# Patient Record
Sex: Female | Born: 1980
Health system: Southern US, Community
[De-identification: ages and names within clinical notes are randomized; demographics above are authoritative.]

## PROBLEM LIST (undated history)

## (undated) ENCOUNTER — Inpatient Hospital Stay (HOSPITAL_COMMUNITY): Payer: Self-pay

## (undated) DIAGNOSIS — E669 Obesity, unspecified: Secondary | ICD-10-CM

## (undated) DIAGNOSIS — Z8614 Personal history of Methicillin resistant Staphylococcus aureus infection: Secondary | ICD-10-CM

## (undated) DIAGNOSIS — B009 Herpesviral infection, unspecified: Secondary | ICD-10-CM

## (undated) DIAGNOSIS — O1490 Unspecified pre-eclampsia, unspecified trimester: Secondary | ICD-10-CM

## (undated) DIAGNOSIS — K589 Irritable bowel syndrome without diarrhea: Secondary | ICD-10-CM

## (undated) DIAGNOSIS — O139 Gestational [pregnancy-induced] hypertension without significant proteinuria, unspecified trimester: Secondary | ICD-10-CM

## (undated) DIAGNOSIS — L739 Follicular disorder, unspecified: Secondary | ICD-10-CM

## (undated) DIAGNOSIS — K219 Gastro-esophageal reflux disease without esophagitis: Secondary | ICD-10-CM

## (undated) DIAGNOSIS — M25539 Pain in unspecified wrist: Secondary | ICD-10-CM

## (undated) DIAGNOSIS — G43009 Migraine without aura, not intractable, without status migrainosus: Secondary | ICD-10-CM

## (undated) DIAGNOSIS — K760 Fatty (change of) liver, not elsewhere classified: Secondary | ICD-10-CM

## (undated) DIAGNOSIS — O4100X0 Oligohydramnios, unspecified trimester, not applicable or unspecified: Secondary | ICD-10-CM

## (undated) HISTORY — DX: Obesity, unspecified: E66.9

## (undated) HISTORY — DX: Unspecified pre-eclampsia, unspecified trimester: O14.90

## (undated) HISTORY — DX: Irritable bowel syndrome, unspecified: K58.9

## (undated) HISTORY — DX: Herpesviral infection, unspecified: B00.9

## (undated) HISTORY — DX: Follicular disorder, unspecified: L73.9

## (undated) HISTORY — PX: SEPTOPLASTY: SUR1290

## (undated) HISTORY — PX: WISDOM TOOTH EXTRACTION: SHX21

## (undated) HISTORY — DX: Migraine without aura, not intractable, without status migrainosus: G43.009

## (undated) HISTORY — DX: Oligohydramnios, unspecified trimester, not applicable or unspecified: O41.00X0

## (undated) HISTORY — DX: Pain in unspecified wrist: M25.539

---

## 2007-06-21 ENCOUNTER — Encounter: Admission: RE | Admit: 2007-06-21 | Discharge: 2007-06-21 | Payer: Self-pay | Admitting: Gastroenterology

## 2007-06-26 ENCOUNTER — Ambulatory Visit (HOSPITAL_COMMUNITY): Admission: RE | Admit: 2007-06-26 | Discharge: 2007-06-26 | Payer: Self-pay | Admitting: Gastroenterology

## 2007-06-27 ENCOUNTER — Emergency Department (HOSPITAL_COMMUNITY): Admission: EM | Admit: 2007-06-27 | Discharge: 2007-06-27 | Payer: Self-pay | Admitting: Emergency Medicine

## 2008-08-22 ENCOUNTER — Encounter: Admission: RE | Admit: 2008-08-22 | Discharge: 2008-08-22 | Payer: Self-pay | Admitting: Family Medicine

## 2009-03-23 DIAGNOSIS — B009 Herpesviral infection, unspecified: Secondary | ICD-10-CM | POA: Insufficient documentation

## 2009-03-23 HISTORY — DX: Herpesviral infection, unspecified: B00.9

## 2009-04-01 DIAGNOSIS — E669 Obesity, unspecified: Secondary | ICD-10-CM | POA: Insufficient documentation

## 2009-04-01 DIAGNOSIS — J45990 Exercise induced bronchospasm: Secondary | ICD-10-CM | POA: Insufficient documentation

## 2009-07-02 ENCOUNTER — Encounter: Admission: RE | Admit: 2009-07-02 | Discharge: 2009-07-02 | Payer: Self-pay | Admitting: Nurse Practitioner

## 2009-07-02 DIAGNOSIS — R635 Abnormal weight gain: Secondary | ICD-10-CM | POA: Insufficient documentation

## 2009-07-16 DIAGNOSIS — R519 Headache, unspecified: Secondary | ICD-10-CM | POA: Insufficient documentation

## 2009-07-23 ENCOUNTER — Encounter: Admission: RE | Admit: 2009-07-23 | Discharge: 2009-07-23 | Payer: Self-pay | Admitting: Family Medicine

## 2009-11-04 DIAGNOSIS — E559 Vitamin D deficiency, unspecified: Secondary | ICD-10-CM | POA: Insufficient documentation

## 2010-02-22 DIAGNOSIS — J45909 Unspecified asthma, uncomplicated: Secondary | ICD-10-CM | POA: Insufficient documentation

## 2010-08-06 LAB — RPR: RPR: NONREACTIVE

## 2010-08-06 LAB — HEPATITIS B SURFACE ANTIGEN: Hepatitis B Surface Ag: NEGATIVE

## 2010-08-06 LAB — GC/CHLAMYDIA PROBE AMP, GENITAL: Chlamydia: NEGATIVE

## 2010-10-21 LAB — URINALYSIS, ROUTINE W REFLEX MICROSCOPIC
Glucose, UA: NEGATIVE
Hgb urine dipstick: NEGATIVE
Ketones, ur: NEGATIVE
Nitrite: NEGATIVE
Protein, ur: NEGATIVE

## 2010-10-21 LAB — POCT PREGNANCY, URINE: Preg Test, Ur: NEGATIVE

## 2011-01-25 DIAGNOSIS — Z8614 Personal history of Methicillin resistant Staphylococcus aureus infection: Secondary | ICD-10-CM

## 2011-01-25 HISTORY — DX: Personal history of Methicillin resistant Staphylococcus aureus infection: Z86.14

## 2011-02-07 LAB — STREP B DNA PROBE: GBS: POSITIVE

## 2011-02-19 ENCOUNTER — Inpatient Hospital Stay (HOSPITAL_COMMUNITY)
Admission: AD | Admit: 2011-02-19 | Discharge: 2011-02-20 | Disposition: A | Discharge status: Home / Self Care | Payer: BC Managed Care – PPO | Source: Ambulatory Visit | Attending: Obstetrics and Gynecology | Admitting: Obstetrics and Gynecology

## 2011-02-19 ENCOUNTER — Inpatient Hospital Stay (HOSPITAL_COMMUNITY): Payer: BC Managed Care – PPO

## 2011-02-19 ENCOUNTER — Encounter (HOSPITAL_COMMUNITY): Payer: Self-pay | Admitting: *Deleted

## 2011-02-19 DIAGNOSIS — R51 Headache: Secondary | ICD-10-CM | POA: Insufficient documentation

## 2011-02-19 DIAGNOSIS — O4100X0 Oligohydramnios, unspecified trimester, not applicable or unspecified: Secondary | ICD-10-CM | POA: Insufficient documentation

## 2011-02-19 DIAGNOSIS — O99891 Other specified diseases and conditions complicating pregnancy: Secondary | ICD-10-CM | POA: Insufficient documentation

## 2011-02-19 DIAGNOSIS — R030 Elevated blood-pressure reading, without diagnosis of hypertension: Secondary | ICD-10-CM | POA: Insufficient documentation

## 2011-02-19 LAB — CBC
Hemoglobin: 11.9 g/dL — ABNORMAL LOW (ref 12.0–15.0)
MCHC: 34.5 g/dL (ref 30.0–36.0)
MCV: 91.5 fL (ref 78.0–100.0)
Platelets: 174 10*3/uL (ref 150–400)
WBC: 9.7 10*3/uL (ref 4.0–10.5)

## 2011-02-19 LAB — COMPREHENSIVE METABOLIC PANEL
ALT: 37 U/L — ABNORMAL HIGH (ref 0–35)
AST: 34 U/L (ref 0–37)
Albumin: 2.3 g/dL — ABNORMAL LOW (ref 3.5–5.2)
BUN: 7 mg/dL (ref 6–23)
Calcium: 8.6 mg/dL (ref 8.4–10.5)
Chloride: 104 mEq/L (ref 96–112)
GFR calc Af Amer: >90 mL/min (ref >90)
GFR calc non Af Amer: >90 mL/min (ref >90)
Glucose, Bld: 118 mg/dL — ABNORMAL HIGH (ref 70–99)
Sodium: 134 mEq/L — ABNORMAL LOW (ref 135–145)

## 2011-02-19 LAB — URINALYSIS, ROUTINE W REFLEX MICROSCOPIC
Glucose, UA: NEGATIVE mg/dL
Hgb urine dipstick: NEGATIVE
Nitrite: NEGATIVE
Specific Gravity, Urine: <1.005 — ABNORMAL LOW (ref 1.005–1.030)
Urobilinogen, UA: 0.2 mg/dL (ref 0.0–1.0)
pH: 5.5 (ref 5.0–8.0)

## 2011-02-19 MED ORDER — GI COCKTAIL ~~LOC~~
30.0000 mL | Freq: Once | ORAL | Status: AC
  Administered 2011-02-19: 30 mL via ORAL
  Filled 2011-02-19: qty 30

## 2011-02-19 MED ORDER — ACETAMINOPHEN-CODEINE #3 300-30 MG PO TABS
1.0000 | ORAL_TABLET | Freq: Once | ORAL | Status: AC
  Administered 2011-02-19: 1 via ORAL
  Filled 2011-02-19: qty 1

## 2011-02-19 NOTE — ED Notes (Signed)
W.Muhammed CNM notified of pt's admission and status. Will see pt 

## 2011-02-19 NOTE — Progress Notes (Signed)
To L side per pt request. States GI cocktail helped alittle.

## 2011-02-19 NOTE — Progress Notes (Signed)
At 2100 there was a fetal decel to the 80's lasting approximately two minutes; resolved with placing pt on right side.

## 2011-02-19 NOTE — Progress Notes (Signed)
Up to BR. Threasa Heads CNM in to discuss lab results. Will get BPP.

## 2011-02-19 NOTE — Progress Notes (Signed)
Back to semifowlers. Caused pain on R side when lying on R side and difficult monitoring FHR on L side.

## 2011-02-19 NOTE — Progress Notes (Signed)
To us via wc.

## 2011-02-19 NOTE — Progress Notes (Signed)
Pt states, " I got a headache mid morning and it gradually got worse. I thought my pressure might be up and when EMS checked, it was 152/98 at 6:30 pm. I have been swelling more for the past few days. The office has been watching me for pre eclampsia."

## 2011-02-19 NOTE — ED Provider Notes (Signed)
History     Chief Complaint  Patient presents with  . Headache  . Hypertension   HPI Pt states, " I got a headache mid morning and it gradually got worse. I thought my pressure might be up and when EMS checked, it was 152/98 at 6:30 pm. I have been swelling more for the past few days. The office has been watching me for pre eclampsia." Also reports right sided epigastric pain that increased in intensity over past three days.  +nausea.  Intermittent contractions, with no report of vaginal bleeding or leaking of fluid.     Past Medical History  Diagnosis Date  . Asthma     History reviewed. No pertinent past surgical history.  Family History  Problem Relation Age of Onset  . Anesthesia problems Neg Hx   . Malignant hyperthermia Neg Hx   . Pseudochol deficiency Neg Hx   . Hypotension Neg Hx     History  Substance Use Topics  . Smoking status: Never Smoker   . Smokeless tobacco: Not on file  . Alcohol Use: No    Allergies: Allergies not on file  No prescriptions prior to admission    Review of Systems  Constitutional: Negative.   Respiratory: Negative.   Cardiovascular: Negative.   Gastrointestinal: Positive for nausea and abdominal pain ( right epigastric; intermittent contractions). Negative for vomiting, diarrhea and constipation.  Neurological: Headaches:  frontal.   Physical Exam   Blood pressure 123/72, pulse 79, temperature 98.5 F (36.9 C), temperature source Oral, resp. rate 20, height 5\' 7"  (1.702 m), weight 122.981 kg (271 lb 2 oz), SpO2 97.00%.  Physical Exam  Constitutional: She is oriented to person, place, and time. She appears well-developed and well-nourished. No distress.  HENT:  Head: Normocephalic.  Eyes: Pupils are equal, round, and reactive to light.  Neck: Normal range of motion. Neck supple.  Cardiovascular: Normal rate, regular rhythm and normal heart sounds.   Respiratory: Effort normal and breath sounds normal.  Genitourinary: No  bleeding around the vagina. Vaginal discharge (mucusy) found.       Cervix 1/50/-3  Musculoskeletal:       2+ pedal edema  Neurological: She is alert and oriented to person, place, and time. She has normal reflexes. She displays normal reflexes.  Skin: Skin is warm and dry.  FHR 120's, +accel, reactive Toco - irregular  MAU Course  Procedures  Results for orders placed during the hospital encounter of 02/19/11 (from the past 24 hour(s))  URINALYSIS, ROUTINE W REFLEX MICROSCOPIC     Status: Abnormal   Collection Time   02/19/11  8:16 PM      Component Value Range   Color, Urine YELLOW  YELLOW    APPearance CLEAR  CLEAR    Specific Gravity, Urine <1.005 (*) 1.005 - 1.030    pH 5.5  5.0 - 8.0    Glucose, UA NEGATIVE  NEGATIVE (mg/dL)   Hgb urine dipstick NEGATIVE  NEGATIVE    Bilirubin Urine NEGATIVE  NEGATIVE    Ketones, ur NEGATIVE  NEGATIVE (mg/dL)   Protein, ur NEGATIVE  NEGATIVE (mg/dL)   Urobilinogen, UA 0.2  0.0 - 1.0 (mg/dL)   Nitrite NEGATIVE  NEGATIVE    Leukocytes, UA NEGATIVE  NEGATIVE   CBC     Status: Abnormal   Collection Time   02/19/11  9:15 PM      Component Value Range   WBC 9.7  4.0 - 10.5 (K/uL)   RBC 3.77 (*) 3.87 -  5.11 (MIL/uL)   Hemoglobin 11.9 (*) 12.0 - 15.0 (g/dL)   HCT 81.1 (*) 91.4 - 46.0 (%)   MCV 91.5  78.0 - 100.0 (fL)   MCH 31.6  26.0 - 34.0 (pg)   MCHC 34.5  30.0 - 36.0 (g/dL)   RDW 78.2  95.6 - 21.3 (%)   Platelets 174  150 - 400 (K/uL)  COMPREHENSIVE METABOLIC PANEL     Status: Abnormal   Collection Time   02/19/11  9:15 PM      Component Value Range   Sodium 134 (*) 135 - 145 (mEq/L)   Potassium 3.8  3.5 - 5.1 (mEq/L)   Chloride 104  96 - 112 (mEq/L)   CO2 23  19 - 32 (mEq/L)   Glucose, Bld 118 (*) 70 - 99 (mg/dL)   BUN 7  6 - 23 (mg/dL)   Creatinine, Ser 0.86  0.50 - 1.10 (mg/dL)   Calcium 8.6  8.4 - 57.8 (mg/dL)   Total Protein 5.5 (*) 6.0 - 8.3 (g/dL)   Albumin 2.3 (*) 3.5 - 5.2 (g/dL)   AST 34  0 - 37 (U/L)   ALT 37 (*) 0 -  35 (U/L)   Alkaline Phosphatase 172 (*) 39 - 117 (U/L)   Total Bilirubin 0.4  0.3 - 1.2 (mg/dL)   GFR calc non Af Amer >90  >90 (mL/min)   GFR calc Af Amer >90  >90 (mL/min)   Ultrasound: BPP 8/8 AFI 6.87  Consulted with Dr. Marcelle Overlie, reviewed HPI/exam/US results>DC home with follow-up on Monday.  Assessment and Plan  Oligohydramnios  Plan: DC home PIH precautions Keep scheduled appt for Monday  [facility] 02/19/2011, 8:40 PM

## 2011-02-19 NOTE — Consult Note (Signed)
Reviewed HPI/labs/exam/fetal tracing>obtain BPP and give Tylenol Extra Strength x 2

## 2011-02-19 NOTE — Progress Notes (Signed)
Pt was flatter for sve and had variable down to 90s. To R side and FHR return to baseline

## 2011-02-19 NOTE — ED Notes (Signed)
Pt states she had blood work Thurs due to elevated B/Ps. Received call from office Friday telling her platelets low and liver enzymes elevated. Having some pain R upper quad that radiates around to back. States had a lot of n/v last wk but ok this wk. Some loose stools today. Some blurry vision at times. Frontal h/a. Took Tylenol 500mg  2 tabs about 1800 which didn't help.

## 2011-02-20 NOTE — Progress Notes (Signed)
Threasa Heads CNM in to discuss lab and u/s results with pt. And d/c plan home.

## 2011-02-23 ENCOUNTER — Encounter (HOSPITAL_COMMUNITY): Payer: Self-pay

## 2011-02-23 ENCOUNTER — Inpatient Hospital Stay (HOSPITAL_COMMUNITY)
Admission: RE | Admit: 2011-02-23 | Discharge: 2011-02-28 | DRG: 372 | Disposition: A | Discharge status: Home / Self Care | Payer: BC Managed Care – PPO | Source: Ambulatory Visit | Attending: Obstetrics and Gynecology | Admitting: Obstetrics and Gynecology

## 2011-02-23 DIAGNOSIS — R209 Unspecified disturbances of skin sensation: Secondary | ICD-10-CM | POA: Diagnosis not present

## 2011-02-23 DIAGNOSIS — O1414 Severe pre-eclampsia complicating childbirth: Principal | ICD-10-CM | POA: Diagnosis present

## 2011-02-23 DIAGNOSIS — O99893 Other specified diseases and conditions complicating puerperium: Secondary | ICD-10-CM | POA: Diagnosis not present

## 2011-02-23 HISTORY — DX: Gestational (pregnancy-induced) hypertension without significant proteinuria, unspecified trimester: O13.9

## 2011-02-23 LAB — CBC
HCT: 35.6 % — ABNORMAL LOW (ref 36.0–46.0)
MCHC: 35.1 g/dL (ref 30.0–36.0)
Platelets: 188 10*3/uL (ref 150–400)
RDW: 13.1 % (ref 11.5–15.5)
WBC: 11.5 10*3/uL — ABNORMAL HIGH (ref 4.0–10.5)

## 2011-02-23 LAB — COMPREHENSIVE METABOLIC PANEL
ALT: 44 U/L — ABNORMAL HIGH (ref 0–35)
AST: 39 U/L — ABNORMAL HIGH (ref 0–37)
Albumin: 2.4 g/dL — ABNORMAL LOW (ref 3.5–5.2)
Alkaline Phosphatase: 168 U/L — ABNORMAL HIGH (ref 39–117)
Calcium: 8.4 mg/dL (ref 8.4–10.5)
GFR calc Af Amer: >90 mL/min (ref >90)
Potassium: 3.5 mEq/L (ref 3.5–5.1)
Sodium: 133 mEq/L — ABNORMAL LOW (ref 135–145)
Total Protein: 5.8 g/dL — ABNORMAL LOW (ref 6.0–8.3)

## 2011-02-23 MED ORDER — LACTATED RINGERS IV SOLN
INTRAVENOUS | Status: DC
  Administered 2011-02-23 – 2011-02-24 (×2): via INTRAVENOUS

## 2011-02-23 MED ORDER — OXYTOCIN BOLUS FROM INFUSION
500.0000 mL | Freq: Once | INTRAVENOUS | Status: DC
  Filled 2011-02-23: qty 1000
  Filled 2011-02-23: qty 500
  Filled 2011-02-23: qty 1000

## 2011-02-23 MED ORDER — MISOPROSTOL 25 MCG QUARTER TABLET
25.0000 ug | ORAL_TABLET | ORAL | Status: DC | PRN
  Administered 2011-02-23 – 2011-02-24 (×4): 25 ug via VAGINAL
  Filled 2011-02-23 (×5): qty 0.25

## 2011-02-23 MED ORDER — TERBUTALINE SULFATE 1 MG/ML IJ SOLN: 0.2500 mg | Freq: Once | INTRAMUSCULAR | Status: AC | PRN

## 2011-02-23 MED ORDER — PENICILLIN G POTASSIUM 5000000 UNITS IJ SOLR
5.0000 10*6.[IU] | Freq: Once | INTRAVENOUS | Status: AC
  Administered 2011-02-24: 5 10*6.[IU] via INTRAVENOUS
  Filled 2011-02-23: qty 5

## 2011-02-23 MED ORDER — PENICILLIN G POTASSIUM 5000000 UNITS IJ SOLR
2.5000 10*6.[IU] | INTRAVENOUS | Status: DC
  Filled 2011-02-23 (×2): qty 2.5

## 2011-02-23 MED ORDER — FLEET ENEMA 7-19 GM/118ML RE ENEM: 1.0000 | ENEMA | RECTAL | Status: DC | PRN

## 2011-02-23 MED ORDER — FLUTICASONE PROPIONATE 50 MCG/ACT NA SUSP
2.0000 | Freq: Every day | NASAL | Status: DC
  Administered 2011-02-23 – 2011-02-24 (×2): 2 via NASAL
  Filled 2011-02-23: qty 16

## 2011-02-23 MED ORDER — OXYCODONE-ACETAMINOPHEN 5-325 MG PO TABS: 1.0000 | ORAL_TABLET | ORAL | Status: DC | PRN

## 2011-02-23 MED ORDER — OXYTOCIN 20 UNITS IN LACTATED RINGERS INFUSION - SIMPLE
125.0000 mL/h | Freq: Once | INTRAVENOUS | Status: AC
  Administered 2011-02-25: 999 mL/h via INTRAVENOUS

## 2011-02-23 MED ORDER — LIDOCAINE HCL (PF) 1 % IJ SOLN
30.0000 mL | INTRAMUSCULAR | Status: DC | PRN
  Filled 2011-02-23: qty 30

## 2011-02-23 MED ORDER — ACETAMINOPHEN 325 MG PO TABS
650.0000 mg | ORAL_TABLET | ORAL | Status: DC | PRN
  Administered 2011-02-23 – 2011-02-24 (×2): 650 mg via ORAL
  Filled 2011-02-23 (×2): qty 2

## 2011-02-23 MED ORDER — MONTELUKAST SODIUM 10 MG PO TABS
10.0000 mg | ORAL_TABLET | Freq: Every day | ORAL | Status: DC
  Administered 2011-02-23 – 2011-02-24 (×2): 10 mg via ORAL
  Filled 2011-02-23 (×3): qty 1

## 2011-02-23 MED ORDER — IBUPROFEN 600 MG PO TABS: 600.0000 mg | ORAL_TABLET | Freq: Four times a day (QID) | ORAL | Status: DC | PRN

## 2011-02-23 MED ORDER — ONDANSETRON HCL 4 MG/2ML IJ SOLN: 4.0000 mg | Freq: Four times a day (QID) | INTRAMUSCULAR | Status: DC | PRN

## 2011-02-23 MED ORDER — MAGNESIUM SULFATE BOLUS VIA INFUSION
4.0000 g | Freq: Once | INTRAVENOUS | Status: AC
  Administered 2011-02-23: 4 g via INTRAVENOUS
  Filled 2011-02-23: qty 500

## 2011-02-23 MED ORDER — PENICILLIN G POTASSIUM 5000000 UNITS IJ SOLR
2.5000 10*6.[IU] | INTRAVENOUS | Status: DC
  Administered 2011-02-24 – 2011-02-25 (×7): 2.5 10*6.[IU] via INTRAVENOUS
  Filled 2011-02-23 (×11): qty 2.5

## 2011-02-23 MED ORDER — CITRIC ACID-SODIUM CITRATE 334-500 MG/5ML PO SOLN: 30.0000 mL | ORAL | Status: DC | PRN

## 2011-02-23 MED ORDER — LACTATED RINGERS IV SOLN: 500.0000 mL | INTRAVENOUS | Status: DC | PRN

## 2011-02-23 MED ORDER — MAGNESIUM SULFATE 40 G IN LACTATED RINGERS - SIMPLE
2.0000 g/h | INTRAVENOUS | Status: DC
  Administered 2011-02-24 – 2011-02-25 (×2): 2 g/h via INTRAVENOUS
  Filled 2011-02-23 (×3): qty 500

## 2011-02-23 MED ORDER — FLUTICASONE-SALMETEROL 100-50 MCG/DOSE IN AEPB
1.0000 | INHALATION_SPRAY | Freq: Two times a day (BID) | RESPIRATORY_TRACT | Status: DC
  Administered 2011-02-23 – 2011-02-24 (×2): 1 via RESPIRATORY_TRACT
  Filled 2011-02-23: qty 14

## 2011-02-23 MED ORDER — PENICILLIN G POTASSIUM 5000000 UNITS IJ SOLR
5.0000 10*6.[IU] | Freq: Once | INTRAVENOUS | Status: DC
  Filled 2011-02-23: qty 5

## 2011-02-23 NOTE — Plan of Care (Signed)
Problem: Consults Goal: Birthing Suites Patient Information Press F2 to bring up selections list  Outcome: Completed/Met Date Met:  02/23/11  Pt 37-[redacted] weeks EGA, Inpatient induction and PIH (Pregnancy induced hypertension)

## 2011-02-23 NOTE — H&P (Signed)
31 yo G1 @ 38+2 wks presents for IOL.  Pt has been followed in office with elevated BP and on home bedrest x 2wks.  Today, she presented with severe HA, nausea, and not feeling well.  + FM.  No ctx, vb, or LOF.  Past History - see hollister, GBS neg Gen - NAD CV - RRR Lungs - clear bilaterally Abd - gravid, nt Cvx closed Ext - 1+ edema bilaterally Neuro - 2+ DTR, no clonus  Labs - elevated LFTs, o/w wnl  A/P:  Severe Pre-eclampsia, HELLP syndrome Admit Magnesium prophylaxis PCN prophylaxis Cytotec induction

## 2011-02-24 LAB — COMPREHENSIVE METABOLIC PANEL
ALT: 41 U/L — ABNORMAL HIGH (ref 0–35)
AST: 39 U/L — ABNORMAL HIGH (ref 0–37)
CO2: 25 mEq/L (ref 19–32)
Calcium: 7.7 mg/dL — ABNORMAL LOW (ref 8.4–10.5)
Chloride: 102 mEq/L (ref 96–112)
Creatinine, Ser: 0.62 mg/dL (ref 0.50–1.10)
GFR calc Af Amer: >90 mL/min (ref >90)
GFR calc non Af Amer: >90 mL/min (ref >90)
Glucose, Bld: 110 mg/dL — ABNORMAL HIGH (ref 70–99)
Total Bilirubin: 0.5 mg/dL (ref 0.3–1.2)

## 2011-02-24 LAB — URIC ACID: Uric Acid, Serum: 3.6 mg/dL (ref 2.4–7.0)

## 2011-02-24 LAB — CBC
HCT: 37.3 % (ref 36.0–46.0)
Hemoglobin: 13 g/dL (ref 12.0–15.0)
MCHC: 34.9 g/dL (ref 30.0–36.0)
MCV: 91.2 fL (ref 78.0–100.0)
RDW: 13.1 % (ref 11.5–15.5)

## 2011-02-24 LAB — RPR: RPR Ser Ql: NONREACTIVE

## 2011-02-24 MED ORDER — PROMETHAZINE HCL 25 MG/ML IJ SOLN
12.5000 mg | Freq: Four times a day (QID) | INTRAMUSCULAR | Status: DC | PRN
  Administered 2011-02-24: 12.5 mg via INTRAVENOUS
  Filled 2011-02-24: qty 1

## 2011-02-24 MED ORDER — BUTORPHANOL TARTRATE 2 MG/ML IJ SOLN
2.0000 mg | INTRAMUSCULAR | Status: DC | PRN
  Administered 2011-02-24: 2 mg via INTRAVENOUS
  Filled 2011-02-24: qty 1

## 2011-02-24 MED ORDER — PRENATAL MULTIVITAMIN CH
1.0000 | ORAL_TABLET | Freq: Every day | ORAL | Status: DC
  Administered 2011-02-24: 1 via ORAL
  Filled 2011-02-24: qty 1

## 2011-02-24 MED ORDER — TERBUTALINE SULFATE 1 MG/ML IJ SOLN: 0.2500 mg | Freq: Once | INTRAMUSCULAR | Status: AC | PRN

## 2011-02-24 MED ORDER — MISOPROSTOL 25 MCG QUARTER TABLET: 25.0000 ug | ORAL_TABLET | ORAL | Status: DC

## 2011-02-24 MED ORDER — OXYTOCIN 20 UNITS IN LACTATED RINGERS INFUSION - SIMPLE
1.0000 m[IU]/min | INTRAVENOUS | Status: DC
  Administered 2011-02-24: 1 m[IU]/min via INTRAVENOUS

## 2011-02-24 MED ORDER — ZOLPIDEM TARTRATE 10 MG PO TABS
10.0000 mg | ORAL_TABLET | Freq: Every evening | ORAL | Status: DC | PRN
  Administered 2011-02-24: 10 mg via ORAL
  Filled 2011-02-24: qty 1

## 2011-02-24 MED ORDER — MISOPROSTOL 25 MCG QUARTER TABLET
25.0000 ug | ORAL_TABLET | ORAL | Status: DC
  Administered 2011-02-25: 25 ug via VAGINAL

## 2011-02-24 NOTE — Progress Notes (Signed)
Patient ID: Sonia Martinez, female   DOB: [birth year], 31 y.o.   MRN: [id number] Pt without complaints No PIH sxs BP 126/71 DTRs 1/4 1+ edema FHR 140s +accels Ctxs irreg mild Cx at 5am with cytotec 1/50/-3  Continue induction for PIH Pitocin today Recheck Labs Stable on MgSo4 DL

## 2011-02-25 ENCOUNTER — Encounter (HOSPITAL_COMMUNITY): Payer: Self-pay

## 2011-02-25 ENCOUNTER — Encounter (HOSPITAL_COMMUNITY): Payer: Self-pay | Admitting: Anesthesiology

## 2011-02-25 ENCOUNTER — Inpatient Hospital Stay (HOSPITAL_COMMUNITY): Payer: BC Managed Care – PPO | Admitting: Anesthesiology

## 2011-02-25 LAB — CBC
MCH: 31.8 pg (ref 26.0–34.0)
MCHC: 34.7 g/dL (ref 30.0–36.0)
Platelets: 195 10*3/uL (ref 150–400)
RDW: 13.2 % (ref 11.5–15.5)

## 2011-02-25 LAB — COMPREHENSIVE METABOLIC PANEL
Albumin: 2.4 g/dL — ABNORMAL LOW (ref 3.5–5.2)
BUN: 6 mg/dL (ref 6–23)
Creatinine, Ser: 0.67 mg/dL (ref 0.50–1.10)
GFR calc Af Amer: >90 mL/min (ref >90)
Glucose, Bld: 88 mg/dL (ref 70–99)
Total Protein: 6 g/dL (ref 6.0–8.3)

## 2011-02-25 LAB — LACTATE DEHYDROGENASE: LDH: 218 U/L (ref 94–250)

## 2011-02-25 MED ORDER — EPHEDRINE 5 MG/ML INJ
10.0000 mg | INTRAVENOUS | Status: DC | PRN
  Administered 2011-02-25: 10 mg via INTRAVENOUS
  Filled 2011-02-25: qty 4

## 2011-02-25 MED ORDER — DIBUCAINE 1 % RE OINT: 1.0000 "application " | TOPICAL_OINTMENT | RECTAL | Status: DC | PRN

## 2011-02-25 MED ORDER — MONTELUKAST SODIUM 10 MG PO TABS
10.0000 mg | ORAL_TABLET | Freq: Every day | ORAL | Status: DC
  Administered 2011-02-25 – 2011-02-27 (×3): 10 mg via ORAL
  Filled 2011-02-25 (×3): qty 1

## 2011-02-25 MED ORDER — MAGNESIUM SULFATE 40 G IN LACTATED RINGERS - SIMPLE
2.0000 g/h | INTRAVENOUS | Status: AC
  Filled 2011-02-25: qty 500

## 2011-02-25 MED ORDER — PHENYLEPHRINE 40 MCG/ML (10ML) SYRINGE FOR IV PUSH (FOR BLOOD PRESSURE SUPPORT): 80.0000 ug | PREFILLED_SYRINGE | INTRAVENOUS | Status: DC | PRN

## 2011-02-25 MED ORDER — ONDANSETRON HCL 4 MG PO TABS: 4.0000 mg | ORAL_TABLET | ORAL | Status: DC | PRN

## 2011-02-25 MED ORDER — BENZOCAINE-MENTHOL 20-0.5 % EX AERO
1.0000 "application " | INHALATION_SPRAY | CUTANEOUS | Status: DC | PRN
  Administered 2011-02-25: 1 via TOPICAL

## 2011-02-25 MED ORDER — SIMETHICONE 80 MG PO CHEW: 80.0000 mg | CHEWABLE_TABLET | ORAL | Status: DC | PRN

## 2011-02-25 MED ORDER — EPHEDRINE 5 MG/ML INJ: 10.0000 mg | INTRAVENOUS | Status: DC | PRN

## 2011-02-25 MED ORDER — BENZOCAINE-MENTHOL 20-0.5 % EX AERO
INHALATION_SPRAY | CUTANEOUS | Status: AC
  Administered 2011-02-25: 1 via TOPICAL
  Filled 2011-02-25: qty 56

## 2011-02-25 MED ORDER — FLUTICASONE-SALMETEROL 100-50 MCG/DOSE IN AEPB
1.0000 | INHALATION_SPRAY | Freq: Two times a day (BID) | RESPIRATORY_TRACT | Status: DC
  Administered 2011-02-25 – 2011-02-27 (×3): 1 via RESPIRATORY_TRACT
  Filled 2011-02-25: qty 14

## 2011-02-25 MED ORDER — FLEET ENEMA 7-19 GM/118ML RE ENEM: 1.0000 | ENEMA | Freq: Every day | RECTAL | Status: DC | PRN

## 2011-02-25 MED ORDER — FENTANYL 2.5 MCG/ML BUPIVACAINE 1/10 % EPIDURAL INFUSION (WH - ANES)
14.0000 mL/h | INTRAMUSCULAR | Status: DC
  Administered 2011-02-25 (×2): 14 mL/h via EPIDURAL
  Filled 2011-02-25 (×2): qty 60

## 2011-02-25 MED ORDER — SENNOSIDES-DOCUSATE SODIUM 8.6-50 MG PO TABS
2.0000 | ORAL_TABLET | Freq: Every day | ORAL | Status: DC
  Administered 2011-02-25 – 2011-02-27 (×3): 2 via ORAL

## 2011-02-25 MED ORDER — IBUPROFEN 600 MG PO TABS
600.0000 mg | ORAL_TABLET | Freq: Four times a day (QID) | ORAL | Status: DC
  Administered 2011-02-25 – 2011-02-28 (×8): 600 mg via ORAL
  Filled 2011-02-25 (×8): qty 1

## 2011-02-25 MED ORDER — LANOLIN HYDROUS EX OINT: TOPICAL_OINTMENT | CUTANEOUS | Status: DC | PRN

## 2011-02-25 MED ORDER — ONDANSETRON HCL 4 MG/2ML IJ SOLN: 4.0000 mg | INTRAMUSCULAR | Status: DC | PRN

## 2011-02-25 MED ORDER — OXYCODONE-ACETAMINOPHEN 5-325 MG PO TABS
1.0000 | ORAL_TABLET | ORAL | Status: DC | PRN
  Filled 2011-02-25: qty 1

## 2011-02-25 MED ORDER — FLUTICASONE PROPIONATE 50 MCG/ACT NA SUSP
2.0000 | Freq: Every day | NASAL | Status: DC
  Administered 2011-02-26 – 2011-02-27 (×2): 2 via NASAL
  Filled 2011-02-25: qty 16

## 2011-02-25 MED ORDER — TETANUS-DIPHTH-ACELL PERTUSSIS 5-2.5-18.5 LF-MCG/0.5 IM SUSP
0.5000 mL | Freq: Once | INTRAMUSCULAR | Status: DC
  Filled 2011-02-25: qty 0.5

## 2011-02-25 MED ORDER — DIPHENHYDRAMINE HCL 50 MG/ML IJ SOLN: 12.5000 mg | INTRAMUSCULAR | Status: DC | PRN

## 2011-02-25 MED ORDER — PHENYLEPHRINE 40 MCG/ML (10ML) SYRINGE FOR IV PUSH (FOR BLOOD PRESSURE SUPPORT)
80.0000 ug | PREFILLED_SYRINGE | INTRAVENOUS | Status: DC | PRN
  Filled 2011-02-25: qty 5

## 2011-02-25 MED ORDER — LACTATED RINGERS IV SOLN
500.0000 mL | Freq: Once | INTRAVENOUS | Status: AC
  Administered 2011-02-25: 500 mL via INTRAVENOUS

## 2011-02-25 MED ORDER — DIPHENHYDRAMINE HCL 25 MG PO CAPS: 25.0000 mg | ORAL_CAPSULE | Freq: Four times a day (QID) | ORAL | Status: DC | PRN

## 2011-02-25 MED ORDER — ZOLPIDEM TARTRATE 5 MG PO TABS
5.0000 mg | ORAL_TABLET | Freq: Every evening | ORAL | Status: DC | PRN
  Administered 2011-02-27: 5 mg via ORAL
  Filled 2011-02-25: qty 1

## 2011-02-25 MED ORDER — LIDOCAINE HCL 1.5 % IJ SOLN
INTRAMUSCULAR | Status: DC | PRN
  Administered 2011-02-25 (×3): 4 mL via INTRADERMAL

## 2011-02-25 MED ORDER — BISACODYL 10 MG RE SUPP
10.0000 mg | Freq: Every day | RECTAL | Status: DC | PRN
  Filled 2011-02-25: qty 1

## 2011-02-25 MED ORDER — PRENATAL MULTIVITAMIN CH
1.0000 | ORAL_TABLET | Freq: Every day | ORAL | Status: DC
  Filled 2011-02-25 (×2): qty 1

## 2011-02-25 MED ORDER — WITCH HAZEL-GLYCERIN EX PADS: 1.0000 "application " | MEDICATED_PAD | CUTANEOUS | Status: DC | PRN

## 2011-02-25 NOTE — Progress Notes (Signed)
Delivery Note  Rapid second stage FHT reactive Rt sulcus lac noted, vtx crowning Small first degree MLE SVD VFI  Apgars 8/8, art pH pending Placenta intact, 3 vessels to path EBL  600 cc Rt sulcus tear and MLE repaired Pt infant stable in LDR Will continue PP magnesium prophylaxis

## 2011-02-25 NOTE — Anesthesia Preprocedure Evaluation (Signed)
Anesthesia Evaluation  Patient identified by MRN, date of birth, ID band Patient awake    Reviewed: Allergy & Precautions, H&P , NPO status , Patient's Chart, lab work & pertinent test results, reviewed documented beta blocker date and time   History of Anesthesia Complications Negative for: history of anesthetic complications  Airway Mallampati: III TM Distance: >3 FB Neck ROM: full    Dental  (+) Teeth Intact   Pulmonary asthma (daily inhaler use) ,  clear to auscultation        Cardiovascular hypertension (PIH, on magnesium), regular Normal    Neuro/Psych PSYCHIATRIC DISORDERS (anxiety) Negative Neurological ROS     GI/Hepatic Neg liver ROS, GERD-  Medicated,  Endo/Other  Morbid obesity  Renal/GU negative Renal ROS  Genitourinary negative   Musculoskeletal   Abdominal   Peds  Hematology negative hematology ROS (+)   Anesthesia Other Findings   Reproductive/Obstetrics (+) Pregnancy                           Anesthesia Physical Anesthesia Plan  ASA: III  Anesthesia Plan: Epidural   Post-op Pain Management:    Induction:   Airway Management Planned:   Additional Equipment:   Intra-op Plan:   Post-operative Plan:   Informed Consent: I have reviewed the patients History and Physical, chart, labs and discussed the procedure including the risks, benefits and alternatives for the proposed anesthesia with the patient or authorized representative who has indicated his/her understanding and acceptance.     Plan Discussed with:   Anesthesia Plan Comments:         Anesthesia Quick Evaluation

## 2011-02-25 NOTE — Progress Notes (Signed)
Pt c/o pain with UCs SROM clear about 6:30 am  BPs stable Lungs CTA Cor RRR DTR  1+  Magnesium Sulfate running  Results for orders placed during the hospital encounter of 02/23/11 (from the past 24 hour(s))  CBC     Status: Abnormal   Collection Time   02/24/11  9:45 AM      Component Value Range   WBC 10.8 (*) 4.0 - 10.5 (K/uL)   RBC 4.09  3.87 - 5.11 (MIL/uL)   Hemoglobin 13.0  12.0 - 15.0 (g/dL)   HCT 16.1  09.6 - 04.5 (%)   MCV 91.2  78.0 - 100.0 (fL)   MCH 31.8  26.0 - 34.0 (pg)   MCHC 34.9  30.0 - 36.0 (g/dL)   RDW 40.9  81.1 - 91.4 (%)   Platelets 178  150 - 400 (K/uL)  LACTATE DEHYDROGENASE     Status: Abnormal   Collection Time   02/24/11  9:45 AM      Component Value Range   LD 262 (*) 94 - 250 (U/L)  URIC ACID     Status: Normal   Collection Time   02/24/11  9:45 AM      Component Value Range   Uric Acid, Serum 3.6  2.4 - 7.0 (mg/dL)  COMPREHENSIVE METABOLIC PANEL     Status: Abnormal   Collection Time   02/24/11  9:45 AM      Component Value Range   Sodium 133 (*) 135 - 145 (mEq/L)   Potassium 3.9  3.5 - 5.1 (mEq/L)   Chloride 102  96 - 112 (mEq/L)   CO2 25  19 - 32 (mEq/L)   Glucose, Bld 110 (*) 70 - 99 (mg/dL)   BUN 5 (*) 6 - 23 (mg/dL)   Creatinine, Ser 7.82  0.50 - 1.10 (mg/dL)   Calcium 7.7 (*) 8.4 - 10.5 (mg/dL)   Total Protein 6.1  6.0 - 8.3 (g/dL)   Albumin 2.5 (*) 3.5 - 5.2 (g/dL)   AST 39 (*) 0 - 37 (U/L)   ALT 41 (*) 0 - 35 (U/L)   Alkaline Phosphatase 175 (*) 39 - 117 (U/L)   Total Bilirubin 0.5  0.3 - 1.2 (mg/dL)   GFR calc non Af Amer >90  >90 (mL/min)   GFR calc Af Amer >90  >90 (mL/min)  CBC     Status: Abnormal   Collection Time   02/25/11  5:10 AM      Component Value Range   WBC 11.1 (*) 4.0 - 10.5 (K/uL)   RBC 4.00  3.87 - 5.11 (MIL/uL)   Hemoglobin 12.7  12.0 - 15.0 (g/dL)   HCT 95.6  21.3 - 08.6 (%)   MCV 91.5  78.0 - 100.0 (fL)   MCH 31.8  26.0 - 34.0 (pg)   MCHC 34.7  30.0 - 36.0 (g/dL)   RDW 57.8  46.9 - 62.9 (%)   Platelets 195  150 - 400 (K/uL)  LACTATE DEHYDROGENASE     Status: Normal   Collection Time   02/25/11  5:10 AM      Component Value Range   LD 218  94 - 250 (U/L)  URIC ACID     Status: Normal   Collection Time   02/25/11  5:10 AM      Component Value Range   Uric Acid, Serum 3.8  2.4 - 7.0 (mg/dL)  COMPREHENSIVE METABOLIC PANEL     Status: Abnormal   Collection Time   02/25/11  5:10 AM      Component Value Range   Sodium 133 (*) 135 - 145 (mEq/L)   Potassium 3.8  3.5 - 5.1 (mEq/L)   Chloride 101  96 - 112 (mEq/L)   CO2 25  19 - 32 (mEq/L)   Glucose, Bld 88  70 - 99 (mg/dL)   BUN 6  6 - 23 (mg/dL)   Creatinine, Ser 1.61  0.50 - 1.10 (mg/dL)   Calcium 7.4 (*) 8.4 - 10.5 (mg/dL)   Total Protein 6.0  6.0 - 8.3 (g/dL)   Albumin 2.4 (*) 3.5 - 5.2 (g/dL)   AST 40 (*) 0 - 37 (U/L)   ALT 42 (*) 0 - 35 (U/L)   Alkaline Phosphatase 186 (*) 39 - 117 (U/L)   Total Bilirubin 0.8  0.3 - 1.2 (mg/dL)   GFR calc non Af Amer >90  >90 (mL/min)   GFR calc Af Amer >90  >90 (mL/min)   FHT reactive UCs about q41min Cx 3/C/-2/vtx  A: preeclampsia     Entering active phase of labor  P: Epidural prn     Continue Magnesium Sulfate for sz prophylaxis

## 2011-02-25 NOTE — Anesthesia Procedure Notes (Signed)
Epidural Patient location during procedure: OB Start time: 02/25/2011 8:44 AM Reason for block: procedure for pain  Staffing Performed by: anesthesiologist   Preanesthetic Checklist Completed: patient identified, site marked, surgical consent, pre-op evaluation, timeout performed, IV checked, risks and benefits discussed and monitors and equipment checked  Epidural Patient position: sitting Prep: site prepped and draped and DuraPrep Patient monitoring: continuous pulse ox and blood pressure Approach: midline Injection technique: LOR air  Needle:  Needle type: Tuohy  Needle gauge: 17 G Needle length: 9 cm Needle insertion depth: 6 cm Catheter type: closed end flexible Catheter size: 19 Gauge Catheter at skin depth: 11 cm Test dose: negative  Assessment Events: blood not aspirated, injection not painful, no injection resistance, negative IV test and no paresthesia  Additional Notes Discussed risk of headache, infection, bleeding, nerve injury and failed or incomplete block.  Patient voices understanding and wishes to proceed.

## 2011-02-26 LAB — COMPREHENSIVE METABOLIC PANEL
ALT: 36 U/L — ABNORMAL HIGH (ref 0–35)
AST: 36 U/L (ref 0–37)
Albumin: 2 g/dL — ABNORMAL LOW (ref 3.5–5.2)
Alkaline Phosphatase: 156 U/L — ABNORMAL HIGH (ref 39–117)
BUN: 5 mg/dL — ABNORMAL LOW (ref 6–23)
BUN: 8 mg/dL (ref 6–23)
CO2: 26 mEq/L (ref 19–32)
Calcium: 7.4 mg/dL — ABNORMAL LOW (ref 8.4–10.5)
Chloride: 103 mEq/L (ref 96–112)
Chloride: 105 mEq/L (ref 96–112)
Creatinine, Ser: 0.74 mg/dL (ref 0.50–1.10)
GFR calc Af Amer: >90 mL/min (ref >90)
GFR calc non Af Amer: >90 mL/min (ref >90)
Glucose, Bld: 99 mg/dL (ref 70–99)
Potassium: 3.7 mEq/L (ref 3.5–5.1)
Sodium: 138 mEq/L (ref 135–145)
Total Bilirubin: 0.3 mg/dL (ref 0.3–1.2)
Total Bilirubin: 0.7 mg/dL (ref 0.3–1.2)

## 2011-02-26 LAB — CBC
HCT: 25.7 % — ABNORMAL LOW (ref 36.0–46.0)
HCT: 28 % — ABNORMAL LOW (ref 36.0–46.0)
Hemoglobin: 8.7 g/dL — ABNORMAL LOW (ref 12.0–15.0)
Hemoglobin: 9.5 g/dL — ABNORMAL LOW (ref 12.0–15.0)
MCH: 31.5 pg (ref 26.0–34.0)
MCV: 93.1 fL (ref 78.0–100.0)
RBC: 2.76 MIL/uL — ABNORMAL LOW (ref 3.87–5.11)
RDW: 13.7 % (ref 11.5–15.5)
WBC: 12.9 10*3/uL — ABNORMAL HIGH (ref 4.0–10.5)
WBC: 9.5 10*3/uL (ref 4.0–10.5)

## 2011-02-26 LAB — URIC ACID: Uric Acid, Serum: 3.9 mg/dL (ref 2.4–7.0)

## 2011-02-26 MED ORDER — MAGNESIUM SULFATE BOLUS VIA INFUSION
4.0000 g | Freq: Once | INTRAVENOUS | Status: AC
  Administered 2011-02-27: 4 g via INTRAVENOUS
  Filled 2011-02-26: qty 500

## 2011-02-26 MED ORDER — CALCIUM CARBONATE ANTACID 500 MG PO CHEW
1.0000 | CHEWABLE_TABLET | Freq: Three times a day (TID) | ORAL | Status: DC
  Administered 2011-02-26 – 2011-02-28 (×5): 200 mg via ORAL
  Filled 2011-02-26 (×4): qty 1
  Filled 2011-02-26 (×2): qty 2

## 2011-02-26 MED ORDER — LACTATED RINGERS IV SOLN
INTRAVENOUS | Status: DC
  Administered 2011-02-26: 01:00:00 via INTRAVENOUS

## 2011-02-26 MED ORDER — MAGNESIUM SULFATE 40 G IN LACTATED RINGERS - SIMPLE
2.0000 g/h | INTRAVENOUS | Status: DC
  Administered 2011-02-27: 2 g/h via INTRAVENOUS
  Filled 2011-02-26: qty 500

## 2011-02-26 NOTE — Anesthesia Postprocedure Evaluation (Signed)
  Anesthesia Post-op Note  Patient: Sonia Martinez  Procedure(s) Performed: * No procedures listed *  Patient Location: 109  Anesthesia Type: Epidural  Level of Consciousness: awake, alert  and oriented  Airway and Oxygen Therapy: Patient Spontanous Breathing  Post-op Pain: none  Post-op Assessment: Post-op Vital signs reviewed, Patient's Cardiovascular Status Stable, No headache, No backache, No residual numbness and No residual motor weakness  Post-op Vital Signs: Reviewed and stable  Complications: No apparent anesthesia complications

## 2011-02-26 NOTE — Progress Notes (Signed)
No C/O Tolerating regular diet, decreasing lochia  Blood pressure 121/72, pulse 74, temperature 98.2 F (36.8 C), temperature source Oral, resp. rate 20, height 5' 7.5" (1.715 m), weight 121.564 kg (268 lb), unknown if currently breastfeeding.  Lungs CTA Abd no epigastric tenderness, FFNT  Labs pending  Good UO  Magnesium Sulfate running  A: Preeclampsia     PPD # 1  P: check labs     Will stop magnesium today pending labs

## 2011-02-26 NOTE — Progress Notes (Signed)
Lactation in to talk with pt and pt's husband.

## 2011-02-27 ENCOUNTER — Inpatient Hospital Stay (HOSPITAL_COMMUNITY): Payer: BC Managed Care – PPO

## 2011-02-27 LAB — COMPREHENSIVE METABOLIC PANEL
ALT: 45 U/L — ABNORMAL HIGH (ref 0–35)
AST: 56 U/L — ABNORMAL HIGH (ref 0–37)
CO2: 26 mEq/L (ref 19–32)
Calcium: 7.3 mg/dL — ABNORMAL LOW (ref 8.4–10.5)
Creatinine, Ser: 0.68 mg/dL (ref 0.50–1.10)
GFR calc Af Amer: >90 mL/min (ref >90)
GFR calc non Af Amer: >90 mL/min (ref >90)
Sodium: 142 mEq/L (ref 135–145)
Total Protein: 4.9 g/dL — ABNORMAL LOW (ref 6.0–8.3)

## 2011-02-27 LAB — MRSA PCR SCREENING: MRSA by PCR: POSITIVE — AB

## 2011-02-27 MED ORDER — DIPHENHYDRAMINE-ZINC ACETATE 2-0.1 % EX CREA
TOPICAL_CREAM | Freq: Two times a day (BID) | CUTANEOUS | Status: DC | PRN
  Administered 2011-02-27: 17:00:00 via TOPICAL
  Filled 2011-02-27: qty 28.4

## 2011-02-27 MED ORDER — CHLORHEXIDINE GLUCONATE CLOTH 2 % EX PADS
6.0000 | MEDICATED_PAD | Freq: Every day | CUTANEOUS | Status: DC
  Administered 2011-02-27 – 2011-02-28 (×2): 6 via TOPICAL

## 2011-02-27 MED ORDER — MAGNESIUM SULFATE 40 G IN LACTATED RINGERS - SIMPLE
2.0000 g/h | INTRAVENOUS | Status: AC
  Filled 2011-02-27: qty 500

## 2011-02-27 MED ORDER — MUPIROCIN 2 % EX OINT
1.0000 "application " | TOPICAL_OINTMENT | Freq: Two times a day (BID) | CUTANEOUS | Status: DC
  Administered 2011-02-27 – 2011-02-28 (×3): 1 via NASAL
  Filled 2011-02-27: qty 22

## 2011-02-27 MED ORDER — ACETAMINOPHEN 325 MG PO TABS
650.0000 mg | ORAL_TABLET | Freq: Four times a day (QID) | ORAL | Status: DC | PRN
  Filled 2011-02-27: qty 2

## 2011-02-27 MED ORDER — LACTATED RINGERS IV SOLN
INTRAVENOUS | Status: DC
  Administered 2011-02-27: 12:00:00 via INTRAVENOUS

## 2011-02-27 MED ORDER — GADOBENATE DIMEGLUMINE 529 MG/ML IV SOLN
20.0000 mL | Freq: Once | INTRAVENOUS | Status: AC | PRN
  Administered 2011-02-27: 20 mL via INTRAVENOUS

## 2011-02-27 NOTE — Progress Notes (Signed)
Feeling much better although still a "little off".  Neuro consult done this am-note pending.  Blood pressure 118/73, pulse 64, temperature 97.6 F (36.4 C), temperature source Oral, resp. rate 16, height 5' 7.5" (1.715 m), weight 120.43 kg (265 lb 8 oz), SpO2 86.00%, unknown if currently breastfeeding.  Lungs CTA Cor RRR Abd soft, no epigastric tenderness  MRI normal  A:PRES appears less likely given normal MRI    Poss PP preeclampsia    Will await neuro note as well  P: will continue magnesium for 12 hours total-if patient feeling well will discontinue at that time unless neuro recommends differently     Observe tonight in AICU

## 2011-02-27 NOTE — Progress Notes (Signed)
[id number] pt down for CT scan; tolerated without any difficulty.

## 2011-02-27 NOTE — Consult Note (Signed)
Reason for Consult: "generalized tingling and brief episode of tunnel vision"  HPI: Sonia Martinez is an 31 y.o. Female who delivered on Friday and had generalized tingling yesterday evening with an episode of visual change where it seemed to her that objects near by where actually further away. The tingling has mostly resolved today, but she still complains of sensation of heaviness in her head and neck. Of note, she had pre-eclampsia complicating this recent pregnancy. She also complained of having head pressure.   Past Medical History  Diagnosis Date  . Asthma   . Pregnancy induced hypertension   . Anxiety    Medications: I have reviewed the patient's current medications.  Past Surgical History  Procedure Date  . Wisdom tooth extraction    Family History  Problem Relation Age of Onset  . Anesthesia problems Neg Hx   . Malignant hyperthermia Neg Hx   . Pseudochol deficiency Neg Hx   . Hypotension Neg Hx    Social History:  reports that she has never smoked. She has never used smokeless tobacco. She reports that she does not drink alcohol or use illicit drugs.  Allergies:  Allergies  Allergen Reactions  . Ciprofloxacin Hives  . Coconut Oil Hives  . Hydrocortisone Hives   ROS: as above  Blood pressure 118/73, pulse 64, temperature 97.6 F (36.4 C), temperature source Oral, resp. rate 16, height 5' 7.5" (1.715 m), weight 120.43 kg (265 lb 8 oz), SpO2 86.00%, unknown if currently breastfeeding.  Neurological exam: AAO*3. No aphasia.  Followed complex commands. Cranial nerves: EOMI, PERRL. Visual fields were full. Sensation to V1 through V3 areas of the face was intact and symmetric throughout. There was no facial asymmetry. Hearing to finger rub was equal and symmetrical bilaterally. Shoulder shrug was 5/5 and symmetric bilaterally. Head rotation was 5/5 bilaterally. There was no dysarthria or palatal deviation. Motor: strength was 5/5 and symmetric throughout. Sensory: was  intact throughout to light touch, pinprick, vibration and proprioception. Coordination: finger-to-nose were intact and symmetric bilaterally. Reflexes: were 2+ in upper extremities and 3+ at the knees and 2+ at the ankles. Plantar response was downgoing bilaterally. Gait: Romberg test was negative. There was no ataxia noted.  Results for orders placed during the hospital encounter of 02/23/11 (from the past 48 hour(s))  CBC     Status: Abnormal   Collection Time   02/26/11  9:56 AM      Component Value Range Comment   WBC 12.9 (*) 4.0 - 10.5 (K/uL)    RBC 3.02 (*) 3.87 - 5.11 (MIL/uL)    Hemoglobin 9.5 (*) 12.0 - 15.0 (g/dL)    HCT 16.1 (*) 09.6 - 46.0 (%)    MCV 92.7  78.0 - 100.0 (fL)    MCH 31.5  26.0 - 34.0 (pg)    MCHC 33.9  30.0 - 36.0 (g/dL)    RDW 04.5  40.9 - 81.1 (%)    Platelets 178  150 - 400 (K/uL)   COMPREHENSIVE METABOLIC PANEL     Status: Abnormal   Collection Time   02/26/11  9:56 AM      Component Value Range Comment   Sodium 138  135 - 145 (mEq/L)    Potassium 3.7  3.5 - 5.1 (mEq/L)    Chloride 103  96 - 112 (mEq/L)    CO2 27  19 - 32 (mEq/L)    Glucose, Bld 110 (*) 70 - 99 (mg/dL)    BUN 5 (*) 6 - 23 (mg/dL)  Creatinine, Ser 0.74  0.50 - 1.10 (mg/dL)    Calcium 6.7 (*) 8.4 - 10.5 (mg/dL)    Total Protein 4.6 (*) 6.0 - 8.3 (g/dL)    Albumin 2.0 (*) 3.5 - 5.2 (g/dL)    AST 36  0 - 37 (U/L)    ALT 36 (*) 0 - 35 (U/L)    Alkaline Phosphatase 156 (*) 39 - 117 (U/L)    Total Bilirubin 0.7  0.3 - 1.2 (mg/dL)    GFR calc non Af Amer >90  >90 (mL/min)    GFR calc Af Amer >90  >90 (mL/min)   URIC ACID     Status: Normal   Collection Time   02/26/11  9:56 AM      Component Value Range Comment   Uric Acid, Serum 3.9  2.4 - 7.0 (mg/dL)   GLUCOSE, CAPILLARY     Status: Normal   Collection Time   02/26/11 10:59 PM      Component Value Range Comment   Glucose-Capillary 99  70 - 99 (mg/dL)    Comment 1 Notify RN     CBC     Status: Abnormal   Collection Time   02/26/11  11:30 PM      Component Value Range Comment   WBC 9.5  4.0 - 10.5 (K/uL)    RBC 2.76 (*) 3.87 - 5.11 (MIL/uL)    Hemoglobin 8.7 (*) 12.0 - 15.0 (g/dL)    HCT 16.1 (*) 09.6 - 46.0 (%)    MCV 93.1  78.0 - 100.0 (fL)    MCH 31.5  26.0 - 34.0 (pg)    MCHC 33.9  30.0 - 36.0 (g/dL)    RDW 04.5  40.9 - 81.1 (%)    Platelets 165  150 - 400 (K/uL)   COMPREHENSIVE METABOLIC PANEL     Status: Abnormal   Collection Time   02/26/11 11:30 PM      Component Value Range Comment   Sodium 137  135 - 145 (mEq/L)    Potassium 3.6  3.5 - 5.1 (mEq/L)    Chloride 105  96 - 112 (mEq/L)    CO2 26  19 - 32 (mEq/L)    Glucose, Bld 99  70 - 99 (mg/dL)    BUN 8  6 - 23 (mg/dL)    Creatinine, Ser 9.14  0.50 - 1.10 (mg/dL)    Calcium 7.4 (*) 8.4 - 10.5 (mg/dL)    Total Protein 4.9 (*) 6.0 - 8.3 (g/dL)    Albumin 2.1 (*) 3.5 - 5.2 (g/dL)    AST 35  0 - 37 (U/L)    ALT 32  0 - 35 (U/L)    Alkaline Phosphatase 138 (*) 39 - 117 (U/L)    Total Bilirubin 0.3  0.3 - 1.2 (mg/dL)    GFR calc non Af Amer >90  >90 (mL/min)    GFR calc Af Amer >90  >90 (mL/min)   MAGNESIUM     Status: Abnormal   Collection Time   02/26/11 11:30 PM      Component Value Range Comment   Magnesium 2.6 (*) 1.5 - 2.5 (mg/dL)   MRSA PCR SCREENING     Status: Abnormal   Collection Time   02/27/11  1:20 AM      Component Value Range Comment   MRSA by PCR POSITIVE (*) NEGATIVE     Ct Head Wo Contrast  02/27/2011  *RADIOLOGY REPORT*  Clinical Data: Postpartum preeclampsia, tingling/numbness, right greater than left.  CT HEAD WITHOUT CONTRAST  Technique:  Contiguous axial images were obtained from the base of the skull through the vertex without contrast.  Comparison: None.  Findings: Mild posterior/occipital lobe hypodensities bilaterally. There is no evidence for acute hemorrhage, hydrocephalus, mass lesion, or abnormal extra-axial fluid collection.  The visualized paranasal sinuses and mastoid air cells are predominately clear.  IMPRESSION:  Bilateral mildly hypoattenuating areas within the occipital lobes may reflect posterior reversible encephalopathy syndrome. Can be better characterized with MRI.  Original Report Authenticated By: Waneta Martins, M.D.   Mr Laqueta Jean ZO Contrast  02/27/2011  *RADIOLOGY REPORT*  Clinical Data: Postpartum preeclampsia.  Abnormal CT of the head. Question posterior reversible encephalopathy syndrome.  MRI HEAD WITHOUT AND WITH CONTRAST  Technique:  Multiplanar, multiecho pulse sequences of the brain and surrounding structures were obtained according to standard protocol without and with intravenous contrast  Contrast: 20mL MULTIHANCE GADOBENATE DIMEGLUMINE 529 MG/ML IV SOLN  Comparison: CT head without contrast 02/27/2011.  Findings: The MRI does not confirm the posterior white matter changes.  No acute infarct, hemorrhage, or mass lesion is present  The postcontrast images demonstrate no pathologic enhancement.  The pituitary gland is somewhat prominent, measuring 9 mm in the midline.  This is within normal limits following recent delivery.  The cerebellar tonsils extend slightly below the foramen magnum in the midline without evidence for a Chiari malformation.  IMPRESSION:  1.  No significant white matter disease to suggest a posterior reversible encephalopathy syndrome. 2.  Prominence of the pituitary is within normal limits following recent delivery. 3.  Mild cerebellar tonsillar ectopia does not meet criteria for a Chiari malformation. 4.  No acute or focal intracranial abnormality to explain the patient's symptoms.  Original Report Authenticated By: Jamesetta Orleans. MATTERN, M.D.   Assessment/Plan: 31 years old woman with generalized body tingling related in all likelihood to hypocalcemia. The visual changes may be related to migraine aura as typically tunnel vision and changes in size of objects may be seen in these situations. 1) Correct hypocalcemia 2) Pain management prn headache 3) Call us back if  patient develops any new focal neurological deficits 4) Call with questions  Delois Silvester 02/27/2011, 11:01 AM

## 2011-02-27 NOTE — Progress Notes (Signed)
Pt to AICU via wheelchair; oriented to AICU, Elink and visitation. Husband at bedside. No acute distress noted at present

## 2011-02-27 NOTE — Progress Notes (Signed)
PE Neck supple Grip strength 3+ equal

## 2011-02-27 NOTE — Progress Notes (Signed)
CTSP States she was up to BR , then in bed and started tingling/numbness in arms/legs and felt like she was in a tunnel or "out of my body". Now tingling in arms persists, rt slightly more than left. Less tingling in feet now.  Saw some spots, no blurry vision, no headache, no epigastric pain.  When blood was drawn, states she could not feel it. No chest pain, no SOB, no leg pain/cramps.  Blood pressure 120/81, pulse 81, temperature 97.4 F (36.3 C), temperature source Oral, resp. rate 24, height 5' 7.5" (1.715 m), weight 121.564 kg (268 lb), SpO2 97.00%, unknown if currently breastfeeding. Pt in NAD Skin W&D Neuro-face/smile symmetric, DTR 3+ and equal in upper ext., DTR 4+ with 1 beat of clonus in bilat Lower ext, feels pinch in both upper ext. Lungs CTA Cor RRR Lower Ext 3 + edema, NT without cords  Results for orders placed during the hospital encounter of 02/23/11 (from the past 24 hour(s))  CBC     Status: Abnormal   Collection Time   02/26/11  9:56 AM      Component Value Range   WBC 12.9 (*) 4.0 - 10.5 (K/uL)   RBC 3.02 (*) 3.87 - 5.11 (MIL/uL)   Hemoglobin 9.5 (*) 12.0 - 15.0 (g/dL)   HCT 16.1 (*) 09.6 - 46.0 (%)   MCV 92.7  78.0 - 100.0 (fL)   MCH 31.5  26.0 - 34.0 (pg)   MCHC 33.9  30.0 - 36.0 (g/dL)   RDW 04.5  40.9 - 81.1 (%)   Platelets 178  150 - 400 (K/uL)  COMPREHENSIVE METABOLIC PANEL     Status: Abnormal   Collection Time   02/26/11  9:56 AM      Component Value Range   Sodium 138  135 - 145 (mEq/L)   Potassium 3.7  3.5 - 5.1 (mEq/L)   Chloride 103  96 - 112 (mEq/L)   CO2 27  19 - 32 (mEq/L)   Glucose, Bld 110 (*) 70 - 99 (mg/dL)   BUN 5 (*) 6 - 23 (mg/dL)   Creatinine, Ser 9.14  0.50 - 1.10 (mg/dL)   Calcium 6.7 (*) 8.4 - 10.5 (mg/dL)   Total Protein 4.6 (*) 6.0 - 8.3 (g/dL)   Albumin 2.0 (*) 3.5 - 5.2 (g/dL)   AST 36  0 - 37 (U/L)   ALT 36 (*) 0 - 35 (U/L)   Alkaline Phosphatase 156 (*) 39 - 117 (U/L)   Total Bilirubin 0.7  0.3 - 1.2 (mg/dL)   GFR calc non  Af Amer >90  >90 (mL/min)   GFR calc Af Amer >90  >90 (mL/min)  URIC ACID     Status: Normal   Collection Time   02/26/11  9:56 AM      Component Value Range   Uric Acid, Serum 3.9  2.4 - 7.0 (mg/dL)  GLUCOSE, CAPILLARY     Status: Normal   Collection Time   02/26/11 10:59 PM      Component Value Range   Glucose-Capillary 99  70 - 99 (mg/dL)   Comment 1 Notify RN    CBC     Status: Abnormal   Collection Time   02/26/11 11:30 PM      Component Value Range   WBC 9.5  4.0 - 10.5 (K/uL)   RBC 2.76 (*) 3.87 - 5.11 (MIL/uL)   Hemoglobin 8.7 (*) 12.0 - 15.0 (g/dL)   HCT 78.2 (*) 95.6 - 46.0 (%)   MCV  93.1  78.0 - 100.0 (fL)   MCH 31.5  26.0 - 34.0 (pg)   MCHC 33.9  30.0 - 36.0 (g/dL)   RDW 16.1  09.6 - 04.5 (%)   Platelets 165  150 - 400 (K/uL)  COMPREHENSIVE METABOLIC PANEL     Status: Abnormal   Collection Time   02/26/11 11:30 PM      Component Value Range   Sodium 137  135 - 145 (mEq/L)   Potassium 3.6  3.5 - 5.1 (mEq/L)   Chloride 105  96 - 112 (mEq/L)   CO2 26  19 - 32 (mEq/L)   Glucose, Bld 99  70 - 99 (mg/dL)   BUN 8  6 - 23 (mg/dL)   Creatinine, Ser 4.09  0.50 - 1.10 (mg/dL)   Calcium 7.4 (*) 8.4 - 10.5 (mg/dL)   Total Protein 4.9 (*) 6.0 - 8.3 (g/dL)   Albumin 2.1 (*) 3.5 - 5.2 (g/dL)   AST 35  0 - 37 (U/L)   ALT 32  0 - 35 (U/L)   Alkaline Phosphatase 138 (*) 39 - 117 (U/L)   Total Bilirubin 0.3  0.3 - 1.2 (mg/dL)   GFR calc non Af Amer >90  >90 (mL/min)   GFR calc Af Amer >90  >90 (mL/min)  MAGNESIUM     Status: Abnormal   Collection Time   02/26/11 11:30 PM      Component Value Range   Magnesium 2.6 (*) 1.5 - 2.5 (mg/dL)  A: Prob PP preeclampsia     R/O intracranial bleeding-no lateralizing signs except arms tingle slightly more in right  P: D/W pt and husband     Will begin magnesium sulfate     D/W Dr Jena Gauss radiology-will get head CT

## 2011-02-27 NOTE — Progress Notes (Signed)
Patient in AICU Continues to feel same. Is not comfortable holding baby for fear she will drop it.  However, is able to drink, etc.. Without trouble.  Blood pressure 122/63, pulse 73, temperature 97.6 F (36.4 C), temperature source Oral, resp. rate 20, height 5' 7.5" (1.715 m), weight 120.43 kg (265 lb 8 oz), SpO2 97.00%, unknown if currently breastfeeding.  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  D/W Dr. Caryl Pina, radiology.  He recommends MRI of brain without contrast.  Ordered.  Have paged neuro on call. Awaiting response.  Will continue magnesium sulfate for seizure prophylaxis. BPs normal. Labs OK.  D/W pt and husband.

## 2011-02-27 NOTE — Progress Notes (Signed)
D/W Dr Sigmund Hazel, neuro Will see pt this am States MRI later this morning is appropriate.

## 2011-02-27 NOTE — Progress Notes (Signed)
Received referral for LCSW consult from newborn nursery sheet due to report of maternal anxiousness.  FOB was at the nurses station in AICU requesting no visitors due to mom not having had a chance to get quality rest.  Bedside nurse reports that she has spoken with patient on multiple occasions and has denied feelings of anxiety or depression.  Bedside nurse reports patient has had normal reactions to her medical issues which have required careful management.  FOB very supportive and helpful.  MOB appropriate by bedside nurse report.  Care providers are encouraged to follow up with LCSW should support be needed.  Staci Acosta, LCSW, 02/27/2011, 3:47 pm

## 2011-02-28 NOTE — Progress Notes (Signed)
Pt denies c/o pain, heavy vb.  Neuro sx have resolved.  Denies HA, visual changes and numbness or tingling extremities.  Breastfeeding.  AF, VSS Gen - NAD Abd - fundus firm, NT Ext - no edema  A/P:  Discharge home Fu office 2-3days for BP ck Pt and husband know to call office or come to [facility] with return of neuro sx

## 2011-02-28 NOTE — Progress Notes (Signed)
UR chart review completed.  

## 2011-02-28 NOTE — Discharge Summary (Signed)
Obstetric Discharge Summary Reason for Admission: induction of labor Prenatal Procedures: ultrasound Intrapartum Procedures: spontaneous vaginal delivery Postpartum Procedures: MRI Complications-Operative and Postpartum: postpartum neurological sx of unknown cause Hemoglobin  Date Value Range Status  02/26/2011 8.7* 12.0-15.0 (g/dL) Final     HCT  Date Value Range Status  02/26/2011 25.7* 36.0-46.0 (%) Final    Discharge Diagnoses: Term Pregnancy-delivered and Preelampsia  Discharge Information: Date: 02/28/2011 Activity: pelvic rest Diet: routine Medications: PNV and Ibuprofen Condition: stable and improved Instructions: refer to practice specific booklet Discharge to: home Follow-up Information    Schedule an appointment as soon as possible for a visit in 3 days to follow up. (this wk)          Newborn Data: Live born female  Birth Weight: 6 lb 11.8 oz (3056 g) APGAR: 8, 8  Home with mother.  Sonia Martinez 02/28/2011, 9:02 AM

## 2013-02-18 DIAGNOSIS — J342 Deviated nasal septum: Secondary | ICD-10-CM | POA: Insufficient documentation

## 2013-04-30 ENCOUNTER — Ambulatory Visit: Payer: Self-pay | Admitting: Otolaryngology

## 2013-05-29 ENCOUNTER — Ambulatory Visit: Payer: Self-pay | Admitting: Family Medicine

## 2013-05-29 LAB — URINALYSIS, COMPLETE
Bilirubin,UR: NEGATIVE
Blood: NEGATIVE
GLUCOSE, UR: NEGATIVE mg/dL (ref 0–75)
Ketone: NEGATIVE
Leukocyte Esterase: NEGATIVE
Nitrite: NEGATIVE
Ph: 6 (ref 4.5–8.0)
SPECIFIC GRAVITY: 1.025 (ref 1.003–1.030)

## 2013-05-29 LAB — PREGNANCY, URINE: PREGNANCY TEST, URINE: NEGATIVE m[IU]/mL

## 2013-06-05 ENCOUNTER — Encounter (INDEPENDENT_AMBULATORY_CARE_PROVIDER_SITE_OTHER): Payer: Self-pay

## 2013-06-05 ENCOUNTER — Ambulatory Visit (INDEPENDENT_AMBULATORY_CARE_PROVIDER_SITE_OTHER): Payer: BC Managed Care – PPO | Admitting: Neurology

## 2013-06-05 ENCOUNTER — Encounter: Payer: Self-pay | Admitting: Neurology

## 2013-06-05 VITALS — BP 122/83 | HR 69 | Ht 67.5 in | Wt 252.5 lb

## 2013-06-05 DIAGNOSIS — G43009 Migraine without aura, not intractable, without status migrainosus: Secondary | ICD-10-CM | POA: Insufficient documentation

## 2013-06-05 DIAGNOSIS — R209 Unspecified disturbances of skin sensation: Secondary | ICD-10-CM

## 2013-06-05 HISTORY — DX: Migraine without aura, not intractable, without status migrainosus: G43.009

## 2013-06-05 MED ORDER — TOPIRAMATE 25 MG PO TABS: ORAL_TABLET | ORAL | Status: DC

## 2013-06-05 MED ORDER — RIZATRIPTAN BENZOATE 10 MG PO TABS: 10.0000 mg | ORAL_TABLET | Freq: Three times a day (TID) | ORAL | Status: DC | PRN

## 2013-06-05 NOTE — Progress Notes (Signed)
Reason for visit: Headache  Esperanza HeirKimberly Gotham is a 33 y.o. female  History of present illness:  Ms. Susann GivensFranklin is a 33 year old right-handed white female with a history of obesity and a history of headache. She indicates that she began having headaches in 2010 while working out. She indicates that she was lifting weights, and suddenly heard a pop, and then began having headaches coming up from the back of the head and around to the front of the head. The patient has had headaches since that time occurring 2 or 3 times a week, lasting anywhere from 2-3 hours to overnight. She indicates that sleep will sometimes help the headache. She denies any significant problems with nausea or vomiting, but on occasion she may feel presyncopal with her headaches with tunnel vision, but she never does she have loss of consciousness. She does report some photophobia and phonophobia with the headache. She does have allergies, but she does not clearly relate this to her headaches. She also describes some sinus headaches in the front of the head, different from her occipital headaches. She reports some intermittent numbness and tingling sensations in the hands and feet since the onset of her headaches, but she does not relate this with her headaches at all times. She has been evaluated for carpal tunnel syndrome, and was never found to have this entity. She uses Tylenol or Excedrin Migraine for her headaches. She denies any family history of headache. She is sent to this office for an evaluation. Previously, she had MRI evaluation of the brain in February 2013 that was unremarkable. The patient does have some dizziness on occasion with the headache.   Past Medical History  Diagnosis Date  . Asthma   . Pregnancy induced hypertension   . Anxiety   . Migraine without aura, without mention of intractable migraine without mention of status migrainosus 06/05/2013  . IBS (irritable bowel syndrome)   . Obesity     Past  Surgical History  Procedure Laterality Date  . Wisdom tooth extraction    . Septoplasty      Family History  Problem Relation Age of Onset  . Anesthesia problems Neg Hx   . Malignant hyperthermia Neg Hx   . Pseudochol deficiency Neg Hx   . Hypotension Neg Hx   . Migraines Neg Hx   . Hypertension Mother   . Cancer - Other Mother     uterine  . Heart disease Maternal Grandfather   . Diabetes Paternal Grandmother   . Lung cancer Paternal Grandfather   . Diabetes Paternal Grandfather     Social history:  reports that she has never smoked. She has never used smokeless tobacco. She reports that she does not drink alcohol or use illicit drugs.  Medications:  Current Outpatient Prescriptions on File Prior to Visit  Medication Sig Dispense Refill  . calcium carbonate (TUMS EX) 750 MG chewable tablet Chew 2 tablets by mouth daily as needed. Stomach acid      . fluticasone (FLONASE) 50 MCG/ACT nasal spray Place 2 sprays into the nose daily. 2 sprays in each nostril      . L-Lysine 1000 MG TABS Take 1 tablet by mouth at bedtime.      . montelukast (SINGULAIR) 10 MG tablet Take 10 mg by mouth at bedtime.      . ranitidine (ZANTAC) 150 MG tablet Take 150 mg by mouth daily.       No current facility-administered medications on file prior to visit.  Allergies  Allergen Reactions  . Ciprofloxacin Hives  . Coconut Oil Hives  . Hydrocortisone Hives    ROS:  Out of a complete 14 system review of symptoms, the patient complains only of the following symptoms, and all other reviewed systems are negative.  Constipation  Joint pain Allergies Headache, numbness, dizziness  Blood pressure 122/83, pulse 69, height 5' 7.5" (1.715 m), weight 252 lb 8 oz (114.533 kg), unknown if currently breastfeeding.  Physical Exam  General: The patient is alert and cooperative at the time of the examination. The patient is markedly obese.   Eyes: Pupils are equal, round, and reactive to light.  Discs are flat bilaterally.  Neck: The neck is supple, no carotid bruits are noted.  Respiratory: The respiratory examination is clear.  Cardiovascular: The cardiovascular examination reveals a regular rate and rhythm, no obvious murmurs or rubs are noted.  Neuromuscular: Range of movement of the cervical spine is full. No crepitus is noted in the temporomandibular joints.   Skin: Extremities are without significant edema.  Neurologic Exam  Mental status: The patient is alert and oriented x 3 at the time of the examination. The patient has apparent normal recent and remote memory, with an apparently normal attention span and concentration ability.  Cranial nerves: Facial symmetry is present. There is good sensation of the face to pinprick and soft touch bilaterally. The strength of the facial muscles and the muscles to head turning and shoulder shrug are normal bilaterally. Speech is well enunciated, no aphasia or dysarthria is noted. Extraocular movements are full. Visual fields are full. The tongue is midline, and the patient has symmetric elevation of the soft palate. No obvious hearing deficits are noted.  Motor: The motor testing reveals 5 over 5 strength of all 4 extremities. Good symmetric motor tone is noted throughout.  Sensory: Sensory testing is intact to pinprick, soft touch, vibration sensation, and position sense on all 4 extremities. No evidence of extinction is noted.  Coordination: Cerebellar testing reveals good finger-nose-finger and heel-to-shin bilaterally.  Gait and station: Gait is normal. Tandem gait is normal. Romberg is negative. No drift is seen.  Reflexes: Deep tendon reflexes are symmetric and normal bilaterally. Toes are downgoing bilaterally.   MRI brain 02/27/2011:  IMPRESSION:  1. No significant white matter disease to suggest a posterior  reversible encephalopathy syndrome.  2. Prominence of the pituitary is within normal limits following  recent  delivery.  3. Mild cerebellar tonsillar ectopia does not meet criteria for a  Chiari malformation.  4. No acute or focal intracranial abnormality to explain the  patient's symptoms.    Assessment/Plan:  One. Headache, probable migraine  2. Numbness, dysesthesias all 4 extremities  The patient reports sudden onset of headaches in 2010 associated with exercise, weight lifting. The patient will be sent for MRI evaluation of the cervical spine given this history and with a history of intermittent numbness of all 4 extremities. She will be placed on Topamax, and she will be given Maxalt to take if needed for the headache. The patient will followup in 4 months. She will contact our office if she does not tolerate the medications or the medications are ineffective.  Marlan Palau. Keith Cleburne Savini MD 06/05/2013 8:31 PM  [facility] [address] [place], [place] [id number]  Phone [phone] Fax [phone]

## 2013-06-05 NOTE — Patient Instructions (Signed)
Migraine Headache A migraine headache is an intense, throbbing pain on one or both sides of your head. A migraine can last for 30 minutes to several hours. CAUSES  The exact cause of a migraine headache is not always known. However, a migraine may be caused when nerves in the brain become irritated and release chemicals that cause inflammation. This causes pain. Certain things may also trigger migraines, such as:  Alcohol.  Smoking.  Stress.  Menstruation.  Aged cheeses.  Foods or drinks that contain nitrates, glutamate, aspartame, or tyramine.  Lack of sleep.  Chocolate.  Caffeine.  Hunger.  Physical exertion.  Fatigue.  Medicines used to treat chest pain (nitroglycerine), birth control pills, estrogen, and some blood pressure medicines. SIGNS AND SYMPTOMS  Pain on one or both sides of your head.  Pulsating or throbbing pain.  Severe pain that prevents daily activities.  Pain that is aggravated by any physical activity.  Nausea, vomiting, or both.  Dizziness.  Pain with exposure to bright lights, loud noises, or activity.  General sensitivity to bright lights, loud noises, or smells. Before you get a migraine, you may get warning signs that a migraine is coming (aura). An aura may include:  Seeing flashing lights.  Seeing bright spots, halos, or zig-zag lines.  Having tunnel vision or blurred vision.  Having feelings of numbness or tingling.  Having trouble talking.  Having muscle weakness. DIAGNOSIS  A migraine headache is often diagnosed based on:  Symptoms.  Physical exam.  A CT scan or MRI of your head. These imaging tests cannot diagnose migraines, but they can help rule out other causes of headaches. TREATMENT Medicines may be given for pain and nausea. Medicines can also be given to help prevent recurrent migraines.  HOME CARE INSTRUCTIONS  Only take over-the-counter or prescription medicines for pain or discomfort as directed by your  health care provider. The use of long-term narcotics is not recommended.  Lie down in a dark, quiet room when you have a migraine.  Keep a journal to find out what may trigger your migraine headaches. For example, write down:  What you eat and drink.  How much sleep you get.  Any change to your diet or medicines.  Limit alcohol consumption.  Quit smoking if you smoke.  Get 7 9 hours of sleep, or as recommended by your health care provider.  Limit stress.  Keep lights dim if bright lights bother you and make your migraines worse. SEEK IMMEDIATE MEDICAL CARE IF:   Your migraine becomes severe.  You have a fever.  You have a stiff neck.  You have vision loss.  You have muscular weakness or loss of muscle control.  You start losing your balance or have trouble walking.  You feel faint or pass out.  You have severe symptoms that are different from your first symptoms. MAKE SURE YOU:   Understand these instructions.  Will watch your condition.  Will get help right away if you are not doing well or get worse. Document Released: 01/10/2005 Document Revised: 10/31/2012 Document Reviewed: 09/17/2012 Exit[employer] Patient Information 2014 [employer].  

## 2013-06-13 ENCOUNTER — Other Ambulatory Visit: Payer: BC Managed Care – PPO

## 2013-06-13 ENCOUNTER — Ambulatory Visit (INDEPENDENT_AMBULATORY_CARE_PROVIDER_SITE_OTHER): Payer: BC Managed Care – PPO

## 2013-06-13 DIAGNOSIS — G43009 Migraine without aura, not intractable, without status migrainosus: Secondary | ICD-10-CM

## 2013-06-13 DIAGNOSIS — R209 Unspecified disturbances of skin sensation: Secondary | ICD-10-CM

## 2013-06-14 ENCOUNTER — Telehealth: Payer: Self-pay | Admitting: Neurology

## 2013-06-14 DIAGNOSIS — S139XXA Sprain of joints and ligaments of unspecified parts of neck, initial encounter: Secondary | ICD-10-CM

## 2013-06-14 NOTE — Telephone Encounter (Signed)
I called patient. MRI the brain structurally looks good, the neck is straight, suggesting muscle spasm. The patient does have neck and shoulder discomfort, and the numbness and tingling in hands may be related to this. The patient may be having some cervicogenic headache. I'll get the patient set up for some neuromuscular therapy on the neck.    MRI cervical spine May 21st 2015:  FINDINGS:  The cervical vertebra demonstrative loss of forward lordotic curvature but  normal body height and bone marrow signal characteristics. The  intervertebral discs are normal signal characteristics without disc  herniation, cord compression, significant root or foraminal encroachment.  The spinal cord parenchyma shows normal signal characteristics. The  visualized portion of the lower brain stem, cerebellum, cranial vertebral  junction and upper thoracic spine appear normal.

## 2013-06-24 ENCOUNTER — Encounter: Payer: Self-pay | Admitting: Adult Health

## 2013-06-28 ENCOUNTER — Ambulatory Visit: Payer: BC Managed Care – PPO | Attending: Family Medicine | Admitting: Rehabilitative and Restorative Service Providers"

## 2013-06-28 DIAGNOSIS — M542 Cervicalgia: Secondary | ICD-10-CM | POA: Insufficient documentation

## 2013-06-28 DIAGNOSIS — IMO0001 Reserved for inherently not codable concepts without codable children: Secondary | ICD-10-CM | POA: Diagnosis present

## 2013-07-03 ENCOUNTER — Ambulatory Visit: Payer: BC Managed Care – PPO | Admitting: Physical Therapy

## 2013-07-03 DIAGNOSIS — IMO0001 Reserved for inherently not codable concepts without codable children: Secondary | ICD-10-CM | POA: Diagnosis not present

## 2013-07-05 ENCOUNTER — Ambulatory Visit: Payer: BC Managed Care – PPO | Admitting: Physical Therapy

## 2013-07-05 DIAGNOSIS — IMO0001 Reserved for inherently not codable concepts without codable children: Secondary | ICD-10-CM | POA: Diagnosis not present

## 2013-07-09 ENCOUNTER — Ambulatory Visit: Payer: BC Managed Care – PPO | Admitting: Physical Therapy

## 2013-07-09 DIAGNOSIS — IMO0001 Reserved for inherently not codable concepts without codable children: Secondary | ICD-10-CM | POA: Diagnosis not present

## 2013-07-11 ENCOUNTER — Ambulatory Visit: Payer: BC Managed Care – PPO | Admitting: Physical Therapy

## 2013-07-11 DIAGNOSIS — IMO0001 Reserved for inherently not codable concepts without codable children: Secondary | ICD-10-CM | POA: Diagnosis not present

## 2013-07-17 ENCOUNTER — Ambulatory Visit: Payer: BC Managed Care – PPO | Admitting: Physical Therapy

## 2013-07-17 DIAGNOSIS — IMO0001 Reserved for inherently not codable concepts without codable children: Secondary | ICD-10-CM | POA: Diagnosis not present

## 2013-07-19 ENCOUNTER — Ambulatory Visit: Payer: BC Managed Care – PPO | Admitting: Rehabilitative and Restorative Service Providers"

## 2013-07-19 DIAGNOSIS — IMO0001 Reserved for inherently not codable concepts without codable children: Secondary | ICD-10-CM | POA: Diagnosis not present

## 2013-10-07 ENCOUNTER — Encounter: Payer: Self-pay | Admitting: Adult Health

## 2013-10-07 ENCOUNTER — Ambulatory Visit (INDEPENDENT_AMBULATORY_CARE_PROVIDER_SITE_OTHER): Payer: BC Managed Care – PPO | Admitting: Adult Health

## 2013-10-07 ENCOUNTER — Encounter (INDEPENDENT_AMBULATORY_CARE_PROVIDER_SITE_OTHER): Payer: Self-pay

## 2013-10-07 VITALS — BP 124/83 | HR 90 | Ht 67.5 in | Wt 252.0 lb

## 2013-10-07 DIAGNOSIS — G43009 Migraine without aura, not intractable, without status migrainosus: Secondary | ICD-10-CM

## 2013-10-07 MED ORDER — TIZANIDINE HCL 2 MG PO CAPS: 2.0000 mg | ORAL_CAPSULE | Freq: Every day | ORAL | Status: DC

## 2013-10-07 NOTE — Progress Notes (Signed)
I have read the note, and I agree with the clinical assessment and plan.  Sonia Martinez,Sonia Martinez   

## 2013-10-07 NOTE — Patient Instructions (Signed)
Tizanidine tablets or capsules What is this medicine? TIZANIDINE (tye ZAN i deen) helps to relieve muscle spasms. It may be used to help in the treatment of multiple sclerosis and spinal cord injury. This medicine may be used for other purposes; ask your health care provider or pharmacist if you have questions. COMMON BRAND NAME(S): Zanaflex What should I tell my health care provider before I take this medicine? They need to know if you have any of these conditions: -kidney disease -liver disease -low blood pressure -mental disorder -an unusual or allergic reaction to tizanidine, other medicines, lactose (tablets only), foods, dyes, or preservatives -pregnant or trying to get pregnant -breast-feeding How should I use this medicine? Take this medicine by mouth with a full glass of water. Take this medicine on an empty stomach, at least 30 minutes before or 2 hours after food. Do not take with food unless you talk with your doctor. Follow the directions on the prescription label. Take your medicine at regular intervals. Do not take your medicine more often than directed. Do not stop taking except on your doctor's advice. Suddenly stopping the medicine can be very dangerous. Talk to your pediatrician regarding the use of this medicine in children. Patients over 65 years old may have a stronger reaction and need a smaller dose. Overdosage: If you think you have taken too much of this medicine contact a poison control center or emergency room at once. NOTE: This medicine is only for you. Do not share this medicine with others. What if I miss a dose? If you miss a dose, take it as soon as you can. If it is almost time for your next dose, take only that dose. Do not take double or extra doses. What may interact with this medicine? Do not take this medicine with any of the following medications: -ciprofloxacin -clonidine -fluvoxamine -guanabenz -guanfacine -methyldopa This medicine may also  interact with the following medications: -acyclovir -alcohol -antihistamines -baclofen -barbiturates like phenobarbital -benzodiazepines -cimetidine -famotidine -female hormones, like estrogens or progestins and birth control pills -medicines for high blood pressure -medicines for irregular heartbeat -medicines for pain like codeine, morphine, and hydrocodone -medicines for sleep -rofecoxib -some antibiotics like levofloxacin, ofloxacin -ticlopidine -zileuton This list may not describe all possible interactions. Give your health care provider a list of all the medicines, herbs, non-prescription drugs, or dietary supplements you use. Also tell them if you smoke, drink alcohol, or use illegal drugs. Some items may interact with your medicine. What should I watch for while using this medicine? You may get drowsy or dizzy. Do not drive, use machinery, or do anything that needs mental alertness until you know how this medicine affects you. Do not stand or sit up quickly, especially if you are an older patient. This reduces the risk of dizzy or fainting spells. Alcohol may interfere with the effect of this medicine. Avoid alcoholic drinks. Your mouth may get dry. Chewing sugarless gum or sucking hard candy, and drinking plenty of water may help. Contact your doctor if the problem does not go away or is severe. What side effects may I notice from receiving this medicine? Side effects that you should report to your doctor or health care professional as soon as possible: -allergic reactions like skin rash, itching or hives, swelling of the face, lips, or tongue -blurred vision -fainting spells -hallucinations -nausea or vomiting -nervousness -redness, blistering, peeling or loosening of the skin, including inside the mouth -slow or irregular heartbeat, palpitations, or chest pain -yellowing of   the skin or eyes Side effects that usually do not require medical attention (report to your doctor  or health care professional if they continue or are bothersome): -dizziness -drowsiness -dry mouth -tiredness or weakness This list may not describe all possible side effects. Call your doctor for medical advice about side effects. You may report side effects to FDA at 1-800-FDA-1088. Where should I keep my medicine? Keep out of the reach of children. Store at room temperature between 15 and 30 degrees C (59 and 86 degrees F). Throw away any unused medicine after the expiration date. NOTE: This sheet is a summary. It may not cover all possible information. If you have questions about this medicine, talk to your doctor, pharmacist, or health care provider.  2015, [employer]vier/Gold Standard. (2007-09-27 12:38:02)

## 2013-10-07 NOTE — Progress Notes (Signed)
PATIENT: Sonia Martinez DOB: 10/22/1980  REASON FOR VISIT: follow up HISTORY FROM: patient  HISTORY OF PRESENT ILLNESS: Sonia Martinez is a 33 year old female with a history of migraines. She returns today for follow-up. These headaches started after working-out and feeling a "pop" in her head. She was started on Topamax and referred for neuromuscular therapy.  She states that she took it for 7 weeks but could not withstand the side effects. She reports that she did go to neuromuscular therapy and reports that it was helpful. She states that she has approximately 4 headaches a week. She states that are usually a 3/10 on the pain scale. She has been going to a chiropractor and states that has offered the most benefit. Denies nausea/vomiting with the headaches. Her headaches normally start at the base of her neck and the travel up her head.   HISTORY 06/05/2013 (CW): 33 year old right-handed white female with a history of obesity and a history of headache. She indicates that she began having headaches in 2010 while working out. She indicates that she was lifting weights, and suddenly heard a pop, and then began having headaches coming up from the back of the head and around to the front of the head. The patient has had headaches since that time occurring 2 or 3 times a week, lasting anywhere from 2-3 hours to overnight. She indicates that sleep will sometimes help the headache. She denies any significant problems with nausea or vomiting, but on occasion she may feel presyncopal with her headaches with tunnel vision, but she never does she have loss of consciousness. She does report some photophobia and phonophobia with the headache. She does have allergies, but she does not clearly relate this to her headaches. She also describes some sinus headaches in the front of the head, different from her occipital headaches. She reports some intermittent numbness and tingling sensations in the hands and feet  since the onset of her headaches, but she does not relate this with her headaches at all times. She has been evaluated for carpal tunnel syndrome, and was never found to have this entity. She uses Tylenol or Excedrin Migraine for her headaches. She denies any family history of headache. She is sent to this office for an evaluation. Previously, she had MRI evaluation of the brain in February 2013 that was unremarkable. The patient does have some dizziness on occasion with the headache  REVIEW OF SYSTEMS: Full 14 system review of systems performed and notable only for:  Constitutional: N/A  Eyes: N/A Ear/Nose/Throat: N/A  Skin: N/A  Cardiovascular: N/A  Respiratory: N/A  Gastrointestinal: constipation   Genitourinary: N/A Hematology/Lymphatic: N/A  Endocrine: N/A Musculoskeletal: aching muscles, muscle cramps  Allergy/Immunology: N/A  Neurological: headache Psychiatric: N/A Sleep: N/A   ALLERGIES: Allergies  Allergen Reactions  . Ciprofloxacin Hives  . Coconut Oil Hives  . Hydrocortisone Hives    HOME MEDICATIONS: Outpatient Prescriptions Prior to Visit  Medication Sig Dispense Refill  . fluticasone (FLONASE) 50 MCG/ACT nasal spray Place 2 sprays into the nose daily. 2 sprays in each nostril      . L-Lysine 1000 MG TABS Take 1 tablet by mouth at bedtime.      . montelukast (SINGULAIR) 10 MG tablet Take 10 mg by mouth at bedtime.      . Multiple Vitamin (MULTIVITAMIN) tablet Take 1 tablet by mouth daily.      . Omega-3 Fatty Acids (FISH OIL) 1200 MG CAPS Take 1,200 mg by mouth  daily.      Marland Kitchen pyridoxine (B-6) 100 MG tablet Take 100 mg by mouth daily.      . ranitidine (ZANTAC) 150 MG tablet Take 150 mg by mouth as needed.       . rizatriptan (MAXALT) 10 MG tablet Take 1 tablet (10 mg total) by mouth 3 (three) times daily as needed for migraine. May repeat in 2 hours if needed  12 tablet  3  . topiramate (TOPAMAX) 25 MG tablet Take one tablet at night for one week, then take 2  tablets at night for one week, then take 3 tablets at night.  90 tablet  3  . vitamin C (ASCORBIC ACID) 500 MG tablet Take 500 mg by mouth daily.      . calcium carbonate (TUMS EX) 750 MG chewable tablet Chew 2 tablets by mouth daily as needed. Stomach acid      . loratadine (CLARITIN) 10 MG tablet Take 10 mg by mouth daily.      . SYMBICORT 160-4.5 MCG/ACT inhaler Inhale 2 puffs into the lungs 2 (two) times daily.      Marland Kitchen VIORELE 0.15-0.02/0.01 MG (21/5) tablet Take 1 tablet by mouth daily.       No facility-administered medications prior to visit.    PAST MEDICAL HISTORY: Past Medical History  Diagnosis Date  . Asthma   . Pregnancy induced hypertension   . Anxiety   . Migraine without aura, without mention of intractable migraine without mention of status migrainosus 06/05/2013  . IBS (irritable bowel syndrome)   . Obesity     PAST SURGICAL HISTORY: Past Surgical History  Procedure Laterality Date  . Wisdom tooth extraction    . Septoplasty      FAMILY HISTORY: Family History  Problem Relation Age of Onset  . Anesthesia problems Neg Hx   . Malignant hyperthermia Neg Hx   . Pseudochol deficiency Neg Hx   . Hypotension Neg Hx   . Migraines Neg Hx   . Hypertension Mother   . Cancer - Other Mother     uterine  . Heart disease Maternal Grandfather   . Diabetes Paternal Grandmother   . Lung cancer Paternal Grandfather   . Diabetes Paternal Grandfather     SOCIAL HISTORY: History   Social History  . Marital Status: Married    Spouse Name: N/A    Number of Children: 1  . Years of Education: MA   Occupational History  . 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 Smoking status: Never Smoker   . Smokeless tobacco: Never Used  . Alcohol Use: No  . Drug Use: No  . Sexual Activity: Yes   Other Topics Concern  . Not on file   Social History Narrative   Patient is married with one child.   Patient is right handed.   Patient has a  Master's degree.   Patient drinks 2 cups daily.      PHYSICAL EXAM  Filed Vitals:   10/07/13 0821  BP: 124/83  Pulse: 90  Height: 5' 7.5" (1.715 m)  Weight: 252 lb (114.306 kg)   Body mass index is 38.86 kg/(m^2).  Generalized: Well developed, in no acute distress   Neurological examination  Mentation: Alert oriented to time, place, history taking. Follows all commands speech and language fluent Cranial nerve II-XII: Pupils were equal round reactive to light. Extraocular movements were full, visual field were full on confrontational test. Facial  sensation and strength were normal. Uvula tongue midline. Head turning and shoulder shrug  were normal and symmetric. Motor: The motor testing reveals 5 over 5 strength of all 4 extremities. Good symmetric motor tone is noted throughout.  Sensory: Sensory testing is intact to soft touch on all 4 extremities. No evidence of extinction is noted.  Coordination: Cerebellar testing reveals good finger-nose-finger and heel-to-shin bilaterally.  Gait and station: Gait is normal. Tandem gait is normal. Romberg is negative. No drift is seen.  Reflexes: Deep tendon reflexes are symmetric and normal bilaterally.   DIAGNOSTIC DATA (LABS, IMAGING, TESTING) - I reviewed patient records, labs, notes, testing and imaging myself where available.  Lab Results  Component Value Date   WBC 9.5 02/26/2011   HGB 8.7* 02/26/2011   HCT 25.7* 02/26/2011   MCV 93.1 02/26/2011   PLT 165 02/26/2011      Component Value Date/Time   NA 142 02/27/2011 1522   K 3.4* 02/27/2011 1522   CL 106 02/27/2011 1522   CO2 26 02/27/2011 1522   GLUCOSE 94 02/27/2011 1522   BUN 6 02/27/2011 1522   CREATININE 0.68 02/27/2011 1522   CALCIUM 7.3* 02/27/2011 1522   PROT 4.9* 02/27/2011 1522   ALBUMIN 2.1* 02/27/2011 1522   AST 56* 02/27/2011 1522   ALT 45* 02/27/2011 1522   ALKPHOS 152* 02/27/2011 1522   BILITOT 0.4 02/27/2011 1522   GFRNONAA >90 02/27/2011 1522   GFRAA >90 02/27/2011 1522        ASSESSMENT AND PLAN 33 y.o. year old female  has a past medical history of Asthma; Pregnancy induced hypertension; Anxiety; Migraine without aura, without mention of intractable migraine without mention of status migrainosus (06/05/2013); IBS (irritable bowel syndrome); and Obesity. here with:  1. Migraines  Patient could not tolerate Topamax. She will start Tizanidine 2 mg at bedtime. She is very hesitant to try medication. She states that she will research tizanidine and then decide whether she will get it filled. She is also been going to the chiropractor and feels that it has offered some benefit. She was advised to let us know if her symptoms worsen or if she develops new symptoms. Otherwise she should follow up in 4 months or sooner if needed.    Butch Penny, MSN, NP-C 10/07/2013, 8:35 AM [facility] [address], [address] [place], [place] [address] [phone]  Note: This document was prepared with digital dictation and possible smart phrase technology. Any transcriptional errors that result from this process are unintentional.

## 2013-11-25 ENCOUNTER — Encounter: Payer: Self-pay | Admitting: Adult Health

## 2014-02-04 ENCOUNTER — Encounter: Payer: Self-pay | Admitting: Adult Health

## 2014-02-04 ENCOUNTER — Ambulatory Visit (INDEPENDENT_AMBULATORY_CARE_PROVIDER_SITE_OTHER): Payer: BLUE CROSS/BLUE SHIELD | Admitting: Adult Health

## 2014-02-04 VITALS — BP 114/75 | HR 74 | Ht 67.5 in | Wt 251.8 lb

## 2014-02-04 DIAGNOSIS — G43009 Migraine without aura, not intractable, without status migrainosus: Secondary | ICD-10-CM

## 2014-02-04 NOTE — Progress Notes (Signed)
PATIENT: Sonia Martinez DOB: 17-Jan-1981  REASON FOR VISIT: follow up- Migraine headache HISTORY FROM: patient  HISTORY OF PRESENT ILLNESS: Sonia Martinez 34 year old female with a history of migraine headaches. She returns today for follow-up. The patient was prescribed tizanidine 2 mg at bedtime but did not start this. She reports that her headaches have improved. She has approximately 2 headaches per month Her headaches are still located in the base of her neck and travels up to the occipital region. Denies photophobia or phonophobia. She reports that very seldom that she has nausea and some photophobia but not commonly. She states that she been going to the chiropractor and that has really helped. She states that she has been having some twitching in the left eye lately. She states that it may last a few minutes then resolve. She states that if she presses on her eye it will stop. No new medical history since last seen.   HISTORY 10/07/13: Sonia Martinez is a 34 year old female with a history of migraines. She returns today for follow-up. These headaches started after working-out and feeling a "pop" in her head. She was started on Topamax and referred for neuromuscular therapy.  She states that she took it for 7 weeks but could not withstand the side effects. She reports that she did go to neuromuscular therapy and reports that it was helpful. She states that she has approximately 4 headaches a week. She states that are usually a 3/10 on the pain scale. She has been going to a chiropractor and states that has offered the most benefit. Denies nausea/vomiting with the headaches. Her headaches normally start at the base of her neck and the travel up her head  REVIEW OF SYSTEMS: Out of a complete 14 system review of symptoms, the patient complains only of the following symptoms, and all other reviewed systems are negative.  cough   ALLERGIES: Allergies  Allergen Reactions  . Ciprofloxacin  Hives  . Coconut Oil Hives  . Hydrocortisone Hives    HOME MEDICATIONS: Outpatient Prescriptions Prior to Visit  Medication Sig Dispense Refill  . albuterol (VENTOLIN HFA) 108 (90 BASE) MCG/ACT inhaler Frequency:PRN   Dosage:90   MCG  Instructions:  Note:Dose: 90 MCG    . CAMILA 0.35 MG tablet     . fluticasone (FLONASE) 50 MCG/ACT nasal spray Place 2 sprays into the nose daily. 2 sprays in each nostril    . L-Lysine 1000 MG TABS Take 1 tablet by mouth at bedtime.    . montelukast (SINGULAIR) 10 MG tablet Take 10 mg by mouth at bedtime.    . Multiple Vitamin (MULTIVITAMIN) tablet Take 1 tablet by mouth daily.    . Omega-3 Fatty Acids (FISH OIL) 1200 MG CAPS Take 1,200 mg by mouth daily.    Marland Kitchen pyridoxine (B-6) 100 MG tablet Take 100 mg by mouth daily.    . ranitidine (ZANTAC) 150 MG tablet Take 150 mg by mouth as needed.     . rizatriptan (MAXALT) 10 MG tablet Take 1 tablet (10 mg total) by mouth 3 (three) times daily as needed for migraine. May repeat in 2 hours if needed 12 tablet 3  . tizanidine (ZANAFLEX) 2 MG capsule Take 1 capsule (2 mg total) by mouth at bedtime. 30 capsule 0  . topiramate (TOPAMAX) 25 MG tablet Take one tablet at night for one week, then take 2 tablets at night for one week, then take 3 tablets at night. 90 tablet 3  .  valACYclovir (VALTREX) 500 MG tablet Take 500 mg by mouth.    . vitamin C (ASCORBIC ACID) 500 MG tablet Take 500 mg by mouth daily.     No facility-administered medications prior to visit.    PAST MEDICAL HISTORY: Past Medical History  Diagnosis Date  . Asthma   . Pregnancy induced hypertension   . Anxiety   . Migraine without aura, without mention of intractable migraine without mention of status migrainosus 06/05/2013  . IBS (irritable bowel syndrome)   . Obesity     PAST SURGICAL HISTORY: Past Surgical History  Procedure Laterality Date  . Wisdom tooth extraction    . Septoplasty      FAMILY HISTORY: Family History  Problem  Relation Age of Onset  . Anesthesia problems Neg Hx   . Malignant hyperthermia Neg Hx   . Pseudochol deficiency Neg Hx   . Hypotension Neg Hx   . Migraines Neg Hx   . Hypertension Mother   . Cancer - Other Mother     uterine  . Heart disease Maternal Grandfather   . Diabetes Paternal Grandmother   . Lung cancer Paternal Grandfather   . Diabetes Paternal Grandfather     SOCIAL HISTORY: History   Social History  . Marital Status: Married    Spouse Name: N/A    Number of Children: 1  . Years of Education: MA   Occupational History  . 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 Smoking status: Never Smoker   . Smokeless tobacco: Never Used  . Alcohol Use: No  . Drug Use: No  . Sexual Activity: Yes   Other Topics Concern  . Not on file   Social History Narrative   Patient is married with one child.   Patient is right handed.   Patient has a Master's degree.   Patient drinks 2 cups daily.      PHYSICAL EXAM  Filed Vitals:   02/04/14 0833  BP: 114/75  Pulse: 74  Height: 5' 7.5" (1.715 m)  Weight: 251 lb 12.8 oz (114.216 kg)   Body mass index is 38.83 kg/(m^2).  Generalized: Well developed, in no acute distress   Neurological examination  Mentation: Alert oriented to time, place, history taking. Follows all commands speech and language fluent Cranial nerve II-XII: Pupils were equal round reactive to light. Extraocular movements were full, visual field were full on confrontational test. Facial sensation and strength were normal. Uvula tongue midline- white exudate on Uvula and soft palate. Head turning and shoulder shrug  were normal and symmetric. Motor: The motor testing reveals 5 over 5 strength of all 4 extremities. Good symmetric motor tone is noted throughout.  Sensory: Sensory testing is intact to soft touch on all 4 extremities. No evidence of extinction is noted.  Coordination: Cerebellar testing reveals good finger-nose-finger  and heel-to-shin bilaterally.  Gait and station: Gait is normal. Tandem gait is normal. Romberg is negative. No drift is seen.  Reflexes: Deep tendon reflexes are symmetric and normal bilaterally.    DIAGNOSTIC DATA (LABS, IMAGING, TESTING) - I reviewed patient records, labs, notes, testing and imaging myself where available.  Lab Results  Component Value Date   WBC 9.5 02/26/2011   HGB 8.7* 02/26/2011   HCT 25.7* 02/26/2011   MCV 93.1 02/26/2011   PLT 165 02/26/2011      Component Value Date/Time   NA 142 02/27/2011 1522   K 3.4* 02/27/2011 1522  CL 106 02/27/2011 1522   CO2 26 02/27/2011 1522   GLUCOSE 94 02/27/2011 1522   BUN 6 02/27/2011 1522   CREATININE 0.68 02/27/2011 1522   CALCIUM 7.3* 02/27/2011 1522   PROT 4.9* 02/27/2011 1522   ALBUMIN 2.1* 02/27/2011 1522   AST 56* 02/27/2011 1522   ALT 45* 02/27/2011 1522   ALKPHOS 152* 02/27/2011 1522   BILITOT 0.4 02/27/2011 1522   GFRNONAA >90 02/27/2011 1522   GFRAA >90 02/27/2011 1522      ASSESSMENT AND PLAN 34 y.o. year old female  has a past medical history of Asthma; Pregnancy induced hypertension; Anxiety; Migraine without aura, without mention of intractable migraine without mention of status migrainosus (06/05/2013); IBS (irritable bowel syndrome); and Obesity. here with:  1. Migraines  Overall the patient is doing well.  Headaches have reduced to 2 a month. She is not currently taking any medications for the headaches.  Patient reports that she currently has a cold- on exam she had white exudate on the uvula and soft palate. I have advised her to follow-up with her PCP.  Patient will let us know if her headache frequency increases.  She will follow-up in 1 year.       Butch PennyMegan Kelvyn Schunk, MSN, NP-C 02/04/2014, 8:38 AM [facility] [address], [address] [place], [place] [address] [phone]  Note: This document was prepared with digital dictation and possible smart phrase technology.  Any transcriptional errors that result from this process are unintentional.

## 2014-02-04 NOTE — Patient Instructions (Signed)
Overall you are doing well.  If your headache frequency increases please let us know.

## 2015-01-25 NOTE — L&D Delivery Note (Signed)
Delivery Note At 4:38 AM a viable female was delivered via Vaginal, Spontaneous Delivery OA Presentation .  APGAR:  9 9  weight pending .   Placenta spontaneously with 3 vessel cord: , .  Cord:  with the following complications:none .  Cord pH: not obtained  Anesthesia: epidural  Episiotomy:  none Lacerations:  first Suture Repair: 3.0 chromic Est. Blood Loss (mL):  300  Mom to postpartum.  Baby to Couplet care / Skin to Skin.  Heleena Miceli L 01/06/2016, 4:56 AM

## 2015-02-05 ENCOUNTER — Ambulatory Visit: Payer: BLUE CROSS/BLUE SHIELD | Admitting: Adult Health

## 2015-02-11 ENCOUNTER — Ambulatory Visit: Payer: BLUE CROSS/BLUE SHIELD | Admitting: Adult Health

## 2015-02-23 ENCOUNTER — Encounter: Payer: Self-pay | Admitting: Adult Health

## 2015-02-23 ENCOUNTER — Ambulatory Visit (INDEPENDENT_AMBULATORY_CARE_PROVIDER_SITE_OTHER): Payer: Managed Care, Other (non HMO) | Admitting: Adult Health

## 2015-02-23 VITALS — BP 112/75 | HR 75 | Resp 20 | Ht 67.5 in | Wt 259.0 lb

## 2015-02-23 DIAGNOSIS — R51 Headache: Secondary | ICD-10-CM | POA: Diagnosis not present

## 2015-02-23 DIAGNOSIS — G4486 Cervicogenic headache: Secondary | ICD-10-CM

## 2015-02-23 MED ORDER — TIZANIDINE HCL 2 MG PO TABS: 2.0000 mg | ORAL_TABLET | Freq: Every day | ORAL | Status: DC

## 2015-02-23 NOTE — Patient Instructions (Signed)
Continue Tizanidine 2 mg at bedtime If your symptoms worsen or you develop new symptoms please let us know.

## 2015-02-23 NOTE — Progress Notes (Signed)
I have read the note, and I agree with the clinical assessment and plan.  Sonia Martinez   

## 2015-02-23 NOTE — Progress Notes (Signed)
PATIENT: Sonia Martinez DOB: 1980-07-27  REASON FOR VISIT: follow up- migraine headaches HISTORY FROM: patient  HISTORY OF PRESENT ILLNESS: Sonia Martinez is a 35 year old female with a history of migraine headaches. She returns today for follow-up. She states that recently her migraine frequency has increased. She states that her headaches normally start in the base of the neck and radiate to the temporal region bilaterally. She is having approximately 1-2 headaches a week. She states that she has been going to the chiropractor every 2-3 weeks and she is feels like that has helped with her migraine frequency. She states that when she does get a headache is a dull achy pain. She rates her pain on the pain scale as a 3 out of 10. Occasionally she will have nausea but no vomiting. And with more severe headache she will have photophobia. In the past the patient has tried Topamax and Maxalt but was unable to tolerate these medications due to the side effects. She continues to use tizanidine as needed. She states that she does not take this medication consistently. She returns today for an evaluation.  HISTORY 02/04/14: Sonia Martinez 35 year old female with a history of migraine headaches. She returns today for follow-up. The patient was prescribed tizanidine 2 mg at bedtime but did not start this. She reports that her headaches have improved. She has approximately 2 headaches per month Her headaches are still located in the base of her neck and travels up to the occipital region. Denies photophobia or phonophobia. She reports that very seldom that she has nausea and some photophobia but not commonly. She states that she been going to the chiropractor and that has really helped. She states that she has been having some twitching in the left eye lately. She states that it may last a few minutes then resolve. She states that if she presses on her eye it will stop. No new medical history since last seen.    HISTORY 10/07/13: Sonia Martinez is a 35 year old female with a history of migraines. She returns today for follow-up. These headaches started after working-out and feeling a "pop" in her head. She was started on Topamax and referred for neuromuscular therapy. She states that she took it for 7 weeks but could not withstand the side effects. She reports that she did go to neuromuscular therapy and reports that it was helpful. She states that she has approximately 4 headaches a week. She states that are usually a 3/10 on the pain scale. She has been going to a chiropractor and states that has offered the most benefit. Denies nausea/vomiting with the headaches. Her headaches normally start at the base of her neck and the travel up her head  REVIEW OF SYSTEMS: Out of a complete 14 system review of symptoms, the patient complains only of the following symptoms, and all other reviewed systems are negative.  See history of present illness  ALLERGIES: Allergies  Allergen Reactions  . Ciprofloxacin Hives  . Coconut Oil Hives  . Hydrocortisone Hives    HOME MEDICATIONS: Outpatient Prescriptions Prior to Visit  Medication Sig Dispense Refill  . albuterol (VENTOLIN HFA) 108 (90 BASE) MCG/ACT inhaler Frequency:PRN   Dosage:90   MCG  Instructions:  Note:Dose: 90 MCG    . CAMILA 0.35 MG tablet     . fluticasone (FLONASE) 50 MCG/ACT nasal spray Place 2 sprays into the nose daily. 2 sprays in each nostril    . L-Lysine 1000 MG TABS Take 1 tablet  by mouth at bedtime.    . montelukast (SINGULAIR) 10 MG tablet Take 10 mg by mouth at bedtime.    . Multiple Vitamin (MULTIVITAMIN) tablet Take 1 tablet by mouth daily.    . Omega-3 Fatty Acids (FISH OIL) 1200 MG CAPS Take 1,200 mg by mouth daily.    Marland Kitchen pyridoxine (B-6) 100 MG tablet Take 100 mg by mouth daily.    . ranitidine (ZANTAC) 150 MG tablet Take 150 mg by mouth as needed.     . rizatriptan (MAXALT) 10 MG tablet Take 1 tablet (10 mg total) by mouth 3  (three) times daily as needed for migraine. May repeat in 2 hours if needed 12 tablet 3  . valACYclovir (VALTREX) 500 MG tablet Take 500 mg by mouth.    . vitamin C (ASCORBIC ACID) 500 MG tablet Take 500 mg by mouth daily.     No facility-administered medications prior to visit.    PAST MEDICAL HISTORY: Past Medical History  Diagnosis Date  . Asthma   . Pregnancy induced hypertension   . Anxiety   . Migraine without aura, without mention of intractable migraine without mention of status migrainosus 06/05/2013  . IBS (irritable bowel syndrome)   . Obesity     PAST SURGICAL HISTORY: Past Surgical History  Procedure Laterality Date  . Wisdom tooth extraction    . Septoplasty      FAMILY HISTORY: Family History  Problem Relation Age of Onset  . Anesthesia problems Neg Hx   . Malignant hyperthermia Neg Hx   . Pseudochol deficiency Neg Hx   . Hypotension Neg Hx   . Migraines Neg Hx   . Hypertension Mother   . Cancer - Other Mother     uterine  . Heart disease Maternal Grandfather   . Diabetes Paternal Grandmother   . Lung cancer Paternal Grandfather   . Diabetes Paternal Grandfather     SOCIAL HISTORY: Social History   Social History  . Marital Status: Married    Spouse Name: N/A  . Number of Children: 1  . Years of Education: MA   Occupational History  . Finance Manager     [employer]   Social History Main Topics  . Smoking status: Never Smoker   . Smokeless tobacco: Never Used  . Alcohol Use: No  . Drug Use: No  . Sexual Activity: Yes   Other Topics Concern  . Not on file   Social History Narrative   Patient is married with one child.   Patient is right handed.   Patient has a Master's degree.   Patient drinks 2 cups daily.      PHYSICAL EXAM  Filed Vitals:   02/23/15 0739  BP: 112/75  Pulse: 75  Resp: 20  Height: 5' 7.5" (1.715 m)  Weight: 259 lb (117.482 kg)   Body mass index is 39.94 kg/(m^2).  Generalized: Well developed, in no  acute distress   Neurological examination  Mentation: Alert oriented to time, place, history taking. Follows all commands speech and language fluent Cranial nerve II-XII: Pupils were equal round reactive to light. Extraocular movements were full, visual field were full on confrontational test. Facial sensation and strength were normal. Uvula tongue midline. Head turning and shoulder shrug  were normal and symmetric. Motor: The motor testing reveals 5 over 5 strength of all 4 extremities. Good symmetric motor tone is noted throughout.  Sensory: Sensory testing is intact to soft touch on all 4 extremities. No evidence of extinction  is noted.  Coordination: Cerebellar testing reveals good finger-nose-finger and heel-to-shin bilaterally.  Gait and station: Gait is normal. Tandem gait is normal. Romberg is negative. No drift is seen.  Reflexes: Deep tendon reflexes are symmetric and normal bilaterally.   DIAGNOSTIC DATA (LABS, IMAGING, TESTING) - I reviewed patient records, labs, notes, testing and imaging myself where available.   ASSESSMENT AND PLAN 35 y.o. year old female  has a past medical history of Asthma; Pregnancy induced hypertension; Anxiety; Migraine without aura, without mention of intractable migraine without mention of status migrainosus (06/05/2013); IBS (irritable bowel syndrome); and Obesity. here with:  1. Cervicogenic headache  The patient has approximately 1-2 headaches a week. Her headaches are not severe at this time. I have instructed the patient that she can try taking tizanidine consistently at bedtime. Patient verbalized understanding. If this is not beneficial she will let us know. She will follow-up in 3 months or sooner if needed.  Butch Penny, MSN, NP-C 02/23/2015, 7:43 AM [facility] [address], [address] [place], [place] [address] [phone]

## 2015-03-18 ENCOUNTER — Telehealth: Payer: Self-pay | Admitting: Adult Health

## 2015-03-18 NOTE — Telephone Encounter (Signed)
Patient called to advise when here on 02/23/15 she was prescribed tiZANidine (ZANAFLEX) 2 MG tablet, states it's actually making severity and instance of migraines worse.

## 2015-03-18 NOTE — Telephone Encounter (Signed)
Called pt back and relayed message. Pt verbalized understanding.

## 2015-03-18 NOTE — Telephone Encounter (Signed)
Per Aundra Millet- Pt can go back to taking tizanidine prn and not continuously.

## 2015-05-25 ENCOUNTER — Ambulatory Visit: Payer: Managed Care, Other (non HMO) | Admitting: Adult Health

## 2015-07-14 ENCOUNTER — Other Ambulatory Visit: Payer: Self-pay | Admitting: Obstetrics and Gynecology

## 2015-08-06 LAB — OB RESULTS CONSOLE HIV ANTIBODY (ROUTINE TESTING): HIV: NONREACTIVE

## 2015-08-06 LAB — OB RESULTS CONSOLE RPR: RPR: NONREACTIVE

## 2015-08-06 LAB — OB RESULTS CONSOLE HEPATITIS B SURFACE ANTIGEN: HEP B S AG: NEGATIVE

## 2015-08-06 LAB — OB RESULTS CONSOLE GC/CHLAMYDIA
CHLAMYDIA, DNA PROBE: NEGATIVE
GC PROBE AMP, GENITAL: NEGATIVE

## 2015-08-16 ENCOUNTER — Encounter (HOSPITAL_COMMUNITY): Payer: Self-pay

## 2015-08-16 ENCOUNTER — Inpatient Hospital Stay (HOSPITAL_COMMUNITY)
Admission: AD | Admit: 2015-08-16 | Discharge: 2015-08-16 | Disposition: A | Discharge status: Home / Self Care | Payer: 59 | Source: Ambulatory Visit | Attending: Obstetrics and Gynecology | Admitting: Obstetrics and Gynecology

## 2015-08-16 DIAGNOSIS — E669 Obesity, unspecified: Secondary | ICD-10-CM | POA: Diagnosis not present

## 2015-08-16 DIAGNOSIS — R300 Dysuria: Secondary | ICD-10-CM | POA: Diagnosis not present

## 2015-08-16 DIAGNOSIS — F419 Anxiety disorder, unspecified: Secondary | ICD-10-CM | POA: Insufficient documentation

## 2015-08-16 DIAGNOSIS — Z3A17 17 weeks gestation of pregnancy: Secondary | ICD-10-CM | POA: Diagnosis not present

## 2015-08-16 DIAGNOSIS — Z79899 Other long term (current) drug therapy: Secondary | ICD-10-CM | POA: Insufficient documentation

## 2015-08-16 DIAGNOSIS — O99512 Diseases of the respiratory system complicating pregnancy, second trimester: Secondary | ICD-10-CM | POA: Insufficient documentation

## 2015-08-16 DIAGNOSIS — O9989 Other specified diseases and conditions complicating pregnancy, childbirth and the puerperium: Secondary | ICD-10-CM

## 2015-08-16 DIAGNOSIS — O99612 Diseases of the digestive system complicating pregnancy, second trimester: Secondary | ICD-10-CM | POA: Diagnosis not present

## 2015-08-16 DIAGNOSIS — O99342 Other mental disorders complicating pregnancy, second trimester: Secondary | ICD-10-CM | POA: Insufficient documentation

## 2015-08-16 DIAGNOSIS — J45909 Unspecified asthma, uncomplicated: Secondary | ICD-10-CM | POA: Diagnosis not present

## 2015-08-16 DIAGNOSIS — K589 Irritable bowel syndrome without diarrhea: Secondary | ICD-10-CM | POA: Diagnosis not present

## 2015-08-16 DIAGNOSIS — O99212 Obesity complicating pregnancy, second trimester: Secondary | ICD-10-CM | POA: Insufficient documentation

## 2015-08-16 DIAGNOSIS — N949 Unspecified condition associated with female genital organs and menstrual cycle: Secondary | ICD-10-CM | POA: Diagnosis not present

## 2015-08-16 DIAGNOSIS — O26892 Other specified pregnancy related conditions, second trimester: Secondary | ICD-10-CM | POA: Diagnosis not present

## 2015-08-16 DIAGNOSIS — R350 Frequency of micturition: Secondary | ICD-10-CM | POA: Diagnosis not present

## 2015-08-16 DIAGNOSIS — R102 Pelvic and perineal pain: Secondary | ICD-10-CM

## 2015-08-16 DIAGNOSIS — O26899 Other specified pregnancy related conditions, unspecified trimester: Secondary | ICD-10-CM

## 2015-08-16 LAB — URINALYSIS, ROUTINE W REFLEX MICROSCOPIC
Bilirubin Urine: NEGATIVE
Glucose, UA: NEGATIVE mg/dL
Hgb urine dipstick: NEGATIVE
Ketones, ur: NEGATIVE mg/dL
Leukocytes, UA: NEGATIVE
Nitrite: NEGATIVE
Protein, ur: NEGATIVE mg/dL
SPECIFIC GRAVITY, URINE: 1.025 (ref 1.005–1.030)
pH: 5.5 (ref 5.0–8.0)

## 2015-08-16 MED ORDER — PHENAZOPYRIDINE HCL 100 MG PO TABS: 100.0000 mg | ORAL_TABLET | Freq: Three times a day (TID) | ORAL | 0 refills | Status: DC | PRN

## 2015-08-16 NOTE — MAU Provider Note (Signed)
History     CSN: [id number]  Arrival date and time: 08/16/15 1825   First Provider Initiated Contact with Patient 08/16/15 1855      Chief Complaint  Patient presents with  . Urinary Frequency   Sonia Martinez is a 35 y.o. G2P1001 at [redacted]w[redacted]d presenting with dysuria, urgency and frequency of urination. She is also having diffuse back pain mostly lower back. Pain is worse with walking and alleviated by rest.     Dysuria  Associated symptoms include urgency. Pertinent negatives include no chills, discharge, flank pain, frequency, hematuria, hesitancy or nausea. There is no history of kidney stones or recurrent UTIs.  Dysuria   This is a new problem. The current episode started today. The problem occurs every urination. The problem has been unchanged. The quality of the pain is described as burning. The pain is at a severity of 4/10. The pain is moderate. She is not sexually active. There is no history of pyelonephritis. Associated symptoms include urgency. Pertinent negatives include no chills, discharge, flank pain, frequency, hematuria, hesitancy or nausea. Associated symptoms comments: Frequency and nocturia (3-4x/night)  x several weeks. Urgency is constant. She has tried acetaminophen (Took Tylenonl for H/A this am) for the symptoms. The treatment provided no relief. There is no history of kidney stones or recurrent UTIs.    OB History  Gravida Para Term Preterm AB Living  2 1 1     1   SAB TAB Ectopic Multiple Live Births               # Outcome Date GA Lbr Len/2nd Weight Sex Delivery Anes PTL Lv  2 Current           1 Term 02/25/11 [redacted]w[redacted]d 07:45 / 00:36 6 lb 11.8 oz (3.056 kg) F Vag-Spont EPI  LIV     Birth Comments: caput, moulding       Past Medical History:  Diagnosis Date  . Anxiety   . Asthma   . IBS (irritable bowel syndrome)   . Migraine without aura, without mention of intractable migraine without mention of status migrainosus 06/05/2013  . Obesity   .  Pregnancy induced hypertension   Neg hx UTIs or stones  Past Surgical History:  Procedure Laterality Date  . SEPTOPLASTY    . WISDOM TOOTH EXTRACTION      Family History  Problem Relation Age of Onset  . Hypertension Mother   . Cancer - Other Mother     uterine  . Heart disease Maternal Grandfather   . Diabetes Paternal Grandmother   . Lung cancer Paternal Grandfather   . Diabetes Paternal Grandfather   . Anesthesia problems Neg Hx   . Malignant hyperthermia Neg Hx   . Pseudochol deficiency Neg Hx   . Hypotension Neg Hx   . Migraines Neg Hx     Social History  Substance Use Topics  . Smoking status: Never Smoker  . Smokeless tobacco: Never Used  . Alcohol use No    Allergies:  Allergies  Allergen Reactions  . Ciprofloxacin Hives  . Coconut Oil Hives  . Hydrocortisone Hives    Prescriptions Prior to Admission  Medication Sig Dispense Refill Last Dose  . albuterol (VENTOLIN HFA) 108 (90 BASE) MCG/ACT inhaler Frequency:PRN   Dosage:90   MCG  Instructions:  Note:Dose: 90 MCG   Taking  . CAMILA 0.35 MG tablet    Taking  . fluticasone (FLONASE) 50 MCG/ACT nasal spray Place 2 sprays into the nose daily. 2 sprays  in each nostril   Taking  . L-Lysine 1000 MG TABS Take 1 tablet by mouth at bedtime.   Taking  . montelukast (SINGULAIR) 10 MG tablet Take 10 mg by mouth at bedtime.   Taking  . Multiple Vitamin (MULTIVITAMIN) tablet Take 1 tablet by mouth daily.   Taking  . Omega-3 Fatty Acids (FISH OIL) 1200 MG CAPS Take 1,200 mg by mouth daily.   Taking  . pyridoxine (B-6) 100 MG tablet Take 100 mg by mouth daily.   Taking  . ranitidine (ZANTAC) 150 MG tablet Take 150 mg by mouth as needed.    Taking  . rizatriptan (MAXALT) 10 MG tablet Take 1 tablet (10 mg total) by mouth 3 (three) times daily as needed for migraine. May repeat in 2 hours if needed 12 tablet 3 Taking  . tiZANidine (ZANAFLEX) 2 MG tablet Take 1 tablet (2 mg total) by mouth at bedtime. 30 tablet 5   .  valACYclovir (VALTREX) 500 MG tablet Take 500 mg by mouth.   Taking  . vitamin C (ASCORBIC ACID) 500 MG tablet Take 500 mg by mouth daily.   Taking    Review of Systems  Constitutional: Negative for chills, fever and malaise/fatigue.  Gastrointestinal: Negative for nausea.  Genitourinary: Positive for urgency. Negative for dysuria, flank pain, frequency, hematuria and hesitancy.  Musculoskeletal: Positive for back pain.       Bilateral low back pain radiates to groin. Also some upper back "soreness" R>L  Neurological: Negative for dizziness and tingling.   Physical Exam   Blood pressure (!) 100/53, pulse 100, temperature 98.5 F (36.9 C), temperature source Oral, resp. rate 20, unknown if currently breastfeeding.  Physical Exam  Nursing note and vitals reviewed. Constitutional: She is oriented to person, place, and time. She appears well-developed and well-nourished. No distress.  HENT:  Head: Normocephalic.  Eyes: Pupils are equal, round, and reactive to light.  Neck: Normal range of motion. Neck supple.  Cardiovascular: Normal rate.   Respiratory: Effort normal.  GI: Soft. There is no tenderness.  Genitourinary:  Genitourinary Comments: NEFG. BUS ng. Cx posterior, long, closed  Musculoskeletal: Normal range of motion. She exhibits tenderness.  Minimal Right CVAT. Mild L-S paraspinous tenderness  Neurological: She is alert and oriented to person, place, and time.  Skin: Skin is warm and dry.  Psychiatric: She has a normal mood and affect. Her behavior is normal.    MAU Course  Procedures Results for orders placed or performed during the hospital encounter of 08/16/15 (from the past 24 hour(s))  Urinalysis, Routine w reflex microscopic (not at [facility])     Status: None   Collection Time: 08/16/15  6:33 PM  Result Value Ref Range   Color, Urine YELLOW YELLOW   APPearance CLEAR CLEAR   Specific Gravity, Urine 1.025 1.005 - 1.030   pH 5.5 5.0 - 8.0   Glucose, UA NEGATIVE  NEGATIVE mg/dL   Hgb urine dipstick NEGATIVE NEGATIVE   Bilirubin Urine NEGATIVE NEGATIVE   Ketones, ur NEGATIVE NEGATIVE mg/dL   Protein, ur NEGATIVE NEGATIVE mg/dL   Nitrite NEGATIVE NEGATIVE   Leukocytes, UA NEGATIVE NEGATIVE   C/W Dr. Rana Snare Discharge with reassurance and increase fluids.   Assessment and Plan   G2P1001 at [redacted]w[redacted]d 1. Pain of round ligament affecting pregnancy, antepartum   2. Dysuria    Dysuria etiology unclear> trial of Pyridium and push fluids   Medication List    STOP taking these medications   CAMILA 0.35 MG tablet  Generic drug:  norethindrone   rizatriptan 10 MG tablet Commonly known as:  MAXALT   tiZANidine 2 MG tablet Commonly known as:  ZANAFLEX   VALTREX 500 MG tablet Generic drug:  valACYclovir     TAKE these medications   Fish Oil 1200 MG Caps Take 1,200 mg by mouth daily.   fluticasone 50 MCG/ACT nasal spray Commonly known as:  FLONASE Place 2 sprays into the nose daily. 2 sprays in each nostril   L-Lysine 1000 MG Tabs Take 1 tablet by mouth at bedtime.   montelukast 10 MG tablet Commonly known as:  SINGULAIR Take 10 mg by mouth at bedtime.   multivitamin tablet Take 1 tablet by mouth daily.   phenazopyridine 100 MG tablet Commonly known as:  PYRIDIUM Take 1 tablet (100 mg total) by mouth 3 (three) times daily as needed for pain.   pyridoxine 100 MG tablet Commonly known as:  B-6 Take 100 mg by mouth daily.   ranitidine 150 MG tablet Commonly known as:  ZANTAC Take 150 mg by mouth as needed.   VENTOLIN HFA 108 (90 Base) MCG/ACT inhaler Generic drug:  albuterol Frequency:PRN   Dosage:90   MCG  Instructions:  Note:Dose: 90 MCG   vitamin C 500 MG tablet Commonly known as:  ASCORBIC ACID Take 500 mg by mouth daily.      Follow-up Information    [facility] .   Why:  Keep your Prenatal appointment as scheduled Contact information: [address] [place]  [address] [phone]          Marielis Samara 08/16/2015, 6:56 PM

## 2015-08-16 NOTE — MAU Note (Signed)
Pt states she feels the urge to urinate frequently and has a low back ache. Pt denies ctxs, LOF, vaginal bleeding. Pt states she can feel her baby move.

## 2015-08-16 NOTE — Discharge Instructions (Signed)
Dysuria Dysuria is pain or discomfort while urinating. The pain or discomfort may be felt in the tube that carries urine out of the bladder (urethra) or in the surrounding tissue of the genitals. The pain may also be felt in the groin area, lower abdomen, and lower back. You may have to urinate frequently or have the sudden feeling that you have to urinate (urgency). Dysuria can affect both men and women, but is more common in women. Dysuria can be caused by many different things, including:  Urinary tract infection in women.  Infection of the kidney or bladder.  Kidney stones or bladder stones.  Certain sexually transmitted infections (STIs), such as chlamydia.  Dehydration.  Inflammation of the vagina.  Use of certain medicines.  Use of certain soaps or scented products that cause irritation. HOME CARE INSTRUCTIONS Watch your dysuria for any changes. The following actions may help to reduce any discomfort you are feeling:  Drink enough fluid to keep your urine clear or pale yellow.  Empty your bladder often. Avoid holding urine for long periods of time.  After a bowel movement or urination, women should cleanse from front to back, using each tissue only once.  Empty your bladder after sexual intercourse.  Take medicines only as directed by your health care provider.  If you were prescribed an antibiotic medicine, finish it all even if you start to feel better.  Avoid caffeine, tea, and alcohol. They can irritate the bladder and make dysuria worse. In men, alcohol may irritate the prostate.  Keep all follow-up visits as directed by your health care provider. This is important.  If you had any tests done to find the cause of dysuria, it is your responsibility to obtain your test results. Ask the lab or department performing the test when and how you will get your results. Talk with your health care provider if you have any questions about your results. SEEK MEDICAL CARE  IF:  You develop pain in your back or sides.  You have a fever.  You have nausea or vomiting.  You have blood in your urine.  You are not urinating as often as you usually do. SEEK IMMEDIATE MEDICAL CARE IF:  You pain is severe and not relieved with medicines.  You are unable to hold down any fluids.  You or someone else notices a change in your mental function.  You have a rapid heartbeat at rest.  You have shaking or chills.  You feel extremely weak.   This information is not intended to replace advice given to you by your health care provider. Make sure you discuss any questions you have with your health care provider.   Document Released: 10/09/2003 Document Revised: 01/31/2014 Document Reviewed: 09/05/2013 Elsevier Interactive Patient Education 2016 [employer] Round Ligament Pain The round ligament is a cord of muscle and tissue that helps to support the uterus. It can become a source of pain during pregnancy if it becomes stretched or twisted as the baby grows. The pain usually begins in the second trimester of pregnancy, and it can come and go until the baby is delivered. It is not a serious problem, and it does not cause harm to the baby. Round ligament pain is usually a short, sharp, and pinching pain, but it can also be a dull, lingering, and aching pain. The pain is felt in the lower side of the abdomen or in the groin. It usually starts deep in the groin and moves up to the outside  of the hip area. Pain can occur with:  A sudden change in position.  Rolling over in bed.  Coughing or sneezing.  Physical activity. HOME CARE INSTRUCTIONS Watch your condition for any changes. Take these steps to help with your pain:  When the pain starts, relax. Then try:  Sitting down.  Flexing your knees up to your abdomen.  Lying on your side with one pillow under your abdomen and another pillow between your legs.  Sitting in a warm bath for 15-20 minutes or until the  pain goes away.  Take over-the-counter and prescription medicines only as told by your health care provider.  Move slowly when you sit and stand.  Avoid long walks if they cause pain.  Stop or lessen your physical activities if they cause pain. SEEK MEDICAL CARE IF:  Your pain does not go away with treatment.  You feel pain in your back that you did not have before.  Your medicine is not helping. SEEK IMMEDIATE MEDICAL CARE IF:  You develop a fever or chills.  You develop uterine contractions.  You develop vaginal bleeding.  You develop nausea or vomiting.  You develop diarrhea.  You have pain when you urinate.   This information is not intended to replace advice given to you by your health care provider. Make sure you discuss any questions you have with your health care provider.   Document Released: 10/20/2007 Document Revised: 04/04/2011 Document Reviewed: 03/19/2014 Elsevier Interactive Patient Education [employer]

## 2015-08-24 ENCOUNTER — Other Ambulatory Visit: Payer: Self-pay | Admitting: Obstetrics and Gynecology

## 2015-08-26 MED FILL — ADVAIR 100/50 DISKUS: 100-50 | 90 days supply | Qty: 180 | Fill #0

## 2015-08-31 ENCOUNTER — Other Ambulatory Visit (HOSPITAL_COMMUNITY): Payer: Self-pay | Admitting: Obstetrics and Gynecology

## 2015-08-31 DIAGNOSIS — Z3A19 19 weeks gestation of pregnancy: Secondary | ICD-10-CM

## 2015-08-31 DIAGNOSIS — Z3689 Encounter for other specified antenatal screening: Secondary | ICD-10-CM

## 2015-09-01 ENCOUNTER — Encounter (HOSPITAL_COMMUNITY): Payer: Self-pay

## 2015-09-01 ENCOUNTER — Ambulatory Visit (HOSPITAL_COMMUNITY)
Admission: RE | Admit: 2015-09-01 | Discharge: 2015-09-01 | Disposition: A | Discharge status: Home / Self Care | Payer: 59 | Source: Ambulatory Visit | Attending: Obstetrics and Gynecology | Admitting: Obstetrics and Gynecology

## 2015-09-01 ENCOUNTER — Other Ambulatory Visit (HOSPITAL_COMMUNITY): Payer: Self-pay | Admitting: Obstetrics and Gynecology

## 2015-09-01 VITALS — BP 127/58 | HR 76 | Wt 259.4 lb

## 2015-09-01 DIAGNOSIS — E669 Obesity, unspecified: Secondary | ICD-10-CM | POA: Insufficient documentation

## 2015-09-01 DIAGNOSIS — Z3689 Encounter for other specified antenatal screening: Secondary | ICD-10-CM

## 2015-09-01 DIAGNOSIS — O4442 Low lying placenta NOS or without hemorrhage, second trimester: Secondary | ICD-10-CM | POA: Diagnosis not present

## 2015-09-01 DIAGNOSIS — O289 Unspecified abnormal findings on antenatal screening of mother: Secondary | ICD-10-CM | POA: Diagnosis not present

## 2015-09-01 DIAGNOSIS — O280 Abnormal hematological finding on antenatal screening of mother: Secondary | ICD-10-CM | POA: Insufficient documentation

## 2015-09-01 DIAGNOSIS — Z3A19 19 weeks gestation of pregnancy: Secondary | ICD-10-CM

## 2015-09-01 DIAGNOSIS — O99212 Obesity complicating pregnancy, second trimester: Secondary | ICD-10-CM | POA: Insufficient documentation

## 2015-09-01 DIAGNOSIS — Z0489 Encounter for examination and observation for other specified reasons: Secondary | ICD-10-CM

## 2015-09-01 DIAGNOSIS — Z36 Encounter for antenatal screening of mother: Secondary | ICD-10-CM | POA: Diagnosis not present

## 2015-09-01 DIAGNOSIS — IMO0002 Reserved for concepts with insufficient information to code with codable children: Secondary | ICD-10-CM

## 2015-09-01 DIAGNOSIS — O288 Other abnormal findings on antenatal screening of mother: Secondary | ICD-10-CM | POA: Diagnosis not present

## 2015-09-01 NOTE — Progress Notes (Signed)
Genetic Counseling  High-Risk Gestation Note  Appointment Date:  09/01/2015 Referred By: Zelphia CairoAdkins, Gretchen, MD Date of Birth:  11-19-1980   Pregnancy History: G2P1001 Estimated Date of Delivery: 01/22/16 Estimated Gestational Age: 7260w4d Attending: Alpha GulaPaul Whitecar, MD     Mrs. Gilman SchmidtKimberly S Neidlinger was seen for consultation for genetic counseling because of an elevated MSAFP of 2.55 MoMs based on maternal serum screening through [facility].  Her mother was also present for today's visit.   In summary:  Discussed elevated MSAFP   Reviewed possible explanations for elevation  Discussed additional options  Ultrasound - performed today, limited views of spine and cord insert; follow-up scheduled for 09/30/15  Amniocentesis-declined  Discussed associations with unexplained elevated MSAFP  Reviewed family history concerns-Medium Chain Acyl CoA-Dehydrogenase deficiency (MCADD) for Mr. Abran DukeFranklin's first cousins once removed  Given autosomal recessive inheritance, general population carrier frequency and reported family history, recurrence risk for MCADD in pregnancy ~ 1 in 1920 (<0.1%)  Patient declined carrier screening at this time  Discussed carrier screening options (CF, SMA, hemoglobinopathies)- pt declined  We reviewed Mrs. Godfrey PickKimberly S Futrell's maternal serum screening result, the elevation of MSAFP, and the associated 1 in 216 risk for a fetal open neural tube defect.   We reviewed open neural tube defects including: the typical multifactorial etiology and variable prognosis.  In addition, we discussed alternative explanations for an elevated MSAFP including: normal variation, twins, feto-maternal bleeding, a gestational dating error, abdominal wall defects, kidney differences, oligohydramnios, and placental problems.  We discussed that an unexplained elevation of MSAFP is associated with an increased risk for third trimester complications including: prematurity, low birth  weight, and pre-eclampsia.    We reviewed additional available screening and diagnostic options including detailed ultrasound and amniocentesis.  We discussed the risks, limitations, and benefits of each.  After thoughtful consideration of these options, Mrs. Susann GivensFranklin elected to have ultrasound, but declined amniocentesis.  She understands that ultrasound cannot rule out all birth defects or genetic syndromes.  However, she was counseled that 80-90% of fetuses with open neural tube defects can be detected by detailed second trimester ultrasound, when well visualized.  A complete ultrasound was performed today.  The ultrasound report will be sent under separate cover.  Mrs. Gilman SchmidtKimberly S Powe was provided with written information regarding cystic fibrosis (CF), spinal muscular atrophy (SMA) and hemoglobinopathies including the carrier frequency, availability of carrier screening and prenatal diagnosis if indicated.  In addition, we discussed that CF and hemoglobinopathies are routinely screened for as part of the [facility] newborn screening panel.  After further discussion, she declined screening for CF, SMA and hemoglobinopathies.  Both family histories were reviewed and found to be contributory for MCAD deficiency in Mr. Abran DukeFranklin's cousin's children. The patient reported that Mr. Abran DukeFranklin's maternal aunt's daughter's two children have a condition that was diagnosed in the newborn period. Per the patient's OB records, her partner had reported this condition is MCADD. Medium Chain Acyl-Coenzyme A Dehydrogenase Deficiency (MCAD deficiency, MCADD) is an autosomal recessive genetic conditions. MCAD is an enzyme that typically assists in beta-oxidation of medium chain fatty acids (or the break down of fat stores), which is used as an energy source once glycogen stores are depleted. MCAD Deficiency is a disorder caused by mutations in the ACADM gene, which leads to reduced enzyme activity and thus, the inability to break  down medium chain fatty acids. This can lead to metabolic crises in times of fasting (when fatty acids would typically be broken down for energy) presenting  with hypoketotic hypoglycemia, lethargy, vomiting, and possible seizures. Management of the condition typically involves instituting frequent feedings to avoid long periods of fasting. We reviewed the autosomal recessive inheritance of the conditions. Given the report, Mr. Dutson would have a 1 in 8 chance to be a carrier for MCAD, and Mrs. Klostermann would have the general population chance of approximately 1 in 48. We reviewed that a carrier couple for an autosomal recessive condition has a 1 in 4 (25%) chance to have a child with the condition. Thus, given the available information, recurrence risk for MCADD in the current pregnancy would be approximately 1 in 1960 (<0.1%). Carrier screening is available for MCAD deficiency for Mrs. Arutyunyan or her husband. Prenatal diagnosis would be available via amniocentesis, if a couple were identified to be carriers. We also discussed that MCAD Deficiency is included on the newborn screening panel in [place]. Mrs. Schulte declined carrier screening for MCADD today. Additional information regarding the condition in the family may alter recurrence risk assessment.   Additionally, Mrs. Dwyer reported that her husband has a different maternal first cousin who had a son who died in late childhood. She reported that he was in a wheelchair, but they did not have additional information regarding his specific medical diagnoses. We discussed that accurate recurrence risk estimate cannot be assessed without additional information; however, the overall recurrence risk for the current pregnancy is likely low given the degree or relation and reported family history. Additional genetic counseling may be warranted if additional information is obtained.   Mrs. KERRI-ANNE HAEBERLE denied exposure to environmental toxins or  chemical agents. She denied the use of alcohol, tobacco or street drugs. She denied significant viral illnesses during the course of her pregnancy. Her medical and surgical histories were contributory for asthma and for preeclampsia in her previous pregnancy.   I counseled Mrs. ROSHUNDA KEIR for approximately 40 minutes regarding the above risks and available options.    Quinn Plowman, MS,  Certified Genetic Counselor 09/01/2015

## 2015-09-02 ENCOUNTER — Encounter (HOSPITAL_COMMUNITY): Payer: Self-pay

## 2015-09-02 ENCOUNTER — Other Ambulatory Visit (HOSPITAL_COMMUNITY): Payer: Self-pay

## 2015-09-22 ENCOUNTER — Ambulatory Visit (INDEPENDENT_AMBULATORY_CARE_PROVIDER_SITE_OTHER): Payer: 59 | Admitting: Family Medicine

## 2015-09-22 ENCOUNTER — Encounter (INDEPENDENT_AMBULATORY_CARE_PROVIDER_SITE_OTHER): Payer: Self-pay

## 2015-09-22 ENCOUNTER — Encounter: Payer: Self-pay | Admitting: Family Medicine

## 2015-09-22 DIAGNOSIS — J309 Allergic rhinitis, unspecified: Secondary | ICD-10-CM | POA: Insufficient documentation

## 2015-09-22 MED ORDER — MONTELUKAST SODIUM 10 MG PO TABS: 10.0000 mg | ORAL_TABLET | Freq: Every day | ORAL | 3 refills | Status: DC

## 2015-09-22 MED ORDER — ALBUTEROL SULFATE HFA 108 (90 BASE) MCG/ACT IN AERS: 2.0000 | INHALATION_SPRAY | Freq: Four times a day (QID) | RESPIRATORY_TRACT | 6 refills | Status: DC | PRN

## 2015-09-22 MED ORDER — FLUTICASONE PROPIONATE 50 MCG/ACT NA SUSP: 2.0000 | Freq: Every day | NASAL | 11 refills | Status: DC

## 2015-09-22 MED ORDER — CETIRIZINE HCL 10 MG PO TABS: 10.0000 mg | ORAL_TABLET | Freq: Every day | ORAL | 3 refills | Status: DC

## 2015-09-22 MED FILL — FLUTICASONE PROP 50 MCG SPR: 50 | 30 days supply | Qty: 16 | Fill #0

## 2015-09-22 MED FILL — MONTELUKAST SOD 10 MG TAB: 10 | 90 days supply | Qty: 90 | Fill #0

## 2015-09-22 MED FILL — VENTOLIN HFA 90 MCG INHALER: 108 (90 BAS | 25 days supply | Qty: 18 | Fill #0

## 2015-09-22 NOTE — Progress Notes (Signed)
Pre visit review using our clinic review tool, if applicable. No additional management support is needed unless otherwise documented below in the visit note. 

## 2015-09-22 NOTE — Progress Notes (Signed)
Subjective:  Patient ID: Sonia Martinez, female    DOB: Feb 08, 1980  Age: 35 y.o. MRN: [id number]  CC: Establish care, Medication refill  HPI Sonia Martinez is a 35 y.o. female G2P1001 @ 9110w4d presents to establish care. Issue is outlined below.  Allergic rhinitis  Patient Has long-standing history of allergies.  Well-controlled on Flonase, Singulair, Zyrtec.  In need of refill today.  PMH, Surgical Hx, Family Hx, Social History reviewed and updated as below.  Past Medical History:  Diagnosis Date  . Anxiety   . Asthma   . IBS (irritable bowel syndrome)   . Migraine without aura, without mention of intractable migraine without mention of status migrainosus 06/05/2013  . Obesity   . Oligohydramnios    2013  . Pre-eclampsia    2013   Past Surgical History:  Procedure Laterality Date  . SEPTOPLASTY    . WISDOM TOOTH EXTRACTION     Family History  Problem Relation Age of Onset  . Hypertension Mother   . Cancer - Other Mother     uterine  . Arthritis Mother   . Heart disease Maternal Grandfather   . Hyperlipidemia Maternal Grandfather   . Hypertension Maternal Grandfather   . Diabetes Paternal Grandmother   . Hyperlipidemia Paternal Grandmother   . Stroke Paternal Grandmother   . Hypertension Paternal Grandmother   . Lung cancer Paternal Grandfather   . Diabetes Paternal Grandfather   . Cancer - Other Paternal Grandfather     lung  . Hyperlipidemia Paternal Grandfather   . Hypertension Paternal Grandfather   . Hypertension Father   . Arthritis Maternal Grandmother   . Hyperlipidemia Maternal Grandmother   . Stroke Maternal Grandmother   . Hypertension Maternal Grandmother   . Anesthesia problems Neg Hx   . Malignant hyperthermia Neg Hx   . Pseudochol deficiency Neg Hx   . Hypotension Neg Hx   . Migraines Neg Hx    Social History  Substance Use Topics  . Smoking status: Never Smoker  . Smokeless tobacco: Never Used  . Alcohol use No   Review  of Systems  Respiratory: Positive for cough.   Gastrointestinal: Positive for constipation.  All other systems reviewed and are negative.  Objective:   Today's Vitals: BP 120/72 (BP Location: Right Arm, Patient Position: Sitting, Cuff Size: Large)   Pulse 92   Temp 98.2 F (36.8 C) (Oral)   Wt 266 lb 2 oz (120.7 kg)   LMP 04/17/2015   SpO2 99%   BMI 41.07 kg/m   Physical Exam  Constitutional: She is oriented to person, place, and time. She appears well-developed. No distress.  HENT:  Head: Normocephalic and atraumatic.  Mouth/Throat: Oropharynx is clear and moist.  Eyes: Conjunctivae are normal. No scleral icterus.  Neck: Neck supple.  Cardiovascular: Normal rate and regular rhythm.   2/6 systolic murmur.   Pulmonary/Chest: Effort normal. She has no wheezes. She has no rales.  Abdominal: Soft. She exhibits no distension. There is no tenderness.  Fundus palpated just above the umbilicus.   Musculoskeletal: Normal range of motion.  Bilateral lower extremity edema.  Lymphadenopathy:    She has no cervical adenopathy.  Neurological: She is alert and oriented to person, place, and time.  Skin: Skin is warm and dry. No rash noted.  Psychiatric: She has a normal mood and affect.  Vitals reviewed.  Assessment & Plan:   Problem List Items Addressed This Visit    Allergic rhinitis    Stable. Medications  safe in pregnancy. Zyrtec, Singulair, Flonase refill today.       Other Visit Diagnoses   None.    Outpatient Encounter Prescriptions as of 09/22/2015  Medication Sig  . albuterol (VENTOLIN HFA) 108 (90 Base) MCG/ACT inhaler Inhale 2 puffs into the lungs every 6 (six) hours as needed for wheezing or shortness of breath.  . cetirizine (ZYRTEC ALLERGY) 10 MG tablet Take 1 tablet (10 mg total) by mouth daily.  Tery Sanfilippo Calcium (STOOL SOFTENER PO) Take by mouth.  . fluticasone (FLONASE) 50 MCG/ACT nasal spray Place 2 sprays into both nostrils daily. 2 sprays in each nostril   . montelukast (SINGULAIR) 10 MG tablet Take 1 tablet (10 mg total) by mouth daily.  . Multiple Vitamin (MULTIVITAMIN) tablet Take 1 tablet by mouth daily.  Marland Kitchen pyridoxine (B-6) 100 MG tablet Take 100 mg by mouth daily.  . ranitidine (ZANTAC) 150 MG tablet Take 150 mg by mouth as needed.   . vitamin C (ASCORBIC ACID) 500 MG tablet Take 500 mg by mouth daily.  . [DISCONTINUED] albuterol (VENTOLIN HFA) 108 (90 BASE) MCG/ACT inhaler Frequency:PRN   Dosage:90   MCG  Instructions:  Note:Dose: 90 MCG  . [DISCONTINUED] Cetirizine HCl (ZYRTEC ALLERGY PO) Take by mouth.  . [DISCONTINUED] fluticasone (FLONASE) 50 MCG/ACT nasal spray Place 2 sprays into the nose daily. 2 sprays in each nostril  . [DISCONTINUED] L-Lysine 1000 MG TABS Take 1 tablet by mouth at bedtime.  . [DISCONTINUED] montelukast (SINGULAIR) 10 MG tablet Take 10 mg by mouth at bedtime.  . [DISCONTINUED] Omega-3 Fatty Acids (FISH OIL) 1200 MG CAPS Take 1,200 mg by mouth daily.  . [DISCONTINUED] phenazopyridine (PYRIDIUM) 100 MG tablet Take 1 tablet (100 mg total) by mouth 3 (three) times daily as needed for pain. (Patient not taking: Reported on 09/01/2015)   No facility-administered encounter medications on file as of 09/22/2015.     Follow-up: Annually  Everlene Other DO [facility]

## 2015-09-22 NOTE — Assessment & Plan Note (Signed)
Stable. Medications safe in pregnancy. Zyrtec, Singulair, Flonase refill today.

## 2015-09-22 NOTE — Patient Instructions (Signed)
Continue your current medications.  I have refilled them today.  Follow up annually or sooner if needed.  Take care  Dr. Lacinda Axon   Health Maintenance, Female Adopting a healthy lifestyle and getting preventive care can go a long way to promote health and wellness. Talk with your health care provider about what schedule of regular examinations is right for you. This is a good chance for you to check in with your provider about disease prevention and staying healthy. In between checkups, there are plenty of things you can do on your own. Experts have done a lot of research about which lifestyle changes and preventive measures are most likely to keep you healthy. Ask your health care provider for more information. WEIGHT AND DIET  Eat a healthy diet  Be sure to include plenty of vegetables, fruits, low-fat dairy products, and lean protein.  Do not eat a lot of foods high in solid fats, added sugars, or salt.  Get regular exercise. This is one of the most important things you can do for your health.  Most adults should exercise for at least 150 minutes each week. The exercise should increase your heart rate and make you sweat (moderate-intensity exercise).  Most adults should also do strengthening exercises at least twice a week. This is in addition to the moderate-intensity exercise.  Maintain a healthy weight  Body mass index (BMI) is a measurement that can be used to identify possible weight problems. It estimates body fat based on height and weight. Your health care provider can help determine your BMI and help you achieve or maintain a healthy weight.  For females 77 years of age and older:   A BMI below 18.5 is considered underweight.  A BMI of 18.5 to 24.9 is normal.  A BMI of 25 to 29.9 is considered overweight.  A BMI of 30 and above is considered obese.  Watch levels of cholesterol and blood lipids  You should start having your blood tested for lipids and cholesterol at  33 years of age, then have this test every 5 years.  You may need to have your cholesterol levels checked more often if:  Your lipid or cholesterol levels are high.  You are older than 33 years of age.  You are at high risk for heart disease.  CANCER SCREENING   Lung Cancer  Lung cancer screening is recommended for adults 57-29 years old who are at high risk for lung cancer because of a history of smoking.  A yearly low-dose CT scan of the lungs is recommended for people who:  Currently smoke.  Have quit within the past 15 years.  Have at least a 30-pack-year history of smoking. A pack year is smoking an average of one pack of cigarettes a day for 1 year.  Yearly screening should continue until it has been 15 years since you quit.  Yearly screening should stop if you develop a health problem that would prevent you from having lung cancer treatment.  Breast Cancer  Practice breast self-awareness. This means understanding how your breasts normally appear and feel.  It also means doing regular breast self-exams. Let your health care provider know about any changes, no matter how small.  If you are in your 20s or 30s, you should have a clinical breast exam (CBE) by a health care provider every 1-3 years as part of a regular health exam.  If you are 16 or older, have a CBE every year. Also consider having a breast  X-ray (mammogram) every year.  If you have a family history of breast cancer, talk to your health care provider about genetic screening.  If you are at high risk for breast cancer, talk to your health care provider about having an MRI and a mammogram every year.  Breast cancer gene (BRCA) assessment is recommended for women who have family members with BRCA-related cancers. BRCA-related cancers include:  Breast.  Ovarian.  Tubal.  Peritoneal cancers.  Results of the assessment will determine the need for genetic counseling and BRCA1 and BRCA2  testing. Cervical Cancer Your health care provider may recommend that you be screened regularly for cancer of the pelvic organs (ovaries, uterus, and vagina). This screening involves a pelvic examination, including checking for microscopic changes to the surface of your cervix (Pap test). You may be encouraged to have this screening done every 3 years, beginning at age 44.  For women ages 59-65, health care providers may recommend pelvic exams and Pap testing every 3 years, or they may recommend the Pap and pelvic exam, combined with testing for human papilloma virus (HPV), every 5 years. Some types of HPV increase your risk of cervical cancer. Testing for HPV may also be done on women of any age with unclear Pap test results.  Other health care providers may not recommend any screening for nonpregnant women who are considered low risk for pelvic cancer and who do not have symptoms. Ask your health care provider if a screening pelvic exam is right for you.  If you have had past treatment for cervical cancer or a condition that could lead to cancer, you need Pap tests and screening for cancer for at least 20 years after your treatment. If Pap tests have been discontinued, your risk factors (such as having a new sexual partner) need to be reassessed to determine if screening should resume. Some women have medical problems that increase the chance of getting cervical cancer. In these cases, your health care provider may recommend more frequent screening and Pap tests. Colorectal Cancer  This type of cancer can be detected and often prevented.  Routine colorectal cancer screening usually begins at 33 years of age and continues through 33 years of age.  Your health care provider may recommend screening at an earlier age if you have risk factors for colon cancer.  Your health care provider may also recommend using home test kits to check for hidden blood in the stool.  A small camera at the end of a  tube can be used to examine your colon directly (sigmoidoscopy or colonoscopy). This is done to check for the earliest forms of colorectal cancer.  Routine screening usually begins at age 39.  Direct examination of the colon should be repeated every 5-10 years through 33 years of age. However, you may need to be screened more often if early forms of precancerous polyps or small growths are found. Skin Cancer  Check your skin from head to toe regularly.  Tell your health care provider about any new moles or changes in moles, especially if there is a change in a mole's shape or color.  Also tell your health care provider if you have a mole that is larger than the size of a pencil eraser.  Always use sunscreen. Apply sunscreen liberally and repeatedly throughout the day.  Protect yourself by wearing long sleeves, pants, a wide-brimmed hat, and sunglasses whenever you are outside. HEART DISEASE, DIABETES, AND HIGH BLOOD PRESSURE   High blood pressure causes heart  disease and increases the risk of stroke. High blood pressure is more likely to develop in:  People who have blood pressure in the high end of the normal range (130-139/85-89 mm Hg).  People who are overweight or obese.  People who are African American.  If you are 71-41 years of age, have your blood pressure checked every 3-5 years. If you are 58 years of age or older, have your blood pressure checked every year. You should have your blood pressure measured twice--once when you are at a hospital or clinic, and once when you are not at a hospital or clinic. Record the average of the two measurements. To check your blood pressure when you are not at a hospital or clinic, you can use:  An automated blood pressure machine at a pharmacy.  A home blood pressure monitor.  If you are between 61 years and 78 years old, ask your health care provider if you should take aspirin to prevent strokes.  Have regular diabetes screenings. This  involves taking a blood sample to check your fasting blood sugar level.  If you are at a normal weight and have a low risk for diabetes, have this test once every three years after 33 years of age.  If you are overweight and have a high risk for diabetes, consider being tested at a younger age or more often. PREVENTING INFECTION  Hepatitis B  If you have a higher risk for hepatitis B, you should be screened for this virus. You are considered at high risk for hepatitis B if:  You were born in a country where hepatitis B is common. Ask your health care provider which countries are considered high risk.  Your parents were born in a high-risk country, and you have not been immunized against hepatitis B (hepatitis B vaccine).  You have HIV or AIDS.  You use needles to inject street drugs.  You live with someone who has hepatitis B.  You have had sex with someone who has hepatitis B.  You get hemodialysis treatment.  You take certain medicines for conditions, including cancer, organ transplantation, and autoimmune conditions. Hepatitis C  Blood testing is recommended for:  Everyone born from [birth year] through 1963.  Anyone with known risk factors for hepatitis C. Sexually transmitted infections (STIs)  You should be screened for sexually transmitted infections (STIs) including gonorrhea and chlamydia if:  You are sexually active and are younger than 33 years of age.  You are older than 33 years of age and your health care provider tells you that you are at risk for this type of infection.  Your sexual activity has changed since you were last screened and you are at an increased risk for chlamydia or gonorrhea. Ask your health care provider if you are at risk.  If you do not have HIV, but are at risk, it may be recommended that you take a prescription medicine daily to prevent HIV infection. This is called pre-exposure prophylaxis (PrEP). You are considered at risk if:  You are  sexually active and do not regularly use condoms or know the HIV status of your partner(s).  You take drugs by injection.  You are sexually active with a partner who has HIV. Talk with your health care provider about whether you are at high risk of being infected with HIV. If you choose to begin PrEP, you should first be tested for HIV. You should then be tested every 3 months for as long as you are taking  PrEP.  PREGNANCY   If you are premenopausal and you may become pregnant, ask your health care provider about preconception counseling.  If you may become pregnant, take 400 to 800 micrograms (mcg) of folic acid every day.  If you want to prevent pregnancy, talk to your health care provider about birth control (contraception). OSTEOPOROSIS AND MENOPAUSE   Osteoporosis is a disease in which the bones lose minerals and strength with aging. This can result in serious bone fractures. Your risk for osteoporosis can be identified using a bone density scan.  If you are 65 years of age or older, or if you are at risk for osteoporosis and fractures, ask your health care provider if you should be screened.  Ask your health care provider whether you should take a calcium or vitamin D supplement to lower your risk for osteoporosis.  Menopause may have certain physical symptoms and risks.  Hormone replacement therapy may reduce some of these symptoms and risks. Talk to your health care provider about whether hormone replacement therapy is right for you.  HOME CARE INSTRUCTIONS   Schedule regular health, dental, and eye exams.  Stay current with your immunizations.   Do not use any tobacco products including cigarettes, chewing tobacco, or electronic cigarettes.  If you are pregnant, do not drink alcohol.  If you are breastfeeding, limit how much and how often you drink alcohol.  Limit alcohol intake to no more than 1 drink per day for nonpregnant women. One drink equals 12 ounces of beer, 5  ounces of wine, or 1 ounces of hard liquor.  Do not use street drugs.  Do not share needles.  Ask your health care provider for help if you need support or information about quitting drugs.  Tell your health care provider if you often feel depressed.  Tell your health care provider if you have ever been abused or do not feel safe at home.   This information is not intended to replace advice given to you by your health care provider. Make sure you discuss any questions you have with your health care provider.   Document Released: 07/26/2010 Document Revised: 01/31/2014 Document Reviewed: 12/12/2012 Elsevier Interactive Patient Education [employer].

## 2015-09-29 MED FILL — ALL DAY ALLERGY 10 MG TAB: 10 | 100 days supply | Qty: 100 | Fill #0

## 2015-09-30 ENCOUNTER — Other Ambulatory Visit (HOSPITAL_COMMUNITY): Payer: Self-pay | Admitting: *Deleted

## 2015-09-30 ENCOUNTER — Other Ambulatory Visit (HOSPITAL_COMMUNITY): Payer: Self-pay | Admitting: Maternal and Fetal Medicine

## 2015-09-30 ENCOUNTER — Ambulatory Visit (HOSPITAL_COMMUNITY)
Admission: RE | Admit: 2015-09-30 | Discharge: 2015-09-30 | Disposition: A | Discharge status: Home / Self Care | Payer: 59 | Source: Ambulatory Visit | Attending: Obstetrics and Gynecology | Admitting: Obstetrics and Gynecology

## 2015-09-30 ENCOUNTER — Encounter (HOSPITAL_COMMUNITY): Payer: Self-pay

## 2015-09-30 DIAGNOSIS — Z0489 Encounter for examination and observation for other specified reasons: Secondary | ICD-10-CM

## 2015-09-30 DIAGNOSIS — Z3A23 23 weeks gestation of pregnancy: Secondary | ICD-10-CM | POA: Diagnosis not present

## 2015-09-30 DIAGNOSIS — O280 Abnormal hematological finding on antenatal screening of mother: Secondary | ICD-10-CM

## 2015-09-30 DIAGNOSIS — O288 Other abnormal findings on antenatal screening of mother: Secondary | ICD-10-CM | POA: Diagnosis not present

## 2015-09-30 DIAGNOSIS — IMO0002 Reserved for concepts with insufficient information to code with codable children: Secondary | ICD-10-CM

## 2015-09-30 DIAGNOSIS — O283 Abnormal ultrasonic finding on antenatal screening of mother: Secondary | ICD-10-CM | POA: Diagnosis not present

## 2015-09-30 DIAGNOSIS — O4442 Low lying placenta NOS or without hemorrhage, second trimester: Secondary | ICD-10-CM

## 2015-09-30 DIAGNOSIS — O99212 Obesity complicating pregnancy, second trimester: Secondary | ICD-10-CM | POA: Diagnosis not present

## 2015-09-30 DIAGNOSIS — Z36 Encounter for antenatal screening of mother: Secondary | ICD-10-CM | POA: Diagnosis not present

## 2015-09-30 DIAGNOSIS — E669 Obesity, unspecified: Secondary | ICD-10-CM | POA: Diagnosis not present

## 2015-09-30 NOTE — Addendum Note (Signed)
Encounter addended by: Vanetta Shawlarolyn H Aliviyah Malanga, RT, RVT, RDMS on: 09/30/2015  3:31 PM<BR>    Actions taken: Imaging Exam ended

## 2015-10-05 DIAGNOSIS — G5601 Carpal tunnel syndrome, right upper limb: Secondary | ICD-10-CM | POA: Diagnosis not present

## 2015-10-23 DIAGNOSIS — Z23 Encounter for immunization: Secondary | ICD-10-CM | POA: Diagnosis not present

## 2015-10-23 DIAGNOSIS — Z3400 Encounter for supervision of normal first pregnancy, unspecified trimester: Secondary | ICD-10-CM | POA: Diagnosis not present

## 2015-10-23 DIAGNOSIS — Z36 Encounter for antenatal screening of mother: Secondary | ICD-10-CM | POA: Diagnosis not present

## 2015-10-23 DIAGNOSIS — D509 Iron deficiency anemia, unspecified: Secondary | ICD-10-CM | POA: Diagnosis not present

## 2015-10-28 ENCOUNTER — Ambulatory Visit (HOSPITAL_COMMUNITY): Payer: 59

## 2015-11-10 MED FILL — valACYclovir HCL 1 GM TABS: 1 | 2 days supply | Qty: 8 | Fill #0

## 2015-11-11 ENCOUNTER — Ambulatory Visit (HOSPITAL_COMMUNITY): Payer: 59

## 2015-11-11 DIAGNOSIS — Z3400 Encounter for supervision of normal first pregnancy, unspecified trimester: Secondary | ICD-10-CM | POA: Diagnosis not present

## 2015-11-11 DIAGNOSIS — O4692 Antepartum hemorrhage, unspecified, second trimester: Secondary | ICD-10-CM | POA: Diagnosis not present

## 2015-11-11 DIAGNOSIS — Z3A29 29 weeks gestation of pregnancy: Secondary | ICD-10-CM | POA: Diagnosis not present

## 2015-11-17 ENCOUNTER — Encounter (HOSPITAL_COMMUNITY): Payer: Self-pay

## 2015-11-17 ENCOUNTER — Ambulatory Visit (HOSPITAL_COMMUNITY)
Admission: RE | Admit: 2015-11-17 | Discharge: 2015-11-17 | Disposition: A | Discharge status: Home / Self Care | Payer: 59 | Source: Ambulatory Visit | Attending: Obstetrics and Gynecology | Admitting: Obstetrics and Gynecology

## 2015-11-17 ENCOUNTER — Other Ambulatory Visit (HOSPITAL_COMMUNITY): Payer: Self-pay | Admitting: Obstetrics and Gynecology

## 2015-11-17 DIAGNOSIS — O283 Abnormal ultrasonic finding on antenatal screening of mother: Secondary | ICD-10-CM | POA: Diagnosis not present

## 2015-11-17 DIAGNOSIS — E669 Obesity, unspecified: Secondary | ICD-10-CM | POA: Insufficient documentation

## 2015-11-17 DIAGNOSIS — O280 Abnormal hematological finding on antenatal screening of mother: Secondary | ICD-10-CM

## 2015-11-17 DIAGNOSIS — O4440 Low lying placenta NOS or without hemorrhage, unspecified trimester: Secondary | ICD-10-CM

## 2015-11-17 DIAGNOSIS — O99213 Obesity complicating pregnancy, third trimester: Secondary | ICD-10-CM | POA: Diagnosis not present

## 2015-11-17 DIAGNOSIS — Z362 Encounter for other antenatal screening follow-up: Secondary | ICD-10-CM | POA: Insufficient documentation

## 2015-11-17 DIAGNOSIS — Z3689 Encounter for other specified antenatal screening: Secondary | ICD-10-CM | POA: Diagnosis not present

## 2015-11-17 DIAGNOSIS — O289 Unspecified abnormal findings on antenatal screening of mother: Secondary | ICD-10-CM | POA: Diagnosis not present

## 2015-11-17 DIAGNOSIS — Z3A30 30 weeks gestation of pregnancy: Secondary | ICD-10-CM | POA: Insufficient documentation

## 2015-11-17 DIAGNOSIS — O4443 Low lying placenta NOS or without hemorrhage, third trimester: Secondary | ICD-10-CM | POA: Diagnosis not present

## 2015-11-25 MED FILL — ADVAIR 100/50 DISKUS: 100-50 | 90 days supply | Qty: 180 | Fill #1

## 2015-12-04 DIAGNOSIS — O26893 Other specified pregnancy related conditions, third trimester: Secondary | ICD-10-CM | POA: Diagnosis not present

## 2015-12-04 DIAGNOSIS — Z3A34 34 weeks gestation of pregnancy: Secondary | ICD-10-CM | POA: Diagnosis not present

## 2015-12-04 DIAGNOSIS — Z3493 Encounter for supervision of normal pregnancy, unspecified, third trimester: Secondary | ICD-10-CM | POA: Diagnosis not present

## 2015-12-14 DIAGNOSIS — O26893 Other specified pregnancy related conditions, third trimester: Secondary | ICD-10-CM | POA: Diagnosis not present

## 2015-12-14 DIAGNOSIS — Z3685 Encounter for antenatal screening for Streptococcus B: Secondary | ICD-10-CM | POA: Diagnosis not present

## 2015-12-14 DIAGNOSIS — Z3493 Encounter for supervision of normal pregnancy, unspecified, third trimester: Secondary | ICD-10-CM | POA: Diagnosis not present

## 2015-12-14 DIAGNOSIS — Z3A35 35 weeks gestation of pregnancy: Secondary | ICD-10-CM | POA: Diagnosis not present

## 2015-12-21 MED FILL — MONTELUKAST SOD 10 MG TAB: 10 | 90 days supply | Qty: 90 | Fill #1

## 2015-12-21 MED FILL — FLUTICASONE PROP 50 MCG SPR: 50 | 30 days supply | Qty: 16 | Fill #1

## 2015-12-23 ENCOUNTER — Ambulatory Visit (HOSPITAL_COMMUNITY)
Admission: RE | Admit: 2015-12-23 | Discharge: 2015-12-23 | Disposition: A | Discharge status: Home / Self Care | Payer: 59 | Source: Ambulatory Visit | Attending: Obstetrics and Gynecology | Admitting: Obstetrics and Gynecology

## 2015-12-23 ENCOUNTER — Encounter (HOSPITAL_COMMUNITY): Payer: Self-pay

## 2015-12-23 DIAGNOSIS — Z6841 Body Mass Index (BMI) 40.0 and over, adult: Secondary | ICD-10-CM | POA: Diagnosis not present

## 2015-12-23 DIAGNOSIS — E669 Obesity, unspecified: Secondary | ICD-10-CM | POA: Insufficient documentation

## 2015-12-23 DIAGNOSIS — O99213 Obesity complicating pregnancy, third trimester: Secondary | ICD-10-CM | POA: Insufficient documentation

## 2015-12-23 DIAGNOSIS — O280 Abnormal hematological finding on antenatal screening of mother: Secondary | ICD-10-CM | POA: Diagnosis not present

## 2015-12-23 DIAGNOSIS — Z3A35 35 weeks gestation of pregnancy: Secondary | ICD-10-CM | POA: Insufficient documentation

## 2015-12-30 ENCOUNTER — Telehealth (HOSPITAL_COMMUNITY): Payer: Self-pay | Admitting: *Deleted

## 2015-12-31 ENCOUNTER — Encounter (HOSPITAL_COMMUNITY): Payer: Self-pay

## 2015-12-31 ENCOUNTER — Inpatient Hospital Stay (HOSPITAL_COMMUNITY)
Admission: AD | Admit: 2015-12-31 | Discharge: 2016-01-01 | Disposition: A | Discharge status: Home / Self Care | Payer: 59 | Source: Ambulatory Visit | Attending: Obstetrics and Gynecology | Admitting: Obstetrics and Gynecology

## 2015-12-31 DIAGNOSIS — Z3490 Encounter for supervision of normal pregnancy, unspecified, unspecified trimester: Secondary | ICD-10-CM | POA: Insufficient documentation

## 2015-12-31 DIAGNOSIS — Z3A00 Weeks of gestation of pregnancy not specified: Secondary | ICD-10-CM | POA: Insufficient documentation

## 2015-12-31 NOTE — MAU Note (Signed)
Patient presents with ctxs 5 mins apart. Patient denies any bleeding or LOF. Fetus active.

## 2016-01-01 DIAGNOSIS — Z3A00 Weeks of gestation of pregnancy not specified: Secondary | ICD-10-CM | POA: Diagnosis not present

## 2016-01-01 DIAGNOSIS — Z3490 Encounter for supervision of normal pregnancy, unspecified, unspecified trimester: Secondary | ICD-10-CM | POA: Diagnosis not present

## 2016-01-01 NOTE — MAU Note (Signed)
Notified provider that patient is here for a labor eval. Patient is 3/50/-3 and was unchanged after an hour. Provider said to discharge patient with labor precautions.

## 2016-01-01 NOTE — Discharge Instructions (Signed)
Introduction Patient Name: ________________________________________________ Patient Due Date: ____________________ What is a fetal movement count? A fetal movement count is the number of times that you feel your baby move during a certain amount of time. This may also be called a fetal kick count. A fetal movement count is recommended for every pregnant woman. You may be asked to start counting fetal movements as early as week 28 of your pregnancy. Pay attention to when your baby is most active. You may notice your baby's sleep and wake cycles. You may also notice things that make your baby move more. You should do a fetal movement count:  When your baby is normally most active.  At the same time each day. A good time to count movements is while you are resting, after having something to eat and drink. How do I count fetal movements? 1. Find a quiet, comfortable area. Sit, or lie down on your side. 2. Write down the date, the start time and stop time, and the number of movements that you felt between those two times. Take this information with you to your health care visits. 3. For 2 hours, count kicks, flutters, swishes, rolls, and jabs. You should feel at least 10 movements during 2 hours. 4. You may stop counting after you have felt 10 movements. 5. If you do not feel 10 movements in 2 hours, have something to eat and drink. Then, keep resting and counting for 1 hour. If you feel at least 4 movements during that hour, you may stop counting. Contact a health care provider if:  You feel fewer than 4 movements in 2 hours.  Your baby is not moving like he or she usually does. Date: ____________ Start time: ____________ Stop time: ____________ Movements: ____________ Date: ____________ Start time: ____________ Stop time: ____________ Movements: ____________ Date: ____________ Start time: ____________ Stop time: ____________ Movements: ____________ Date: ____________ Start time: ____________  Stop time: ____________ Movements: ____________ Date: ____________ Start time: ____________ Stop time: ____________ Movements: ____________ Date: ____________ Start time: ____________ Stop time: ____________ Movements: ____________ Date: ____________ Start time: ____________ Stop time: ____________ Movements: ____________ Date: ____________ Start time: ____________ Stop time: ____________ Movements: ____________ Date: ____________ Start time: ____________ Stop time: ____________ Movements: ____________ This information is not intended to replace advice given to you by your health care provider. Make sure you discuss any questions you have with your health care provider. Document Released: 02/09/2006 Document Revised: 09/09/2015 Document Reviewed: 02/19/2015 Elsevier Interactive Patient Education  2017 [employer] Braxton Hicks Contractions Contractions of the uterus can occur throughout pregnancy. Contractions are not always a sign that you are in labor.  WHAT ARE BRAXTON HICKS CONTRACTIONS?  Contractions that occur before labor are called Braxton Hicks contractions, or false labor. Toward the end of pregnancy (32-34 weeks), these contractions can develop more often and may become more forceful. This is not true labor because these contractions do not result in opening (dilatation) and thinning of the cervix. They are sometimes difficult to tell apart from true labor because these contractions can be forceful and people have different pain tolerances. You should not feel embarrassed if you go to the hospital with false labor. Sometimes, the only way to tell if you are in true labor is for your health care provider to look for changes in the cervix. If there are no prenatal problems or other health problems associated with the pregnancy, it is completely safe to be sent home with false labor and await the onset of true labor.   HOW CAN YOU TELL THE DIFFERENCE BETWEEN TRUE AND FALSE LABOR? False Labor     The contractions of false labor are usually shorter and not as hard as those of true labor.   The contractions are usually irregular.   The contractions are often felt in the front of the lower abdomen and in the groin.   The contractions may go away when you walk around or change positions while lying down.   The contractions get weaker and are shorter lasting as time goes on.   The contractions do not usually become progressively stronger, regular, and closer together as with true labor.  True Labor   Contractions in true labor last 30-70 seconds, become very regular, usually become more intense, and increase in frequency.   The contractions do not go away with walking.   The discomfort is usually felt in the top of the uterus and spreads to the lower abdomen and low back.   True labor can be determined by your health care provider with an exam. This will show that the cervix is dilating and getting thinner.  WHAT TO REMEMBER  Keep up with your usual exercises and follow other instructions given by your health care provider.   Take medicines as directed by your health care provider.   Keep your regular prenatal appointments.   Eat and drink lightly if you think you are going into labor.   If Braxton Hicks contractions are making you uncomfortable:   Change your position from lying down or resting to walking, or from walking to resting.   Sit and rest in a tub of warm water.   Drink 2-3 glasses of water. Dehydration may cause these contractions.   Do slow and deep breathing several times an hour.  WHEN SHOULD I SEEK IMMEDIATE MEDICAL CARE? Seek immediate medical care if:  Your contractions become stronger, more regular, and closer together.   You have fluid leaking or gushing from your vagina.   You have a fever.   You pass blood-tinged mucus.   You have vaginal bleeding.   You have continuous abdominal pain.   You have low back pain  that you never had before.   You feel your baby's head pushing down and causing pelvic pressure.   Your baby is not moving as much as it used to.  This information is not intended to replace advice given to you by your health care provider. Make sure you discuss any questions you have with your health care provider. Document Released: 01/10/2005 Document Revised: 05/04/2015 Document Reviewed: 10/22/2012 Elsevier Interactive Patient Education  2017 Elsevier Inc.  

## 2016-01-04 DIAGNOSIS — O139 Gestational [pregnancy-induced] hypertension without significant proteinuria, unspecified trimester: Secondary | ICD-10-CM | POA: Diagnosis not present

## 2016-01-05 ENCOUNTER — Encounter (HOSPITAL_COMMUNITY): Payer: Self-pay | Admitting: *Deleted

## 2016-01-05 ENCOUNTER — Inpatient Hospital Stay (HOSPITAL_COMMUNITY)
Admission: AD | Admit: 2016-01-05 | Discharge: 2016-01-08 | DRG: 775 | Disposition: A | Discharge status: Home / Self Care | Payer: 59 | Source: Ambulatory Visit | Attending: Obstetrics and Gynecology | Admitting: Obstetrics and Gynecology

## 2016-01-05 ENCOUNTER — Inpatient Hospital Stay (HOSPITAL_COMMUNITY): Payer: 59 | Admitting: Anesthesiology

## 2016-01-05 ENCOUNTER — Inpatient Hospital Stay (HOSPITAL_COMMUNITY): Admission: RE | Admit: 2016-01-05 | Payer: 59 | Source: Ambulatory Visit

## 2016-01-05 DIAGNOSIS — Z833 Family history of diabetes mellitus: Secondary | ICD-10-CM

## 2016-01-05 DIAGNOSIS — Z823 Family history of stroke: Secondary | ICD-10-CM | POA: Diagnosis not present

## 2016-01-05 DIAGNOSIS — O1494 Unspecified pre-eclampsia, complicating childbirth: Secondary | ICD-10-CM | POA: Diagnosis not present

## 2016-01-05 DIAGNOSIS — O99214 Obesity complicating childbirth: Secondary | ICD-10-CM | POA: Diagnosis present

## 2016-01-05 DIAGNOSIS — O1493 Unspecified pre-eclampsia, third trimester: Secondary | ICD-10-CM | POA: Diagnosis not present

## 2016-01-05 DIAGNOSIS — O1490 Unspecified pre-eclampsia, unspecified trimester: Secondary | ICD-10-CM

## 2016-01-05 DIAGNOSIS — Z8249 Family history of ischemic heart disease and other diseases of the circulatory system: Secondary | ICD-10-CM

## 2016-01-05 DIAGNOSIS — Z6841 Body Mass Index (BMI) 40.0 and over, adult: Secondary | ICD-10-CM

## 2016-01-05 DIAGNOSIS — Z3A37 37 weeks gestation of pregnancy: Secondary | ICD-10-CM | POA: Diagnosis not present

## 2016-01-05 DIAGNOSIS — O9962 Diseases of the digestive system complicating childbirth: Secondary | ICD-10-CM | POA: Diagnosis present

## 2016-01-05 DIAGNOSIS — O280 Abnormal hematological finding on antenatal screening of mother: Secondary | ICD-10-CM

## 2016-01-05 DIAGNOSIS — O134 Gestational [pregnancy-induced] hypertension without significant proteinuria, complicating childbirth: Secondary | ICD-10-CM | POA: Diagnosis not present

## 2016-01-05 DIAGNOSIS — O9952 Diseases of the respiratory system complicating childbirth: Secondary | ICD-10-CM | POA: Diagnosis present

## 2016-01-05 HISTORY — DX: Unspecified pre-eclampsia, unspecified trimester: O14.90

## 2016-01-05 LAB — COMPREHENSIVE METABOLIC PANEL
ALBUMIN: 2.6 g/dL — AB (ref 3.5–5.0)
ALT: 155 U/L — ABNORMAL HIGH (ref 14–54)
ANION GAP: 8 (ref 5–15)
AST: 124 U/L — ABNORMAL HIGH (ref 15–41)
Alkaline Phosphatase: 211 U/L — ABNORMAL HIGH (ref 38–126)
BILIRUBIN TOTAL: 0.6 mg/dL (ref 0.3–1.2)
BUN: 6 mg/dL (ref 6–20)
CO2: 20 mmol/L — ABNORMAL LOW (ref 22–32)
Calcium: 8.3 mg/dL — ABNORMAL LOW (ref 8.9–10.3)
Chloride: 105 mmol/L (ref 101–111)
Creatinine, Ser: 0.71 mg/dL (ref 0.44–1.00)
Glucose, Bld: 119 mg/dL — ABNORMAL HIGH (ref 65–99)
POTASSIUM: 3.5 mmol/L (ref 3.5–5.1)
Sodium: 133 mmol/L — ABNORMAL LOW (ref 135–145)
TOTAL PROTEIN: 6.2 g/dL — AB (ref 6.5–8.1)

## 2016-01-05 LAB — CBC
HCT: 36.4 % (ref 36.0–46.0)
HEMATOCRIT: 35.2 % — AB (ref 36.0–46.0)
Hemoglobin: 12.2 g/dL (ref 12.0–15.0)
Hemoglobin: 12.3 g/dL (ref 12.0–15.0)
MCH: 31.5 pg (ref 26.0–34.0)
MCH: 30.7 pg (ref 26.0–34.0)
MCHC: 34.7 g/dL (ref 30.0–36.0)
MCHC: 33.8 g/dL (ref 30.0–36.0)
MCV: 91 fL (ref 78.0–100.0)
MCV: 90.8 fL (ref 78.0–100.0)
PLATELETS: 195 10*3/uL (ref 150–400)
Platelets: 193 10*3/uL (ref 150–400)
RBC: 3.87 MIL/uL (ref 3.87–5.11)
RBC: 4.01 MIL/uL (ref 3.87–5.11)
RDW: 13.6 % (ref 11.5–15.5)
RDW: 13.5 % (ref 11.5–15.5)
WBC: 7.1 10*3/uL (ref 4.0–10.5)
WBC: 9.7 10*3/uL (ref 4.0–10.5)

## 2016-01-05 LAB — PROTEIN / CREATININE RATIO, URINE
CREATININE, URINE: 203 mg/dL
PROTEIN CREATININE RATIO: 0.15 mg/mg{creat} (ref 0.00–0.15)
Total Protein, Urine: 31 mg/dL

## 2016-01-05 LAB — TYPE AND SCREEN
ABO/RH(D): A POS
ANTIBODY SCREEN: NEGATIVE

## 2016-01-05 LAB — ABO/RH: ABO/RH(D): A POS

## 2016-01-05 LAB — MRSA PCR SCREENING: MRSA by PCR: NEGATIVE

## 2016-01-05 LAB — OB RESULTS CONSOLE GBS: STREP GROUP B AG: POSITIVE

## 2016-01-05 MED ORDER — OXYCODONE-ACETAMINOPHEN 5-325 MG PO TABS: 2.0000 | ORAL_TABLET | ORAL | Status: DC | PRN

## 2016-01-05 MED ORDER — TERBUTALINE SULFATE 1 MG/ML IJ SOLN: 0.2500 mg | Freq: Once | INTRAMUSCULAR | Status: DC | PRN

## 2016-01-05 MED ORDER — LACTATED RINGERS IV SOLN
INTRAVENOUS | Status: DC
  Administered 2016-01-05 – 2016-01-06 (×2): via INTRAVENOUS

## 2016-01-05 MED ORDER — FLUTICASONE-SALMETEROL 100-50 MCG/DOSE IN AEPB: 1.0000 | INHALATION_SPRAY | Freq: Two times a day (BID) | RESPIRATORY_TRACT | Status: DC

## 2016-01-05 MED ORDER — EPHEDRINE 5 MG/ML INJ
10.0000 mg | INTRAVENOUS | Status: DC | PRN
Start: 2016-01-05 — End: 2016-01-06

## 2016-01-05 MED ORDER — LIDOCAINE HCL (PF) 1 % IJ SOLN
INTRAMUSCULAR | Status: DC | PRN
  Administered 2016-01-05: 7 mL via EPIDURAL
  Administered 2016-01-05: 6 mL via EPIDURAL

## 2016-01-05 MED ORDER — PENICILLIN G POT IN DEXTROSE 60000 UNIT/ML IV SOLN
3.0000 10*6.[IU] | INTRAVENOUS | Status: DC
  Administered 2016-01-05 – 2016-01-06 (×2): 3 10*6.[IU] via INTRAVENOUS
  Filled 2016-01-05 (×5): qty 50

## 2016-01-05 MED ORDER — ALBUTEROL SULFATE (2.5 MG/3ML) 0.083% IN NEBU: 3.0000 mL | INHALATION_SOLUTION | Freq: Four times a day (QID) | RESPIRATORY_TRACT | Status: DC

## 2016-01-05 MED ORDER — FENTANYL 2.5 MCG/ML BUPIVACAINE 1/10 % EPIDURAL INFUSION (WH - ANES)
14.0000 mL/h | INTRAMUSCULAR | Status: DC | PRN
  Administered 2016-01-05 (×2): 14 mL/h via EPIDURAL
  Filled 2016-01-05 (×2): qty 100

## 2016-01-05 MED ORDER — EPHEDRINE 5 MG/ML INJ: 10.0000 mg | INTRAVENOUS | Status: DC | PRN

## 2016-01-05 MED ORDER — OXYTOCIN 40 UNITS IN LACTATED RINGERS INFUSION - SIMPLE MED
2.5000 [IU]/h | INTRAVENOUS | Status: DC
  Administered 2016-01-06: 2.5 [IU]/h via INTRAVENOUS

## 2016-01-05 MED ORDER — LORATADINE 10 MG PO TABS
10.0000 mg | ORAL_TABLET | Freq: Every day | ORAL | Status: DC
  Administered 2016-01-06 – 2016-01-08 (×3): 10 mg via ORAL
  Filled 2016-01-05 (×3): qty 1

## 2016-01-05 MED ORDER — OXYTOCIN BOLUS FROM INFUSION
500.0000 mL | Freq: Once | INTRAVENOUS | Status: AC
  Administered 2016-01-06: 500 mL via INTRAVENOUS

## 2016-01-05 MED ORDER — LACTATED RINGERS IV SOLN: 500.0000 mL | Freq: Once | INTRAVENOUS | Status: DC

## 2016-01-05 MED ORDER — MOMETASONE FURO-FORMOTEROL FUM 100-5 MCG/ACT IN AERO: 2.0000 | INHALATION_SPRAY | Freq: Two times a day (BID) | RESPIRATORY_TRACT | Status: DC

## 2016-01-05 MED ORDER — OXYCODONE-ACETAMINOPHEN 5-325 MG PO TABS: 1.0000 | ORAL_TABLET | ORAL | Status: DC | PRN

## 2016-01-05 MED ORDER — OXYTOCIN 40 UNITS IN LACTATED RINGERS INFUSION - SIMPLE MED
1.0000 m[IU]/min | INTRAVENOUS | Status: DC
  Administered 2016-01-05: 2 m[IU]/min via INTRAVENOUS
  Filled 2016-01-05: qty 1000

## 2016-01-05 MED ORDER — MAGNESIUM SULFATE 50 % IJ SOLN
2.0000 g/h | Freq: Once | INTRAVENOUS | Status: AC
  Administered 2016-01-05: 2 g/h via INTRAVENOUS
  Filled 2016-01-05: qty 80

## 2016-01-05 MED ORDER — PENICILLIN G POTASSIUM 5000000 UNITS IJ SOLR
5.0000 10*6.[IU] | Freq: Once | INTRAVENOUS | Status: AC
  Administered 2016-01-05: 5 10*6.[IU] via INTRAVENOUS
  Filled 2016-01-05: qty 5

## 2016-01-05 MED ORDER — ZOLPIDEM TARTRATE 5 MG PO TABS: 5.0000 mg | ORAL_TABLET | Freq: Every evening | ORAL | Status: DC | PRN

## 2016-01-05 MED ORDER — PHENYLEPHRINE 40 MCG/ML (10ML) SYRINGE FOR IV PUSH (FOR BLOOD PRESSURE SUPPORT)
80.0000 ug | PREFILLED_SYRINGE | INTRAVENOUS | Status: DC | PRN
Start: 2016-01-05 — End: 2016-01-06

## 2016-01-05 MED ORDER — DIPHENHYDRAMINE HCL 50 MG/ML IJ SOLN: 12.5000 mg | INTRAMUSCULAR | Status: DC | PRN

## 2016-01-05 MED ORDER — MONTELUKAST SODIUM 10 MG PO TABS
10.0000 mg | ORAL_TABLET | Freq: Every day | ORAL | Status: DC
  Administered 2016-01-06 – 2016-01-07 (×2): 10 mg via ORAL
  Filled 2016-01-05 (×5): qty 1

## 2016-01-05 MED ORDER — PHENYLEPHRINE 40 MCG/ML (10ML) SYRINGE FOR IV PUSH (FOR BLOOD PRESSURE SUPPORT)
80.0000 ug | PREFILLED_SYRINGE | INTRAVENOUS | Status: DC | PRN
  Administered 2016-01-05 (×2): 80 ug via INTRAVENOUS
  Filled 2016-01-05: qty 10

## 2016-01-05 MED ORDER — LACTATED RINGERS IV SOLN: 500.0000 mL | INTRAVENOUS | Status: DC | PRN

## 2016-01-05 MED ORDER — SOD CITRATE-CITRIC ACID 500-334 MG/5ML PO SOLN
30.0000 mL | ORAL | Status: DC | PRN
  Administered 2016-01-05: 30 mL via ORAL
  Filled 2016-01-05: qty 15

## 2016-01-05 MED ORDER — ONDANSETRON HCL 4 MG/2ML IJ SOLN
4.0000 mg | Freq: Four times a day (QID) | INTRAMUSCULAR | Status: DC | PRN
  Administered 2016-01-06: 4 mg via INTRAVENOUS
  Filled 2016-01-05: qty 2

## 2016-01-05 MED ORDER — LIDOCAINE HCL (PF) 1 % IJ SOLN
30.0000 mL | INTRAMUSCULAR | Status: DC | PRN
  Filled 2016-01-05: qty 30

## 2016-01-05 MED ORDER — ACETAMINOPHEN 325 MG PO TABS: 650.0000 mg | ORAL_TABLET | ORAL | Status: DC | PRN

## 2016-01-05 MED FILL — ALL DAY ALLERGY 10 MG TAB: 10 | 100 days supply | Qty: 100 | Fill #1

## 2016-01-05 MED FILL — VENTOLIN HFA 90 MCG INHALER: 108 (90 BAS | 25 days supply | Qty: 18 | Fill #1

## 2016-01-05 NOTE — Anesthesia Preprocedure Evaluation (Signed)
Anesthesia Evaluation  Patient identified by MRN, date of birth, ID band Patient awake    Reviewed: Allergy & Precautions, H&P , NPO status , Patient's Chart, lab work & pertinent test results  Airway Mallampati: II  TM Distance: >3 FB Neck ROM: full    Dental no notable dental hx.    Pulmonary    Pulmonary exam normal        Cardiovascular hypertension, Pt. on medications Normal cardiovascular exam     Neuro/Psych negative psych ROS   GI/Hepatic negative GI ROS, Neg liver ROS,   Endo/Other  Morbid obesity  Renal/GU negative Renal ROS     Musculoskeletal   Abdominal (+) + obese,   Peds  Hematology negative hematology ROS (+)   Anesthesia Other Findings   Reproductive/Obstetrics (+) Pregnancy                             Anesthesia Physical Anesthesia Plan  ASA: III  Anesthesia Plan: Epidural   Post-op Pain Management:    Induction:   Airway Management Planned:   Additional Equipment:   Intra-op Plan:   Post-operative Plan:   Informed Consent: I have reviewed the patients History and Physical, chart, labs and discussed the procedure including the risks, benefits and alternatives for the proposed anesthesia with the patient or authorized representative who has indicated his/her understanding and acceptance.     Plan Discussed with:   Anesthesia Plan Comments:         Anesthesia Quick Evaluation

## 2016-01-05 NOTE — Progress Notes (Signed)
Patient now comfortable after epidural.  BP 98/79   Pulse 83   Temp 98.5 F (36.9 C) (Oral)   Resp 18   Ht 5' 7.5" (1.715 m)   Wt 268 lb (121.6 kg)   LMP 04/17/2015   SpO2 98%   BMI 41.36 kg/m  Results for orders placed or performed during the hospital encounter of 01/05/16 (from the past 24 hour(s))  OB RESULT CONSOLE Group B Strep     Status: None   Collection Time: 01/05/16 12:00 AM  Result Value Ref Range   GBS Positive   Protein / creatinine ratio, urine     Status: None   Collection Time: 01/05/16  1:55 PM  Result Value Ref Range   Creatinine, Urine 203.00 mg/dL   Total Protein, Urine 31 mg/dL   Protein Creatinine Ratio 0.15 0.00 - 0.15 mg/mg[Cre]  CBC     Status: Abnormal   Collection Time: 01/05/16  2:04 PM  Result Value Ref Range   WBC 7.1 4.0 - 10.5 K/uL   RBC 3.87 3.87 - 5.11 MIL/uL   Hemoglobin 12.2 12.0 - 15.0 g/dL   HCT 16.135.2 (L) 09.636.0 - 04.546.0 %   MCV 91.0 78.0 - 100.0 fL   MCH 31.5 26.0 - 34.0 pg   MCHC 34.7 30.0 - 36.0 g/dL   RDW 40.913.6 81.111.5 - 91.415.5 %   Platelets 193 150 - 400 K/uL  Comprehensive metabolic panel     Status: Abnormal   Collection Time: 01/05/16  2:04 PM  Result Value Ref Range   Sodium 133 (L) 135 - 145 mmol/L   Potassium 3.5 3.5 - 5.1 mmol/L   Chloride 105 101 - 111 mmol/L   CO2 20 (L) 22 - 32 mmol/L   Glucose, Bld 119 (H) 65 - 99 mg/dL   BUN 6 6 - 20 mg/dL   Creatinine, Ser 7.820.71 0.44 - 1.00 mg/dL   Calcium 8.3 (L) 8.9 - 10.3 mg/dL   Total Protein 6.2 (L) 6.5 - 8.1 g/dL   Albumin 2.6 (L) 3.5 - 5.0 g/dL   AST 956124 (H) 15 - 41 U/L   ALT 155 (H) 14 - 54 U/L   Alkaline Phosphatase 211 (H) 38 - 126 U/L   Total Bilirubin 0.6 0.3 - 1.2 mg/dL   GFR calc non Af Amer >60 >60 mL/min   GFR calc Af Amer >60 >60 mL/min   Anion gap 8 5 - 15  [facility]     Status: None   Collection Time: 01/05/16  3:58 PM  Result Value Ref Range   ABO/RH(D) A POS    Antibody Screen NEG    Sample Expiration 01/08/2016   ABO/Rh      Status: None   Collection Time: 01/05/16  3:58 PM  Result Value Ref Range   ABO/RH(D) A POS   MRSA PCR Screening     Status: None   Collection Time: 01/05/16  5:45 PM  Result Value Ref Range   MRSA by PCR NEGATIVE NEGATIVE  CBC     Status: None   Collection Time: 01/05/16  8:05 PM  Result Value Ref Range   WBC 9.7 4.0 - 10.5 K/uL   RBC 4.01 3.87 - 5.11 MIL/uL   Hemoglobin 12.3 12.0 - 15.0 g/dL   HCT 21.336.4 08.636.0 - 57.846.0 %   MCV 90.8 78.0 - 100.0 fL   MCH 30.7 26.0 - 34.0 pg   MCHC 33.8 30.0 - 36.0 g/dL   RDW  13.5 11.5 - 15.5 %   Platelets 195 150 - 400 K/uL   Cervix is 80% 3.5 -2 Vertex AROM Clear fluid  Continue Pitocin  Follow labor curve

## 2016-01-05 NOTE — Anesthesia Procedure Notes (Signed)
Epidural Patient location during procedure: OB Start time: 01/05/2016 9:46 PM End time: 01/05/2016 9:50 PM  Staffing Anesthesiologist: Leilani AbleHATCHETT, Haidar Performed: anesthesiologist   Preanesthetic Checklist Completed: patient identified, surgical consent, pre-op evaluation, timeout performed, IV checked, risks and benefits discussed and monitors and equipment checked  Epidural Patient position: sitting Prep: site prepped and draped and DuraPrep Patient monitoring: continuous pulse ox and blood pressure Approach: midline Location: L3-L4 Injection technique: LOR air  Needle:  Needle type: Tuohy  Needle gauge: 17 G Needle length: 9 cm and 9 Needle insertion depth: 5 cm cm Catheter type: closed end flexible Catheter size: 19 Gauge Catheter at skin depth: 10 cm Test dose: negative and Other  Assessment Sensory level: T9 Events: blood not aspirated, injection not painful, no injection resistance, negative IV test and no paresthesia  Additional Notes Reason for block:procedure for pain

## 2016-01-05 NOTE — MAU Provider Note (Signed)
History     CSN: [id number]  Arrival date and time: 01/05/16 1339   First Provider Initiated Contact with Patient 01/05/16 1418      Chief Complaint  Patient presents with  . Hypertension   HPI Sonia Martinez is a 35 y.o. G2P1001 at 972w4d who [facility]. OB hx significant for preeclampsia in previous pregnancy. Denies hypertension outside of pregnancy. Was seen in office yesterday; had elevated DBP & elevated LFT. Called today by Dr. Renaldo FiddlerAdkins & sent to MAU for repeat labs & BP check.  Headache since this morning. Rates pain 2/10. Has not treated. Also reports some flashes in her vision this morning that she attributes to being "hot & tired"; symptoms did not continue. Denies VB, epigastric pain, or LOF. Positive fetal movement.   OB History    Gravida Para Term Preterm AB Living   2 1 1     1    SAB TAB Ectopic Multiple Live Births           1      Past Medical History:  Diagnosis Date  . Asthma   . IBS (irritable bowel syndrome)   . Migraine without aura, without mention of intractable migraine without mention of status migrainosus 06/05/2013  . Obesity   . Oligohydramnios    2013  . Pre-eclampsia    2013  . Pregnancy induced hypertension     Past Surgical History:  Procedure Laterality Date  . SEPTOPLASTY    . WISDOM TOOTH EXTRACTION      Family History  Problem Relation Age of Onset  . Hypertension Mother   . Cancer - Other Mother     uterine  . Arthritis Mother   . Heart disease Maternal Grandfather   . Hyperlipidemia Maternal Grandfather   . Hypertension Maternal Grandfather   . Diabetes Paternal Grandmother   . Hyperlipidemia Paternal Grandmother   . Stroke Paternal Grandmother   . Hypertension Paternal Grandmother   . Lung cancer Paternal Grandfather   . Diabetes Paternal Grandfather   . Cancer - Other Paternal Grandfather     lung  . Hyperlipidemia Paternal Grandfather   . Hypertension Paternal Grandfather   . Hypertension  Father   . Arthritis Maternal Grandmother   . Hyperlipidemia Maternal Grandmother   . Stroke Maternal Grandmother   . Hypertension Maternal Grandmother   . Anesthesia problems Neg Hx   . Malignant hyperthermia Neg Hx   . Pseudochol deficiency Neg Hx   . Hypotension Neg Hx   . Migraines Neg Hx     Social History  Substance Use Topics  . Smoking status: Never Smoker  . Smokeless tobacco: Never Used  . Alcohol use No    Allergies:  Allergies  Allergen Reactions  . Ciprofloxacin Hives  . Coconut Oil Hives  . Hydrocortisone Hives    Prescriptions Prior to Admission  Medication Sig Dispense Refill Last Dose  . albuterol (VENTOLIN HFA) 108 (90 Base) MCG/ACT inhaler Inhale 2 puffs into the lungs every 6 (six) hours as needed for wheezing or shortness of breath. 18 g 6 01/05/2016 at Unknown time  . aspirin EC 81 MG tablet Take 81 mg by mouth daily.   01/04/2016 at Unknown time  . calcium carbonate (TUMS - DOSED IN MG ELEMENTAL CALCIUM) 500 MG chewable tablet Chew 2 tablets by mouth 2 (two) times daily.   01/04/2016 at Unknown time  . cetirizine (ZYRTEC ALLERGY) 10 MG tablet Take 1 tablet (10 mg total) by mouth daily.  90 tablet 3 01/04/2016 at Unknown time  . Docusate Calcium (STOOL SOFTENER PO) Take by mouth.   01/04/2016 at Unknown time  . fluticasone (FLONASE) 50 MCG/ACT nasal spray Place 2 sprays into both nostrils daily. 2 sprays in each nostril 16 g 11 01/05/2016 at Unknown time  . Fluticasone-Salmeterol (ADVAIR DISKUS IN) Inhale into the lungs.   01/05/2016 at Unknown time  . montelukast (SINGULAIR) 10 MG tablet Take 1 tablet (10 mg total) by mouth daily. 90 tablet 3 01/04/2016 at Unknown time  . Multiple Vitamin (MULTIVITAMIN) tablet Take 1 tablet by mouth daily.   01/04/2016 at Unknown time  . pyridoxine (B-6) 100 MG tablet Take 100 mg by mouth daily.   01/04/2016 at Unknown time  . ranitidine (ZANTAC) 150 MG tablet Take 150 mg by mouth as needed.    01/05/2016 at Unknown time   . vitamin C (ASCORBIC ACID) 500 MG tablet Take 500 mg by mouth daily.   01/04/2016 at Unknown time    Review of Systems  Constitutional: Negative.   Eyes: Negative for blurred vision.  Respiratory: Negative for shortness of breath.   Cardiovascular: Negative for chest pain.  Gastrointestinal: Positive for abdominal pain (occ ctx).  Genitourinary: Negative.   Neurological: Positive for headaches. Negative for dizziness.   Physical Exam   Blood pressure 131/77, pulse 95, resp. rate 20, last menstrual period 04/17/2015, unknown if currently breastfeeding.  Physical Exam  Nursing note and vitals reviewed. Constitutional: She is oriented to person, place, and time. She appears well-developed and well-nourished. No distress.  HENT:  Head: Normocephalic and atraumatic.  Eyes: Conjunctivae are normal. Right eye exhibits no discharge. Left eye exhibits no discharge. No scleral icterus.  Neck: Normal range of motion.  Cardiovascular: Normal rate, regular rhythm and normal heart sounds.   No murmur heard. Respiratory: Effort normal and breath sounds normal. No respiratory distress. She has no wheezes.  GI: Soft.  Neurological: She is alert and oriented to person, place, and time.  Skin: Skin is warm and dry. She is not diaphoretic.  Psychiatric: She has a normal mood and affect. Her behavior is normal. Judgment and thought content normal.   Fetal Tracing:  Baseline: 135 Variability: moderate Accelerations: 15x15 Decelerations: none  Toco: irr ctx   MAU Course  Procedures Results for orders placed or performed during the hospital encounter of 01/05/16 (from the past 24 hour(s))  Protein / creatinine ratio, urine     Status: None   Collection Time: 01/05/16  1:55 PM  Result Value Ref Range   Creatinine, Urine 203.00 mg/dL   Total Protein, Urine 31 mg/dL   Protein Creatinine Ratio 0.15 0.00 - 0.15 mg/mg[Cre]  CBC     Status: Abnormal   Collection Time: 01/05/16  2:04 PM   Result Value Ref Range   WBC 7.1 4.0 - 10.5 K/uL   RBC 3.87 3.87 - 5.11 MIL/uL   Hemoglobin 12.2 12.0 - 15.0 g/dL   HCT 40.935.2 (L) 81.136.0 - 91.446.0 %   MCV 91.0 78.0 - 100.0 fL   MCH 31.5 26.0 - 34.0 pg   MCHC 34.7 30.0 - 36.0 g/dL   RDW 78.213.6 95.611.5 - 21.315.5 %   Platelets 193 150 - 400 K/uL  Comprehensive metabolic panel     Status: Abnormal   Collection Time: 01/05/16  2:04 PM  Result Value Ref Range   Sodium 133 (L) 135 - 145 mmol/L   Potassium 3.5 3.5 - 5.1 mmol/L   Chloride 105 101 - 111  mmol/L   CO2 20 (L) 22 - 32 mmol/L   Glucose, Bld 119 (H) 65 - 99 mg/dL   BUN 6 6 - 20 mg/dL   Creatinine, Ser 1.61 0.44 - 1.00 mg/dL   Calcium 8.3 (L) 8.9 - 10.3 mg/dL   Total Protein 6.2 (L) 6.5 - 8.1 g/dL   Albumin 2.6 (L) 3.5 - 5.0 g/dL   AST 096 (H) 15 - 41 U/L   ALT 155 (H) 14 - 54 U/L   Alkaline Phosphatase 211 (H) 38 - 126 U/L   Total Bilirubin 0.6 0.3 - 1.2 mg/dL   GFR calc non Af Amer >60 >60 mL/min   GFR calc Af Amer >60 >60 mL/min   Anion gap 8 5 - 15    MDM CBC, CMP, urine PCR Normotensive in MAU Dr. Vincente Poli on unit to discuss admission with patient Assessment and Plan  A: Preeclampsia in 3rd trimester   Judeth Horn 01/05/2016, 2:18 PM

## 2016-01-05 NOTE — H&P (Signed)
Sonia Martinez is a 35 y.o. G 2 P 1 at 37 weeks and 4 / 7 presents for management of preeclampsia. She was seen yesterday in the office - BP 128/90  LFTs elevated 115/142 Advised to return to MAU today for labs. Complains of mild headache. Had preeclampsia last pregnancy. OB History    Gravida Para Term Preterm AB Living   2 1 1     1    SAB TAB Ectopic Multiple Live Births           1     Past Medical History:  Diagnosis Date  . Asthma   . IBS (irritable bowel syndrome)   . Migraine without aura, without mention of intractable migraine without mention of status migrainosus 06/05/2013  . Obesity   . Oligohydramnios    2013  . Pre-eclampsia    2013  . Pregnancy induced hypertension    Past Surgical History:  Procedure Laterality Date  . SEPTOPLASTY    . WISDOM TOOTH EXTRACTION     Family History: family history includes Arthritis in her maternal grandmother and mother; Cancer - Other in her mother and paternal grandfather; Diabetes in her paternal grandfather and paternal grandmother; Heart disease in her maternal grandfather; Hyperlipidemia in her maternal grandfather, maternal grandmother, paternal grandfather, and paternal grandmother; Hypertension in her father, maternal grandfather, maternal grandmother, mother, paternal grandfather, and paternal grandmother; Lung cancer in her paternal grandfather; Stroke in her maternal grandmother and paternal grandmother. Social History:  reports that she has never smoked. She has never used smokeless tobacco. She reports that she does not drink alcohol or use drugs.     Maternal Diabetes: No Genetic Screening: Normal Maternal Ultrasounds/Referrals: Normal Fetal Ultrasounds or other Referrals:  None Maternal Substance Abuse:  No Significant Maternal Medications:  None Significant Maternal Lab Results:  None Other Comments:  None  Review of Systems  All other systems reviewed and are negative.  History Dilation: 3 Effacement  (%): 80 Station: -2 Exam by:: Dr. Vincente PoliGrewal Blood pressure 141/94, pulse 105, resp. rate 20, last menstrual period 04/17/2015, unknown if currently breastfeeding. Maternal Exam:  Abdomen: Fetal presentation: vertex     Fetal Exam Fetal State Assessment: Category I - tracings are normal.     Physical Exam  Nursing note and vitals reviewed. Constitutional: She appears well-developed.  HENT:  Head: Normocephalic.  Eyes: Pupils are equal, round, and reactive to light.  Neck: Normal range of motion.  Cardiovascular: Normal rate and regular rhythm.     Prenatal labs: ABO, Rh:   Antibody:   Rubella:   RPR:    HBsAg:    HIV:    GBS:      Results for orders placed or performed during the hospital encounter of 01/05/16 (from the past 24 hour(s))  Protein / creatinine ratio, urine     Status: None   Collection Time: 01/05/16  1:55 PM  Result Value Ref Range   Creatinine, Urine 203.00 mg/dL   Total Protein, Urine 31 mg/dL   Protein Creatinine Ratio 0.15 0.00 - 0.15 mg/mg[Cre]  CBC     Status: Abnormal   Collection Time: 01/05/16  2:04 PM  Result Value Ref Range   WBC 7.1 4.0 - 10.5 K/uL   RBC 3.87 3.87 - 5.11 MIL/uL   Hemoglobin 12.2 12.0 - 15.0 g/dL   HCT 69.635.2 (L) 29.536.0 - 28.446.0 %   MCV 91.0 78.0 - 100.0 fL   MCH 31.5 26.0 - 34.0 pg   MCHC 34.7 30.0 -  36.0 g/dL   RDW 40.913.6 81.111.5 - 91.415.5 %   Platelets 193 150 - 400 K/uL  Comprehensive metabolic panel     Status: Abnormal   Collection Time: 01/05/16  2:04 PM  Result Value Ref Range   Sodium 133 (L) 135 - 145 mmol/L   Potassium 3.5 3.5 - 5.1 mmol/L   Chloride 105 101 - 111 mmol/L   CO2 20 (L) 22 - 32 mmol/L   Glucose, Bld 119 (H) 65 - 99 mg/dL   BUN 6 6 - 20 mg/dL   Creatinine, Ser 7.820.71 0.44 - 1.00 mg/dL   Calcium 8.3 (L) 8.9 - 10.3 mg/dL   Total Protein 6.2 (L) 6.5 - 8.1 g/dL   Albumin 2.6 (L) 3.5 - 5.0 g/dL   AST 956124 (H) 15 - 41 U/L   ALT 155 (H) 14 - 54 U/L   Alkaline Phosphatase 211 (H) 38 - 126 U/L   Total  Bilirubin 0.6 0.3 - 1.2 mg/dL   GFR calc non Af Amer >60 >60 mL/min   GFR calc Af Amer >60 >60 mL/min   Anion gap 8 5 - 15    Assessment/Plan: IUP at 37 w 4 days Elevated Liver function tests - most likely secondary to Preeclampsia Admit to L and D Start Magnesium for seizure prophylaxis Pitocin AROM when possible Plan of care reviewed with patient  Lynnsie Linders L 01/05/2016, 3:22 PM

## 2016-01-05 NOTE — MAU Note (Signed)
Pt seen in MD office yesterday, had elevated liver enzymes, was called & told to come to MAU.  Pt states she has been having irregular uc's since Thursday.  Denies bleeding or LOF.  Pt has HA today, had some floaters this morning, none now.

## 2016-01-05 NOTE — Lactation Note (Signed)
AD for Lactation met with patient on 12/24/15 at patient's request.  Patient stated that she does not want breastfeeding offered during this visit.  Education has been provided, and pt states she has discussed feeding options with her pediatrician.  After a long discussion, patient continues to states that she is certain about her decision to bottle feed and does not want breastfeeding discussed.  [facility] RN and Lactation consultant are aware of patient's preferences.  Susan Paisley Hedrick, RN 01/05/2016 10:12 PM  

## 2016-01-06 ENCOUNTER — Encounter (HOSPITAL_COMMUNITY): Payer: Self-pay | Admitting: *Deleted

## 2016-01-06 LAB — COMPREHENSIVE METABOLIC PANEL
ALT: 183 U/L — ABNORMAL HIGH (ref 14–54)
AST: 156 U/L — ABNORMAL HIGH (ref 15–41)
Albumin: 2.6 g/dL — ABNORMAL LOW (ref 3.5–5.0)
Alkaline Phosphatase: 229 U/L — ABNORMAL HIGH (ref 38–126)
Anion gap: 8 (ref 5–15)
BUN: 5 mg/dL — ABNORMAL LOW (ref 6–20)
CHLORIDE: 105 mmol/L (ref 101–111)
CO2: 21 mmol/L — AB (ref 22–32)
Calcium: 7.2 mg/dL — ABNORMAL LOW (ref 8.9–10.3)
Creatinine, Ser: 0.61 mg/dL (ref 0.44–1.00)
GFR calc non Af Amer: >60 mL/min (ref >60)
Glucose, Bld: 108 mg/dL — ABNORMAL HIGH (ref 65–99)
Potassium: 4.4 mmol/L (ref 3.5–5.1)
SODIUM: 134 mmol/L — AB (ref 135–145)
Total Bilirubin: 1.5 mg/dL — ABNORMAL HIGH (ref 0.3–1.2)
Total Protein: 5.6 g/dL — ABNORMAL LOW (ref 6.5–8.1)

## 2016-01-06 LAB — CBC
HCT: 37.6 % (ref 36.0–46.0)
Hemoglobin: 13.2 g/dL (ref 12.0–15.0)
MCH: 31.6 pg (ref 26.0–34.0)
MCHC: 35.1 g/dL (ref 30.0–36.0)
MCV: 90 fL (ref 78.0–100.0)
PLATELETS: 195 10*3/uL (ref 150–400)
RBC: 4.18 MIL/uL (ref 3.87–5.11)
RDW: 13.5 % (ref 11.5–15.5)
WBC: 17.7 10*3/uL — ABNORMAL HIGH (ref 4.0–10.5)

## 2016-01-06 LAB — RUBELLA SCREEN: Rubella: 3.39 {index}

## 2016-01-06 LAB — RPR: RPR: NONREACTIVE

## 2016-01-06 MED ORDER — MEASLES, MUMPS & RUBELLA VAC ~~LOC~~ INJ
0.5000 mL | INJECTION | Freq: Once | SUBCUTANEOUS | Status: DC
  Filled 2016-01-06: qty 0.5

## 2016-01-06 MED ORDER — FAMOTIDINE 20 MG PO TABS
20.0000 mg | ORAL_TABLET | Freq: Two times a day (BID) | ORAL | Status: DC
  Administered 2016-01-06 – 2016-01-08 (×3): 20 mg via ORAL
  Filled 2016-01-06 (×4): qty 1

## 2016-01-06 MED ORDER — OXYCODONE-ACETAMINOPHEN 5-325 MG PO TABS: 1.0000 | ORAL_TABLET | ORAL | Status: DC | PRN

## 2016-01-06 MED ORDER — MAGNESIUM SULFATE 50 % IJ SOLN
2.0000 g/h | INTRAMUSCULAR | Status: AC
  Administered 2016-01-06: 2 g/h via INTRAVENOUS
  Filled 2016-01-06 (×2): qty 80

## 2016-01-06 MED ORDER — IBUPROFEN 600 MG PO TABS
600.0000 mg | ORAL_TABLET | Freq: Four times a day (QID) | ORAL | Status: DC
  Administered 2016-01-06 – 2016-01-08 (×8): 600 mg via ORAL
  Filled 2016-01-06 (×9): qty 1

## 2016-01-06 MED ORDER — DIBUCAINE 1 % RE OINT: 1.0000 "application " | TOPICAL_OINTMENT | RECTAL | Status: DC | PRN

## 2016-01-06 MED ORDER — MEDROXYPROGESTERONE ACETATE 150 MG/ML IM SUSP: 150.0000 mg | INTRAMUSCULAR | Status: DC | PRN

## 2016-01-06 MED ORDER — SIMETHICONE 80 MG PO CHEW: 80.0000 mg | CHEWABLE_TABLET | ORAL | Status: DC | PRN

## 2016-01-06 MED ORDER — COCONUT OIL OIL: 1.0000 "application " | TOPICAL_OIL | Status: DC | PRN

## 2016-01-06 MED ORDER — ZOLPIDEM TARTRATE 5 MG PO TABS: 5.0000 mg | ORAL_TABLET | Freq: Every evening | ORAL | Status: DC | PRN

## 2016-01-06 MED ORDER — WITCH HAZEL-GLYCERIN EX PADS: 1.0000 "application " | MEDICATED_PAD | CUTANEOUS | Status: DC | PRN

## 2016-01-06 MED ORDER — SENNOSIDES-DOCUSATE SODIUM 8.6-50 MG PO TABS
2.0000 | ORAL_TABLET | ORAL | Status: DC
  Administered 2016-01-06 – 2016-01-07 (×2): 2 via ORAL
  Filled 2016-01-06 (×2): qty 2

## 2016-01-06 MED ORDER — ACETAMINOPHEN 325 MG PO TABS
650.0000 mg | ORAL_TABLET | ORAL | Status: DC | PRN
Start: 2016-01-06 — End: 2016-01-08

## 2016-01-06 MED ORDER — OXYCODONE-ACETAMINOPHEN 5-325 MG PO TABS: 2.0000 | ORAL_TABLET | ORAL | Status: DC | PRN

## 2016-01-06 MED ORDER — FLEET ENEMA 7-19 GM/118ML RE ENEM: 1.0000 | ENEMA | Freq: Every day | RECTAL | Status: DC | PRN

## 2016-01-06 MED ORDER — LACTATED RINGERS IV SOLN
INTRAVENOUS | Status: DC
  Administered 2016-01-06: 10:00:00 via INTRAVENOUS

## 2016-01-06 MED ORDER — ONDANSETRON HCL 4 MG PO TABS: 4.0000 mg | ORAL_TABLET | ORAL | Status: DC | PRN

## 2016-01-06 MED ORDER — TETANUS-DIPHTH-ACELL PERTUSSIS 5-2.5-18.5 LF-MCG/0.5 IM SUSP
0.5000 mL | Freq: Once | INTRAMUSCULAR | Status: DC
  Filled 2016-01-06: qty 0.5

## 2016-01-06 MED ORDER — DIPHENHYDRAMINE HCL 25 MG PO CAPS: 25.0000 mg | ORAL_CAPSULE | Freq: Four times a day (QID) | ORAL | Status: DC | PRN

## 2016-01-06 MED ORDER — ONDANSETRON HCL 4 MG/2ML IJ SOLN: 4.0000 mg | INTRAMUSCULAR | Status: DC | PRN

## 2016-01-06 MED ORDER — BISACODYL 10 MG RE SUPP
10.0000 mg | Freq: Every day | RECTAL | Status: DC | PRN
  Filled 2016-01-06: qty 1

## 2016-01-06 MED ORDER — OXYTOCIN 40 UNITS IN LACTATED RINGERS INFUSION - SIMPLE MED
INTRAVENOUS | Status: AC
  Filled 2016-01-06: qty 1000

## 2016-01-06 MED ORDER — BENZOCAINE-MENTHOL 20-0.5 % EX AERO
1.0000 "application " | INHALATION_SPRAY | CUTANEOUS | Status: DC | PRN
  Administered 2016-01-06: 1 via TOPICAL
  Filled 2016-01-06: qty 56

## 2016-01-06 MED ORDER — PRENATAL MULTIVITAMIN CH
1.0000 | ORAL_TABLET | Freq: Every day | ORAL | Status: DC
  Administered 2016-01-06 – 2016-01-07 (×2): 1 via ORAL
  Filled 2016-01-06 (×3): qty 1

## 2016-01-06 NOTE — Progress Notes (Signed)
Report given to Everardo AllSarah Riffey, RN via telephone.  Pt transferred to Room 133 via wheelchair.  Pt's chart given to ForestEsther, NS.

## 2016-01-06 NOTE — Progress Notes (Signed)
Pt has remained normotensive all day and has had 3300 ml of urine since 7:00am.  DTRs 1+, no clonus all day.  Pt denies headache, visual disturbances, or epigastric pain.  Magnesium drip discontinued at 17:00.  Phoned Dr. Renaldo FiddlerAdkins with above information.  See order for transfer.

## 2016-01-07 ENCOUNTER — Encounter (HOSPITAL_COMMUNITY): Payer: Self-pay | Admitting: Anesthesiology

## 2016-01-07 LAB — COMPREHENSIVE METABOLIC PANEL
ALT: 142 U/L — AB (ref 14–54)
AST: 106 U/L — AB (ref 15–41)
Albumin: 2.3 g/dL — ABNORMAL LOW (ref 3.5–5.0)
Alkaline Phosphatase: 188 U/L — ABNORMAL HIGH (ref 38–126)
Anion gap: 4 — ABNORMAL LOW (ref 5–15)
BUN: 5 mg/dL — AB (ref 6–20)
CHLORIDE: 108 mmol/L (ref 101–111)
CO2: 25 mmol/L (ref 22–32)
Calcium: 7.5 mg/dL — ABNORMAL LOW (ref 8.9–10.3)
Creatinine, Ser: 0.74 mg/dL (ref 0.44–1.00)
GFR calc Af Amer: >60 mL/min (ref >60)
GFR calc non Af Amer: >60 mL/min (ref >60)
GLUCOSE: 86 mg/dL (ref 65–99)
POTASSIUM: 4 mmol/L (ref 3.5–5.1)
Sodium: 137 mmol/L (ref 135–145)
Total Bilirubin: 0.7 mg/dL (ref 0.3–1.2)
Total Protein: 5 g/dL — ABNORMAL LOW (ref 6.5–8.1)

## 2016-01-07 NOTE — Anesthesia Postprocedure Evaluation (Signed)
Anesthesia Post Note  Patient: Gilman SchmidtKimberly S Hazen  Procedure(s) Performed: * No procedures listed *  Patient location during evaluation: Mother Baby Anesthesia Type: Epidural Level of consciousness: awake and alert Pain management: pain level controlled Vital Signs Assessment: post-procedure vital signs reviewed and stable Respiratory status: spontaneous breathing Cardiovascular status: stable Postop Assessment: no headache, no backache, epidural receding, patient able to bend at knees, no signs of nausea or vomiting and adequate PO intake Anesthetic complications: no     Last Vitals:  Vitals:   01/07/16 0233 01/07/16 0637  BP: (!) 107/48 (!) 119/57  Pulse: 76 66  Resp: 18 18  Temp: 37.1 C 36.7 C    Last Pain:  Vitals:   01/07/16 0642  TempSrc:   PainSc: 0-No pain   Pain Goal: Patients Stated Pain Goal: 3 (01/06/16 1600)               Zamari Bonsall

## 2016-01-07 NOTE — Plan of Care (Signed)
Problem: Respiratory: Goal: Ability to maintain adequate ventilation will improve Patient has a history of asthma. She has a order for inhaler. Patient declines need for medication. Respiratory status is WNL.

## 2016-01-07 NOTE — Progress Notes (Signed)
Post Partum Day 1 Subjective: no complaints, up ad lib, voiding, tolerating PO, + flatus and denies HA, RUQ pain  Objective: Blood pressure (!) 119/57, pulse 66, temperature 98.1 F (36.7 C), temperature source Oral, resp. rate 18, height 5' 7.5" (1.715 m), weight 268 lb (121.6 kg), last menstrual period 04/17/2015, SpO2 97 %, unknown if currently breastfeeding.  Physical Exam:  General: alert and cooperative Lochia: appropriate Uterine Fundus: firm Incision: healing well DVT Evaluation: No evidence of DVT seen on physical exam. Negative Homan's sign. No cords or calf tenderness. Calf/Ankle edema is present. DTR's 1+ no clonus   Recent Labs  01/05/16 2005 01/06/16 0618  HGB 12.3 13.2  HCT 36.4 37.6    Assessment/Plan: Plan for discharge tomorrow and Circumcision prior to discharge CBC and CMP ordered for am  LOS: 2 days   Noelia Lenart G 01/07/2016, 8:35 AM

## 2016-01-07 NOTE — Anesthesia Postprocedure Evaluation (Addendum)
Anesthesia Post Note  Patient: Sonia Martinez  Procedure(s) Performed: * No procedures listed *  Patient location during evaluation: Mother Baby Anesthesia Type: Epidural Level of consciousness: awake Pain management: pain level controlled Vital Signs Assessment: post-procedure vital signs reviewed and stable Cardiovascular status: stable Postop Assessment: no headache, no backache, epidural receding, patient able to bend at knees and no signs of nausea or vomiting Anesthetic complications: no     Last Vitals:  Vitals:   01/07/16 0233 01/07/16 0637  BP: (!) 107/48 (!) 119/57  Pulse: 76 66  Resp: 18 18  Temp: 37.1 C 36.7 C    Last Pain:  Vitals:   01/07/16 0642  TempSrc:   PainSc: 0-No pain   Pain Goal: Patients Stated Pain Goal: 3 (01/06/16 1600)               Rodneisha Bonnet JR,JOHN Susann GivensFRANKLIN

## 2016-01-08 LAB — CBC
HEMATOCRIT: 34.9 % — AB (ref 36.0–46.0)
HEMOGLOBIN: 12.4 g/dL (ref 12.0–15.0)
MCH: 32.4 pg (ref 26.0–34.0)
MCHC: 35.5 g/dL (ref 30.0–36.0)
MCV: 91.1 fL (ref 78.0–100.0)
Platelets: 188 10*3/uL (ref 150–400)
RBC: 3.83 MIL/uL — AB (ref 3.87–5.11)
RDW: 14 % (ref 11.5–15.5)
WBC: 8.3 10*3/uL (ref 4.0–10.5)

## 2016-01-08 LAB — COMPREHENSIVE METABOLIC PANEL
ALT: 125 U/L — ABNORMAL HIGH (ref 14–54)
ANION GAP: 6 (ref 5–15)
AST: 82 U/L — ABNORMAL HIGH (ref 15–41)
Albumin: 2.5 g/dL — ABNORMAL LOW (ref 3.5–5.0)
Alkaline Phosphatase: 169 U/L — ABNORMAL HIGH (ref 38–126)
BILIRUBIN TOTAL: 0.5 mg/dL (ref 0.3–1.2)
BUN: 8 mg/dL (ref 6–20)
CHLORIDE: 106 mmol/L (ref 101–111)
CO2: 23 mmol/L (ref 22–32)
Calcium: 8 mg/dL — ABNORMAL LOW (ref 8.9–10.3)
Creatinine, Ser: 0.66 mg/dL (ref 0.44–1.00)
Glucose, Bld: 79 mg/dL (ref 65–99)
POTASSIUM: 3.6 mmol/L (ref 3.5–5.1)
Sodium: 135 mmol/L (ref 135–145)
TOTAL PROTEIN: 5.7 g/dL — AB (ref 6.5–8.1)

## 2016-01-08 NOTE — Discharge Summary (Signed)
Obstetric Discharge Summary Reason for Admission: induction of labor and observation/evaluation Prenatal Procedures: ultrasound Intrapartum Procedures: spontaneous vaginal delivery Postpartum Procedures: none Complications-Operative and Postpartum: 1 degree perineal laceration Hemoglobin  Date Value Ref Range Status  01/08/2016 12.4 12.0 - 15.0 g/dL Final   HCT  Date Value Ref Range Status  01/08/2016 34.9 (L) 36.0 - 46.0 % Final    Physical Exam:  General: alert and cooperative Lochia: appropriate Uterine Fundus: firm Incision: healing well DVT Evaluation: No evidence of DVT seen on physical exam. Negative Homan's sign. No cords or calf tenderness. Calf/Ankle edema is present.  Discharge Diagnoses: Term Pregnancy-delivered  Discharge Information: Date: 01/08/2016 Activity: pelvic rest Diet: routine Medications: PNV and Ibuprofen Condition: stable Instructions: refer to practice specific booklet Discharge to: home   Newborn Data: Live born female  Birth Weight: 7 lb 0.5 oz (3190 g) APGAR: 9, 9  Home with mother.  CURTIS,CAROL G 01/08/2016, 7:22 AM

## 2016-01-08 NOTE — Progress Notes (Signed)
Post discharge chart review completed.  

## 2016-01-08 NOTE — Progress Notes (Signed)
Post Partum Day 2 Subjective: no complaints, up ad lib, voiding, tolerating PO, + flatus and denies HA, RUQ pain  Objective: Blood pressure 119/71, pulse (!) 59, temperature 98.3 F (36.8 C), temperature source Oral, resp. rate 16, height 5' 7.5" (1.715 m), weight 268 lb (121.6 kg), last menstrual period 04/17/2015, SpO2 97 %, unknown if currently breastfeeding.  Physical Exam:  General: alert and cooperative Lochia: appropriate Uterine Fundus: firm Incision: healing well DVT Evaluation: No evidence of DVT seen on physical exam. Negative Homan's sign. No cords or calf tenderness. Calf/Ankle edema is present.   Recent Labs  01/06/16 0618 01/08/16 0541  HGB 13.2 12.4  HCT 37.6 34.9*  Liver enzymes improving  Assessment/Plan: Discharge home and Breastfeeding RTO in 1 week for labs and recheck  LOS: 3 days   Sonia Martinez G 01/08/2016, 7:18 AM

## 2016-01-12 DIAGNOSIS — R945 Abnormal results of liver function studies: Secondary | ICD-10-CM | POA: Diagnosis not present

## 2016-01-15 ENCOUNTER — Inpatient Hospital Stay (HOSPITAL_COMMUNITY): Payer: 59

## 2016-01-15 ENCOUNTER — Inpatient Hospital Stay (HOSPITAL_COMMUNITY)
Admission: RE | Admit: 2016-01-15 | Payer: 59 | Source: Ambulatory Visit | Attending: Obstetrics and Gynecology | Admitting: Obstetrics and Gynecology

## 2016-02-15 ENCOUNTER — Telehealth: Payer: Self-pay | Admitting: Family Medicine

## 2016-02-15 NOTE — Telephone Encounter (Signed)
Patient fell on carpeted hardwood stairs X 2 weeks ago on Wednesday, patient has been icing and resting using Ibuprofen 600 mg in am and 600 mg in the evening and pain is getting worse not better , Patient an appointment for 6 weeks postpartum visit on Thursday but patient has to return to work in one week and if she has fractured something.  Patient rated the pain at  6 on 1-10 scale.  Scheduled patient for tomorrow at 2 would you like X-ray today?

## 2016-02-15 NOTE — Telephone Encounter (Signed)
What did she injure - Sonia Martinez, ankle, ?

## 2016-02-15 NOTE — Telephone Encounter (Signed)
Dr.Cook was told it was pts tailbone and he stated she does not need X-Ray she just needs to be seen.

## 2016-02-15 NOTE — Telephone Encounter (Signed)
Patient aware of appointment

## 2016-02-15 NOTE — Telephone Encounter (Signed)
Pt called and stated about a week and a half ago that she fell down the stairs and landed on her tailbone. At that point she was 4 weeks postpartum. She has been icing, and taking ibprofuen. She wants to know if she needs to come in or if there is something else that she should do. It hurts to sit, walk, and stand (long period of time). Please advise, thank you!  Call pt @ [phone]

## 2016-02-16 ENCOUNTER — Ambulatory Visit (INDEPENDENT_AMBULATORY_CARE_PROVIDER_SITE_OTHER): Payer: 59 | Admitting: Family Medicine

## 2016-02-16 DIAGNOSIS — M7918 Myalgia, other site: Secondary | ICD-10-CM | POA: Insufficient documentation

## 2016-02-16 DIAGNOSIS — M791 Myalgia: Secondary | ICD-10-CM

## 2016-02-16 NOTE — Progress Notes (Signed)
   Subjective:  Patient ID: Gilman SchmidtKimberly S Boldin, female    DOB: Apr 15, 1980  Age: 36 y.o. MRN: [id number]  CC: Fall  HPI:  36 year old female presents for evaluation following a recent fall.  Patient states that she was going down the stairs and fell directly on her tailbone approximately 2 weeks ago. Since that time she's had severe pain particularly with sitting. She's been taking ibuprofen with some relief but no significant resolution. She's also been using a doughnut when she sits. No reports of back pain or radicular symptoms. No other associated symptoms. No other complaints or concerns at this time.  Social Hx   Social History   Social History  . Marital status: Married    Spouse name: N/A  . Number of children: 1  . Years of education: MA   Occupational History  . Finance Manager     [employer]   Social History Main Topics  . Smoking status: Never Smoker  . Smokeless tobacco: Never Used  . Alcohol use No  . Drug use: No  . Sexual activity: Yes   Other Topics Concern  . Not on file   Social History Narrative   Patient is married with one child.   Patient is right handed.   Patient has a Master's degree.   Patient drinks 2 cups daily.   Review of Systems  Constitutional: Negative.   Musculoskeletal:       Buttock pain.   Objective:  Pulse 90   Temp 98.8 F (37.1 C) (Oral)   Wt 250 lb 9.6 oz (113.7 kg)   SpO2 98%   BMI 38.67 kg/m   BP/Weight 02/16/2016 01/08/2016 01/05/2016  Systolic BP - 119 -  Diastolic BP - 71 -  Wt. (Lbs) 250.6 - 268  BMI 38.67 - 41.36   Physical Exam  Constitutional: She is oriented to person, place, and time. She appears well-developed. No distress.  Cardiovascular: Normal rate and regular rhythm.   Pulmonary/Chest: Effort normal and breath sounds normal.  Musculoskeletal:  Tenderness - lower sacrum/coccyx.  Neurological: She is alert and oriented to person, place, and time.  Psychiatric: She has a normal mood and  affect.  Vitals reviewed.  Lab Results  Component Value Date   WBC 8.3 01/08/2016   HGB 12.4 01/08/2016   HCT 34.9 (L) 01/08/2016   PLT 188 01/08/2016   GLUCOSE 79 01/08/2016   ALT 125 (H) 01/08/2016   AST 82 (H) 01/08/2016   NA 135 01/08/2016   K 3.6 01/08/2016   CL 106 01/08/2016   CREATININE 0.66 01/08/2016   BUN 8 01/08/2016   CO2 23 01/08/2016   Assessment & Plan:   Problem List Items Addressed This Visit    Buttock pain    New acute problem. Suspect coccyx fracture versus bone contusion. No indications for imaging. Advised ibuprofen 800 mg 3 times daily as needed. Advised to try the medical supply store regarding a better doughnut.        Follow-up: PRN  Everlene OtherJayce Gladys Deckard DO [facility] [place]

## 2016-02-16 NOTE — Patient Instructions (Signed)
Ibuprofen 800 mg 3 times daily as needed.  Try a medical grade donut.  Call with concerns  Take care  Dr. Adriana Simasook

## 2016-02-16 NOTE — Assessment & Plan Note (Signed)
New acute problem. Suspect coccyx fracture versus bone contusion. No indications for imaging. Advised ibuprofen 800 mg 3 times daily as needed. Advised to try the medical supply store regarding a better doughnut.

## 2016-02-16 NOTE — Progress Notes (Signed)
Pre visit review using our clinic review tool, if applicable. No additional management support is needed unless otherwise documented below in the visit note. 

## 2016-02-18 DIAGNOSIS — Z1389 Encounter for screening for other disorder: Secondary | ICD-10-CM | POA: Diagnosis not present

## 2016-02-18 DIAGNOSIS — R945 Abnormal results of liver function studies: Secondary | ICD-10-CM | POA: Diagnosis not present

## 2016-02-19 MED FILL — FLUTICASONE PROP 50 MCG SPR: 50 | 30 days supply | Qty: 16 | Fill #2

## 2016-02-24 MED FILL — ADVAIR 100/50 DISKUS: 100-50 | 90 days supply | Qty: 180 | Fill #2

## 2016-03-22 ENCOUNTER — Other Ambulatory Visit: Payer: Self-pay | Admitting: Gastroenterology

## 2016-03-22 DIAGNOSIS — R7989 Other specified abnormal findings of blood chemistry: Secondary | ICD-10-CM

## 2016-03-22 DIAGNOSIS — K581 Irritable bowel syndrome with constipation: Secondary | ICD-10-CM | POA: Diagnosis not present

## 2016-03-22 DIAGNOSIS — R748 Abnormal levels of other serum enzymes: Secondary | ICD-10-CM | POA: Diagnosis not present

## 2016-03-22 DIAGNOSIS — R1084 Generalized abdominal pain: Secondary | ICD-10-CM

## 2016-03-22 DIAGNOSIS — R945 Abnormal results of liver function studies: Secondary | ICD-10-CM

## 2016-03-22 DIAGNOSIS — R1011 Right upper quadrant pain: Secondary | ICD-10-CM | POA: Diagnosis not present

## 2016-03-28 ENCOUNTER — Encounter (HOSPITAL_COMMUNITY)
Admission: RE | Admit: 2016-03-28 | Discharge: 2016-03-28 | Disposition: A | Discharge status: Home / Self Care | Payer: 59 | Source: Ambulatory Visit | Attending: Gastroenterology | Admitting: Gastroenterology

## 2016-03-28 DIAGNOSIS — R1084 Generalized abdominal pain: Secondary | ICD-10-CM | POA: Insufficient documentation

## 2016-03-28 DIAGNOSIS — K760 Fatty (change of) liver, not elsewhere classified: Secondary | ICD-10-CM | POA: Diagnosis not present

## 2016-03-28 DIAGNOSIS — R7989 Other specified abnormal findings of blood chemistry: Secondary | ICD-10-CM | POA: Insufficient documentation

## 2016-03-28 DIAGNOSIS — R945 Abnormal results of liver function studies: Secondary | ICD-10-CM

## 2016-03-29 MED FILL — valACYclovir HCL 1 GM TABS: 1 | 1 days supply | Qty: 4 | Fill #0

## 2016-03-30 ENCOUNTER — Telehealth: Payer: Self-pay | Admitting: *Deleted

## 2016-03-30 NOTE — Telephone Encounter (Signed)
Spoke with patient states Ob/gyn has been treating her for sinus infection and prescribed antibiotic .  She has noticed that rash on leg seems to get better when she is on antibiotic .   She is allergic to cortisone cream so she can't apply that, she has been using OTC Benadryl cream.   Appointment scheduled with you to evaluate rash tomorrow .

## 2016-03-30 NOTE — Telephone Encounter (Signed)
Pt has a rash on both leg, previously she was diagnosed with a sinus infection 3 different times while having the leg rash, she was prescribed an antibiotic and the rash cleared all three times. Pt requested a call from triage.  Pt contact [phone]

## 2016-03-31 ENCOUNTER — Ambulatory Visit (INDEPENDENT_AMBULATORY_CARE_PROVIDER_SITE_OTHER): Payer: 59 | Admitting: Family Medicine

## 2016-03-31 ENCOUNTER — Encounter: Payer: Self-pay | Admitting: Family Medicine

## 2016-03-31 DIAGNOSIS — L739 Follicular disorder, unspecified: Secondary | ICD-10-CM | POA: Insufficient documentation

## 2016-03-31 HISTORY — DX: Follicular disorder, unspecified: L73.9

## 2016-03-31 MED ORDER — MUPIROCIN CALCIUM 2 % EX CREA: 1.0000 "application " | TOPICAL_CREAM | Freq: Three times a day (TID) | CUTANEOUS | 0 refills | Status: DC

## 2016-03-31 NOTE — Patient Instructions (Signed)
Antibiotic cream as prescribed.  Follow up as needed.  Take care  Dr. Adriana Simasook

## 2016-03-31 NOTE — Progress Notes (Signed)
Pre visit review using our clinic review tool, if applicable. No additional management support is needed unless otherwise documented below in the visit note. 

## 2016-03-31 NOTE — Progress Notes (Signed)
   Subjective:  Patient ID: Sonia Martinez, female    DOB: 23-Sep-1980  Age: 36 y.o. MRN: [id number]  CC: Rash  HPI:  36 year old female presents with complaints of rash.  Patient reports that she's had a rash on her lower extremities bilaterally for the past week. She states that it's red and itchy. No known inciting factor. No new exposures. No known exacerbating or relieving factors. No new indications or medication changes. No other associated symptoms. No other complaints or concerns at this time.  Social Hx   Social History   Social History  . Marital status: Married    Spouse name: N/A  . Number of children: 1  . Years of education: MA   Occupational History  . Finance Manager     [employer]   Social History Main Topics  . Smoking status: Never Smoker  . Smokeless tobacco: Never Used  . Alcohol use No  . Drug use: No  . Sexual activity: Yes   Other Topics Concern  . None   Social History Narrative   Patient is married with one child.   Patient is right handed.   Patient has a Master's degree.   Patient drinks 2 cups daily.    Review of Systems  Constitutional: Negative.   Skin: Positive for rash.   Objective:  BP 126/82   Pulse 80   Temp 98.9 F (37.2 C) (Oral)   Wt 247 lb 3 oz (112.1 kg)   SpO2 97%   BMI 38.14 kg/m   BP/Weight 03/31/2016 02/16/2016 01/08/2016  Systolic BP 126 - 119  Diastolic BP 82 - 71  Wt. (Lbs) 247.19 250.6 -  BMI 38.14 38.67 -   Physical Exam  Constitutional: She is oriented to person, place, and time. She appears well-developed. No distress.  Pulmonary/Chest: Effort normal.  Neurological: She is alert and oriented to person, place, and time.  Skin:  Lower extremities - small erythematous papules noted on the lower legs bilaterally.   Psychiatric: She has a normal mood and affect.  Vitals reviewed.   Lab Results  Component Value Date   WBC 8.3 01/08/2016   HGB 12.4 01/08/2016   HCT 34.9 (L) 01/08/2016   PLT  188 01/08/2016   GLUCOSE 79 01/08/2016   ALT 125 (H) 01/08/2016   AST 82 (H) 01/08/2016   NA 135 01/08/2016   K 3.6 01/08/2016   CL 106 01/08/2016   CREATININE 0.66 01/08/2016   BUN 8 01/08/2016   CO2 23 01/08/2016    Assessment & Plan:   Problem List Items Addressed This Visit    Folliculitis    New acute problem. Clinically appears to be consistent with folliculitis. Treating with mupirocin.         Meds ordered this encounter  Medications  . mupirocin cream (BACTROBAN) 2 %    Sig: Apply 1 application topically 3 (three) times daily. For 10 days.    Dispense:  30 g    Refill:  0    Follow-up: PRN  Everlene OtherJayce Anthonio Mizzell DO [facility] [place]

## 2016-03-31 NOTE — Assessment & Plan Note (Signed)
New acute problem. Clinically appears to be consistent with folliculitis. Treating with mupirocin.

## 2016-04-19 DIAGNOSIS — R748 Abnormal levels of other serum enzymes: Secondary | ICD-10-CM | POA: Diagnosis not present

## 2016-04-19 DIAGNOSIS — K8020 Calculus of gallbladder without cholecystitis without obstruction: Secondary | ICD-10-CM | POA: Diagnosis not present

## 2016-04-19 DIAGNOSIS — K760 Fatty (change of) liver, not elsewhere classified: Secondary | ICD-10-CM | POA: Diagnosis not present

## 2016-04-19 MED FILL — ALL DAY ALLERGY 10 MG TAB: 10 | 100 days supply | Qty: 100 | Fill #2

## 2016-04-19 MED FILL — MONTELUKAST SOD 10 MG TAB: 10 | 90 days supply | Qty: 90 | Fill #2 | Status: TO

## 2016-04-19 MED FILL — FLUTICASONE PROP 50 MCG SPR: 50 | 30 days supply | Qty: 16 | Fill #3

## 2016-05-02 MED FILL — CEPHALEXIN 500 MG CAPSULE: 500 | 10 days supply | Qty: 30 | Fill #0

## 2016-05-02 MED FILL — valACYclovir HCL 1 GM TABS: 1 | 1 days supply | Qty: 4 | Fill #1

## 2016-05-04 DIAGNOSIS — H5213 Myopia, bilateral: Secondary | ICD-10-CM | POA: Diagnosis not present

## 2016-05-24 MED FILL — ADVAIR 100/50 DISKUS: 100-50 | 90 days supply | Qty: 180 | Fill #3

## 2016-05-24 MED FILL — FLUTICASONE PROP 50 MCG SPR: 50 | 30 days supply | Qty: 16 | Fill #4 | Status: TO

## 2016-08-17 ENCOUNTER — Other Ambulatory Visit: Payer: Self-pay | Admitting: Family Medicine

## 2016-08-17 NOTE — Telephone Encounter (Signed)
Pt called requesting a new rx for her Fluticasone-Salmeterol (ADVAIR DISKUS IN). Please advise, thank you!  [facility] [phone] [facility], [place] - [address]  Call pt @ [phone]

## 2016-08-18 NOTE — Telephone Encounter (Signed)
Left voice mail to call back 

## 2016-08-18 NOTE — Telephone Encounter (Signed)
Pt called back returning your call. Please advise, thank you!  Call pt @ [phone]

## 2016-08-19 NOTE — Telephone Encounter (Signed)
Last office visit 04/10/16, we've never scheduled  No office visit scheduled

## 2016-08-19 NOTE — Telephone Encounter (Signed)
Advair 100/50

## 2016-08-23 MED ORDER — FLUTICASONE-SALMETEROL 250-50 MCG/DOSE IN AEPB: 1.0000 | INHALATION_SPRAY | Freq: Two times a day (BID) | RESPIRATORY_TRACT | 3 refills | Status: DC

## 2016-08-23 MED ORDER — ADVAIR DISKUS 100-50 MCG/DOSE IN AEPB: 1.0000 | INHALATION_SPRAY | Freq: Every day | RESPIRATORY_TRACT | 3 refills | Status: DC

## 2016-08-23 NOTE — Telephone Encounter (Signed)
Pt called back looking for an update. Pt only has 2 days worth of medication and wants to make sure that this gets filled for her today. Please advise, thank you!

## 2016-08-23 NOTE — Telephone Encounter (Signed)
Patient advised refill already at pharmacy

## 2016-08-23 NOTE — Telephone Encounter (Signed)
Spoke with Shon HaleMary Beth , Technician they do not show they have received script.

## 2016-08-23 NOTE — Telephone Encounter (Signed)
Script sent  

## 2016-08-23 NOTE — Addendum Note (Signed)
Addended by: Alisia FerrariBARE, KRISTEN C on: 08/23/2016 10:31 AM   Modules accepted: Orders

## 2016-10-18 ENCOUNTER — Telehealth: Payer: Self-pay | Admitting: Family Medicine

## 2016-10-18 ENCOUNTER — Other Ambulatory Visit: Payer: Self-pay | Admitting: Family Medicine

## 2016-10-18 MED ORDER — FLUTICASONE-SALMETEROL 250-50 MCG/DOSE IN AEPB: 1.0000 | INHALATION_SPRAY | Freq: Two times a day (BID) | RESPIRATORY_TRACT | 3 refills | Status: DC

## 2016-10-18 NOTE — Telephone Encounter (Signed)
Completed request, two of these came from pharmacy this am and were refilled already.

## 2016-10-18 NOTE — Telephone Encounter (Signed)
Pt called requesting a refill on her montelukast (SINGULAIR) 10 MG tablet,Fluticasone-Salmeterol (ADVAIR DISKUS) 250-50 MCG/DOSE AEPB, and fluticasone (FLONASE) 50 MCG/ACT nasal spray. Please advise, thank you!  [phone] [address] [facility], [place] - [address]

## 2016-10-25 NOTE — Telephone Encounter (Signed)
Patient called stating that Advair 250/50 was sent to the pharmacy instead of 100/50.

## 2016-10-27 NOTE — Addendum Note (Signed)
Addended by: Alisia Ferrari on: 10/27/2016 10:50 AM   Modules accepted: Orders

## 2016-10-27 NOTE — Addendum Note (Signed)
Addended by: Birdie Sons Adilen Pavelko G on: 10/27/2016 03:55 PM   Modules accepted: Orders

## 2016-10-27 NOTE — Telephone Encounter (Signed)
Last office 02/16/16 Dr Adriana Simas acute visit ,  Wrong dose previous 200/50 previously sent in  No office visit scheduled

## 2016-10-27 NOTE — Telephone Encounter (Signed)
Please find out if patient is using this every day and for what reason. It does not look like they've never discussed asthma. Thanks.

## 2016-12-16 ENCOUNTER — Other Ambulatory Visit: Payer: Self-pay | Admitting: Family Medicine

## 2016-12-20 ENCOUNTER — Encounter: Payer: Self-pay | Admitting: *Deleted

## 2016-12-21 ENCOUNTER — Telehealth: Payer: Self-pay | Admitting: Family Medicine

## 2016-12-21 MED ORDER — ADVAIR DISKUS 100-50 MCG/DOSE IN AEPB: 1.0000 | INHALATION_SPRAY | Freq: Two times a day (BID) | RESPIRATORY_TRACT | 0 refills | Status: DC

## 2016-12-21 NOTE — Telephone Encounter (Signed)
Copied from CRM [phone]. Topic: Quick Communication - See Telephone Encounter >> Dec 21, 2016 11:06 AM Windy KalataMichael, Peggy Loge L, NT wrote: CRM for notification. See Telephone encounter for:  12/21/16.

## 2016-12-21 NOTE — Telephone Encounter (Signed)
Copied from CRM [phone]. Topic: Quick Communication - See Telephone Encounter >> Dec 21, 2016 11:07 AM Windy KalataMichael, Kristen Bushway L, NT wrote: CRM for notification. See Telephone encounter for:  12/21/16.  [facility]21 to get a refill and she states she is almost out and needs it refilled asap.

## 2016-12-21 NOTE — Telephone Encounter (Signed)
She is on Advair 100-50

## 2016-12-21 NOTE — Telephone Encounter (Signed)
[facility] is saying that they cannot refill her Advair inhaler until she has physically been seen in their office. Can we send in 1 refill until she is seen by her new PCP?

## 2016-12-21 NOTE — Telephone Encounter (Signed)
Sent to pharmacy 

## 2016-12-21 NOTE — Addendum Note (Signed)
Addended by: Glori LuisSONNENBERG, Michala Deblanc G on: 12/21/2016 05:10 PM   Modules accepted: Orders

## 2016-12-21 NOTE — Telephone Encounter (Signed)
Pt had previously advair and now she has 2 puffs left;pt's son has RSV and her husband is also sick; pt previously saw Everlene OtherJayce Cook; but message noted on my chart "We received a refill request for your advair. Please call the office soon to establish care with a new provider. Once an appointment has been made, we can take care of your refill until you are seen. Please call: [phone] for an appointment. Thanks";  pt notified of this and requests appointment for this afternoon or at lunch time on 12/22/16 at the [place] [facility]; no appointments available at [facility] or [facility] [place]; pt [facility]; based on pt's schedule, pt offered and agreed to appointment on 01/04/17 at 1200 with Dr Patsy Lageropland (scheduled 30 min appointment because [facility] is [facility]); pt would like a 1 time refill to last until her upcoming appointment, or a sample if available; however per Rena at [facility] no refills can be given until seen; contacted Azerbaijanrisha at [facility] [place] regarding refill; will route to [place] [facility].

## 2016-12-21 NOTE — Telephone Encounter (Signed)
I am happy to refill. Please find out the dose the patient is using as there are 2 doses listed. I will then refill. Thanks.

## 2016-12-22 NOTE — Telephone Encounter (Signed)
Pt needs refill & will establish care with Dr. Patsy Lageropland in December.

## 2016-12-22 NOTE — Telephone Encounter (Signed)
Decline. I have not taken new primary care patients in many years nor will I in the future.   Larita FifeLynn, please cancel this appointment made with me in error.

## 2016-12-23 NOTE — Telephone Encounter (Signed)
Pt sill needs medication refilled. Pt has now been scheduled to see Leone PayorGessner. Will forward refill request.

## 2017-01-04 ENCOUNTER — Ambulatory Visit: Payer: 59 | Admitting: Family Medicine

## 2017-01-09 ENCOUNTER — Other Ambulatory Visit: Payer: Self-pay | Admitting: Family Medicine

## 2017-01-09 ENCOUNTER — Encounter: Payer: Self-pay | Admitting: Family Medicine

## 2017-01-09 ENCOUNTER — Ambulatory Visit: Payer: 59 | Admitting: Family Medicine

## 2017-01-09 VITALS — BP 114/76 | HR 83 | Temp 98.3°F | Ht 68.0 in | Wt 253.5 lb

## 2017-01-09 DIAGNOSIS — M25539 Pain in unspecified wrist: Secondary | ICD-10-CM | POA: Insufficient documentation

## 2017-01-09 DIAGNOSIS — Z6839 Body mass index (BMI) 39.0-39.9, adult: Secondary | ICD-10-CM

## 2017-01-09 DIAGNOSIS — Z7689 Persons encountering health services in other specified circumstances: Secondary | ICD-10-CM | POA: Diagnosis not present

## 2017-01-09 DIAGNOSIS — D649 Anemia, unspecified: Secondary | ICD-10-CM

## 2017-01-09 DIAGNOSIS — E6609 Other obesity due to excess calories: Secondary | ICD-10-CM | POA: Diagnosis not present

## 2017-01-09 DIAGNOSIS — Z13220 Encounter for screening for lipoid disorders: Secondary | ICD-10-CM

## 2017-01-09 DIAGNOSIS — Z1321 Encounter for screening for nutritional disorder: Secondary | ICD-10-CM

## 2017-01-09 DIAGNOSIS — G5600 Carpal tunnel syndrome, unspecified upper limb: Secondary | ICD-10-CM | POA: Insufficient documentation

## 2017-01-09 DIAGNOSIS — E66812 Obesity, class 2: Secondary | ICD-10-CM

## 2017-01-09 HISTORY — DX: Pain in unspecified wrist: M25.539

## 2017-01-09 LAB — CBC
HCT: 42.8 % (ref 36.0–46.0)
Hemoglobin: 14.3 g/dL (ref 12.0–15.0)
MCHC: 33.4 g/dL (ref 30.0–36.0)
MCV: 93 fl (ref 78.0–100.0)
PLATELETS: 191 10*3/uL (ref 150.0–400.0)
RBC: 4.6 Mil/uL (ref 3.87–5.11)
RDW: 12.8 % (ref 11.5–15.5)
WBC: 5.3 10*3/uL (ref 4.0–10.5)

## 2017-01-09 LAB — LIPID PANEL
Cholesterol: 134 mg/dL (ref 0–200)
HDL: 66 mg/dL (ref >39.00)
LDL CALC: 56 mg/dL (ref 0–99)
NONHDL: 67.71
Total CHOL/HDL Ratio: 2
Triglycerides: 58 mg/dL (ref 0.0–149.0)
VLDL: 11.6 mg/dL (ref 0.0–40.0)

## 2017-01-09 LAB — VITAMIN D 25 HYDROXY (VIT D DEFICIENCY, FRACTURES): VITD: 25.74 ng/mL — AB (ref 30.00–100.00)

## 2017-01-09 MED ORDER — MONTELUKAST SODIUM 10 MG PO TABS: 10.0000 mg | ORAL_TABLET | Freq: Every day | ORAL | 3 refills | Status: DC

## 2017-01-09 MED ORDER — ADVAIR DISKUS 100-50 MCG/DOSE IN AEPB: 1.0000 | INHALATION_SPRAY | Freq: Two times a day (BID) | RESPIRATORY_TRACT | 11 refills | Status: DC

## 2017-01-09 NOTE — Progress Notes (Signed)
Subjective:    Patient ID: Sonia SchmidtKimberly S Martinez, female    DOB: 01-Apr-1980, 36 y.o.   MRN: [id number]  HPI This is a 36 yo female who presents today to establish care. She is married with 34 yo, 34 yo. Works in Audiological scientistaccounting for [employer]  For fun she enjoys cooking and reading. Husband is a Emergency planning/management officerpolice officer.   Last CPE- Due 1/19 with gyn Mammo- NA Pap- sees gyn Tdap- unknown Flu- had at work Eye- reg Dental- reg Exercise- not regular  Asthma- has been sick recently with URI. Took prednisone and albuterol with good resolution. Usually well controlled, has occasional difficulties during allergy season.   Elevated LFTs- has had work up by GI (Dr. Loreta AveMann), LFTs had returned to normal 6/18.  Anemia following delivery, no recheck on chart, will check today for resolution.   Obesity- sleeps 6 hours a night, stress with balancing family life/work.  Past Medical History:  Diagnosis Date  . Asthma   . IBS (irritable bowel syndrome)   . Migraine without aura, without mention of intractable migraine without mention of status migrainosus 06/05/2013  . Obesity   . Oligohydramnios    2013  . Pre-eclampsia    2013  . Pregnancy induced hypertension    Past Surgical History:  Procedure Laterality Date  . SEPTOPLASTY    . WISDOM TOOTH EXTRACTION     Family History  Problem Relation Age of Onset  . Hypertension Mother   . Cancer - Other Mother        uterine  . Arthritis Mother   . Heart disease Maternal Grandfather   . Hyperlipidemia Maternal Grandfather   . Hypertension Maternal Grandfather   . Diabetes Paternal Grandmother   . Hyperlipidemia Paternal Grandmother   . Stroke Paternal Grandmother   . Hypertension Paternal Grandmother   . Lung cancer Paternal Grandfather   . Diabetes Paternal Grandfather   . Cancer - Other Paternal Grandfather        lung  . Hyperlipidemia Paternal Grandfather   . Hypertension Paternal Grandfather   . Hypertension Father   . Arthritis Maternal  Grandmother   . Hyperlipidemia Maternal Grandmother   . Stroke Maternal Grandmother   . Hypertension Maternal Grandmother   . Anesthesia problems Neg Hx   . Malignant hyperthermia Neg Hx   . Pseudochol deficiency Neg Hx   . Hypotension Neg Hx   . Migraines Neg Hx    Social History   Tobacco Use  . Smoking status: Never Smoker  . Smokeless tobacco: Never Used  Substance Use Topics  . Alcohol use: No  . Drug use: No      Review of Systems Per HPI    Objective:   Physical Exam    BP 114/76 (BP Location: Right Arm, Patient Position: Sitting, Cuff Size: Large)   Pulse 83   Temp 98.3 F (36.8 C) (Oral)   Wt 253 lb 8 oz (115 kg)   LMP 01/06/2017   SpO2 97%   BMI 39.12 kg/m  Wt Readings from Last 3 Encounters:  01/09/17 253 lb 8 oz (115 kg)  03/31/16 247 lb 3 oz (112.1 kg)  02/16/16 250 lb 9.6 oz (113.7 kg)       Assessment & Plan:  1. Encounter to establish care - Discussed and encouraged healthy lifestyle choices- adequate sleep, regular exercise, stress management and healthy food choices.   2. Anemia, unspecified type - CBC  3. Screening for lipid disorders - Lipid panel  4. Encounter  for vitamin deficiency screening - VITAMIN D 25 Hydroxy (Vit-D Deficiency, Fractures)  5. Class 2 obesity due to excess calories without serious comorbidity with body mass index (BMI) of 39.0 to 39.9 in adult - discussed role of stress, inadequate sleep on inability to lose weight. Encouraged her to work on these and increase activity as she can.   - follow up in 1 year as long as asthma well controlled, sooner if she has any problems  Olean Reeeborah Gessner, FNP-BC  [facility]  01/09/2017 8:50 AM

## 2017-01-09 NOTE — Patient Instructions (Addendum)
Look at [facility] on demand (available on [employer], too)  I will notify you of lab results via Mychart

## 2017-01-15 ENCOUNTER — Other Ambulatory Visit: Payer: Self-pay | Admitting: Family Medicine

## 2017-02-05 ENCOUNTER — Emergency Department: Payer: 59

## 2017-02-05 ENCOUNTER — Other Ambulatory Visit: Payer: Self-pay

## 2017-02-05 ENCOUNTER — Emergency Department
Admission: EM | Admit: 2017-02-05 | Discharge: 2017-02-05 | Disposition: A | Discharge status: Home / Self Care | Payer: 59 | Attending: Emergency Medicine | Admitting: Emergency Medicine

## 2017-02-05 ENCOUNTER — Encounter: Payer: Self-pay | Admitting: Emergency Medicine

## 2017-02-05 DIAGNOSIS — M549 Dorsalgia, unspecified: Secondary | ICD-10-CM | POA: Diagnosis not present

## 2017-02-05 DIAGNOSIS — R1010 Upper abdominal pain, unspecified: Secondary | ICD-10-CM | POA: Diagnosis present

## 2017-02-05 DIAGNOSIS — J45909 Unspecified asthma, uncomplicated: Secondary | ICD-10-CM | POA: Insufficient documentation

## 2017-02-05 DIAGNOSIS — R1011 Right upper quadrant pain: Secondary | ICD-10-CM

## 2017-02-05 DIAGNOSIS — R112 Nausea with vomiting, unspecified: Secondary | ICD-10-CM | POA: Diagnosis not present

## 2017-02-05 DIAGNOSIS — K8020 Calculus of gallbladder without cholecystitis without obstruction: Secondary | ICD-10-CM | POA: Diagnosis not present

## 2017-02-05 DIAGNOSIS — R110 Nausea: Secondary | ICD-10-CM

## 2017-02-05 LAB — COMPREHENSIVE METABOLIC PANEL
ALK PHOS: 99 U/L (ref 38–126)
ALT: 71 U/L — AB (ref 14–54)
ANION GAP: 7 (ref 5–15)
AST: 56 U/L — ABNORMAL HIGH (ref 15–41)
Albumin: 3.6 g/dL (ref 3.5–5.0)
BILIRUBIN TOTAL: 1.2 mg/dL (ref 0.3–1.2)
BUN: 10 mg/dL (ref 6–20)
CO2: 27 mmol/L (ref 22–32)
CREATININE: 0.73 mg/dL (ref 0.44–1.00)
Calcium: 8.7 mg/dL — ABNORMAL LOW (ref 8.9–10.3)
Chloride: 104 mmol/L (ref 101–111)
Glucose, Bld: 105 mg/dL — ABNORMAL HIGH (ref 65–99)
Potassium: 3.7 mmol/L (ref 3.5–5.1)
Sodium: 138 mmol/L (ref 135–145)
TOTAL PROTEIN: 7 g/dL (ref 6.5–8.1)

## 2017-02-05 LAB — URINALYSIS, COMPLETE (UACMP) WITH MICROSCOPIC
Bacteria, UA: NONE SEEN
Bilirubin Urine: NEGATIVE
GLUCOSE, UA: NEGATIVE mg/dL
HGB URINE DIPSTICK: NEGATIVE
KETONES UR: NEGATIVE mg/dL
Leukocytes, UA: NEGATIVE
Nitrite: NEGATIVE
PH: 7 (ref 5.0–8.0)
Protein, ur: NEGATIVE mg/dL
Specific Gravity, Urine: 1.017 (ref 1.005–1.030)
Squamous Epithelial / LPF: NONE SEEN
WBC UA: NONE SEEN WBC/hpf (ref 0–5)

## 2017-02-05 LAB — CBC
HCT: 40 % (ref 35.0–47.0)
HEMOGLOBIN: 13.5 g/dL (ref 12.0–16.0)
MCH: 31 pg (ref 26.0–34.0)
MCHC: 33.8 g/dL (ref 32.0–36.0)
MCV: 91.7 fL (ref 80.0–100.0)
PLATELETS: 203 10*3/uL (ref 150–440)
RBC: 4.37 MIL/uL (ref 3.80–5.20)
RDW: 13.7 % (ref 11.5–14.5)
WBC: 7.2 10*3/uL (ref 3.6–11.0)

## 2017-02-05 LAB — POCT PREGNANCY, URINE: Preg Test, Ur: NEGATIVE

## 2017-02-05 LAB — LIPASE, BLOOD: Lipase: 26 U/L (ref 11–51)

## 2017-02-05 MED ORDER — SODIUM CHLORIDE 0.9 % IV BOLUS (SEPSIS)
1000.0000 mL | Freq: Once | INTRAVENOUS | Status: AC
  Administered 2017-02-05: 1000 mL via INTRAVENOUS

## 2017-02-05 MED ORDER — IBUPROFEN 800 MG PO TABS: 800.0000 mg | ORAL_TABLET | Freq: Three times a day (TID) | ORAL | 0 refills | Status: DC | PRN

## 2017-02-05 MED ORDER — HYDROMORPHONE HCL 1 MG/ML IJ SOLN
0.5000 mg | Freq: Once | INTRAMUSCULAR | Status: AC
  Administered 2017-02-05: 0.5 mg via INTRAVENOUS
  Filled 2017-02-05: qty 1

## 2017-02-05 MED ORDER — ONDANSETRON 4 MG PO TBDP: 4.0000 mg | ORAL_TABLET | Freq: Three times a day (TID) | ORAL | 0 refills | Status: DC | PRN

## 2017-02-05 MED ORDER — ONDANSETRON HCL 4 MG/2ML IJ SOLN
4.0000 mg | Freq: Once | INTRAMUSCULAR | Status: AC
  Administered 2017-02-05: 4 mg via INTRAVENOUS
  Filled 2017-02-05: qty 2

## 2017-02-05 NOTE — ED Triage Notes (Signed)
First Nurse Note:  Arrives with C/O mid back and abdominal pain x 3 hours.  Symptoms accompanied by nausea.  Patient is AAOx3.  Skin warm and dry.

## 2017-02-05 NOTE — ED Notes (Signed)
Patient ambulatory to lobby with steady gait and NAD noted. Patient verbalized understanding of discharge instructions and follow-up care.  

## 2017-02-05 NOTE — ED Provider Notes (Signed)
[facility] Emergency Department Provider Note  ____________________________________________  Time seen: Approximately 4:12 PM  I have reviewed the triage vital signs and the nursing notes.   HISTORY  Chief Complaint Abdominal Pain    HPI Sonia Martinez is a 37 y.o. female , nonpregnant, with a history of gallbladder disease, preeclampsia 2013 and 2017, abnormal LFTs and known fatty liver disease, presenting with back and upper abdominal pain associated with nausea.  The patient reports that while having breakfast this morning she developed an epigastric and right and left upper abdominal discomfort.  While she was taking a shower, she developed mid thoracic back pain which radiated around to the abdomen.  Throughout the day, she is continued to have discomfort as well as nausea without vomiting.  She has not had any fevers, chills, cough or cold symptoms, urinary symptoms, change in vaginal discharge, constipation outside of her normal constipation or diarrhea.  She tried Aleve, heating pad, and over-the-counter antacids without any improvement.  Past Medical History:  Diagnosis Date  . Asthma   . IBS (irritable bowel syndrome)   . Migraine without aura, without mention of intractable migraine without mention of status migrainosus 06/05/2013  . Obesity   . Oligohydramnios    2013  . Pre-eclampsia    2013  . Pregnancy induced hypertension     Patient Active Problem List   Diagnosis Date Noted  . Carpal tunnel syndrome 01/09/2017  . Wrist joint pain 01/09/2017  . Folliculitis 03/31/2016  . Buttock pain 02/16/2016  . NSVD (normal spontaneous vaginal delivery) 01/06/2016  . Preeclampsia 01/05/2016  . Allergic rhinitis 09/22/2015  . Abnormal MSAFP (maternal serum alpha-fetoprotein), elevated 09/01/2015  . Migraine without aura, without mention of intractable migraine without mention of status migrainosus 06/05/2013  . Deviated nasal septum 02/18/2013   . Asthma 02/22/2010  . Vitamin D deficiency 11/04/2009  . Headache 07/16/2009  . Exercise induced bronchospasm 04/01/2009  . Herpes simplex virus (HSV) infection 03/23/2009    Past Surgical History:  Procedure Laterality Date  . SEPTOPLASTY    . WISDOM TOOTH EXTRACTION      Current Outpatient Rx  . Order #: [id number] Class: Normal  . Order #: [id number] Class: Normal  . Order #: [id number] Class: Normal  . Order #: [id number] Class: Print  . Order #: [id number] Class: Normal  . Order #: [id number] Class: Historical Med  . Order #: [id number] Class: Normal  . Order #: [id number] Class: Historical Med  . Order #: [id number] Class: Print  . Order #: [id number] Class: Historical Med  . Order #: [id number] Class: Historical Med    Allergies Ciprofloxacin; Coconut oil; Dulera [mometasone furo-formoterol fum]; and Hydrocortisone  Family History  Problem Relation Age of Onset  . Hypertension Mother   . Cancer - Other Mother        uterine  . Arthritis Mother   . Heart disease Maternal Grandfather   . Hyperlipidemia Maternal Grandfather   . Hypertension Maternal Grandfather   . Diabetes Paternal Grandmother   . Hyperlipidemia Paternal Grandmother   . Stroke Paternal Grandmother   . Hypertension Paternal Grandmother   . Lung cancer Paternal Grandfather   . Diabetes Paternal Grandfather   . Cancer - Other Paternal Grandfather        lung  . Hyperlipidemia Paternal Grandfather   . Hypertension Paternal Grandfather   . Hypertension Father   . Arthritis Maternal Grandmother   . Hyperlipidemia Maternal Grandmother   . Stroke Maternal Grandmother   . Hypertension Maternal Grandmother   . Anesthesia problems Neg  Hx   . Malignant hyperthermia Neg Hx   . Pseudochol deficiency Neg Hx   . Hypotension Neg Hx   . Migraines Neg Hx     Social History Social History   Tobacco Use  . Smoking status: Never Smoker  . Smokeless tobacco: Never Used  Substance Use Topics  . Alcohol use: No  . Drug  use: No    Review of Systems Constitutional: No fever/chills.  No lightheadedness or syncope.  No myalgias. Eyes: No visual changes. ENT: No sore throat. No congestion or rhinorrhea. Cardiovascular: Denies chest pain. Denies palpitations. Respiratory: Denies shortness of breath.  No cough. Gastrointestinal: Positive epigastric and right upper and left upper abdominal pain.  Positive nausea, no vomiting.  No diarrhea.  Positive chronic unchanged constipation. Genitourinary: Negative for dysuria..  No change in vaginal discharge. Musculoskeletal: Negative for midline back pain.  Positive for bilateral flank pain, right greater than left. Skin: Negative for rash. Neurological: Negative for headaches. No focal numbness, tingling or weakness.     ____________________________________________   PHYSICAL EXAM:  VITAL SIGNS: ED Triage Vitals  Enc Vitals Group     BP 02/05/17 1507 118/84     Pulse Rate 02/05/17 1507 72     Resp 02/05/17 1507 18     Temp 02/05/17 1507 98 F (36.7 C)     Temp Source 02/05/17 1507 Oral     SpO2 02/05/17 1507 99 %     Weight 02/05/17 1507 254 lb (115.2 kg)     Height 02/05/17 1507 5\' 7"  (1.702 m)     Head Circumference --      Peak Flow --      Pain Score 02/05/17 1506 8     Pain Loc --      Pain Edu? --      Excl. in GC? --     Constitutional: Alert and oriented. Morbidly obese.  Mildly uncomfortable appearing but in no acute distress. Answers questions appropriately. Eyes: Conjunctivae are normal.  EOMI. No scleral icterus. Head: Atraumatic. Nose: No congestion/rhinnorhea. Mouth/Throat: Mucous membranes are moist.  Neck: No stridor.  Supple.  No JVD.  No meningismus. Cardiovascular: Normal rate, regular rhythm. No murmurs, rubs or gallops.  Respiratory: Normal respiratory effort.  No accessory muscle use or retractions. Lungs CTAB.  No wheezes, rales or ronchi. Gastrointestinal: Morbidly obese.  Soft, and nondistended.  Tender to palpation  diffusely in the abdomen.  She does have the most tenderness in the right upper quadrant with a negative Murphy sign.  No guarding or rebound.  No peritoneal signs. Musculoskeletal: No LE edema.  Neurologic:  A&Ox3.  Speech is clear.  Face and smile are symmetric.  EOMI.  Moves all extremities well. Skin:  Skin is warm, dry and intact. No rash noted. Psychiatric: Mood and affect are normal. Speech and behavior are normal.  Normal judgement.  ____________________________________________   LABS (all labs ordered are listed, but only abnormal results are displayed)  Labs Reviewed  COMPREHENSIVE METABOLIC PANEL - Abnormal; Notable for the following components:      Result Value   Glucose, Bld 105 (*)    Calcium 8.7 (*)    AST 56 (*)    ALT 71 (*)    All other components within normal limits  URINALYSIS, COMPLETE (UACMP) WITH MICROSCOPIC - Abnormal; Notable for the following components:   Color, Urine YELLOW (*)    APPearance TURBID (*)    All other components within normal limits  LIPASE, BLOOD  CBC  POC URINE PREG, ED  POCT PREGNANCY, URINE   ____________________________________________  EKG  ED ECG REPORT I, Rockne Menghini, the attending physician, personally viewed and interpreted this ECG.   Date: 02/05/2017  EKG Time: 1512  Rate: 68  Rhythm: normal sinus rhythm  Axis: normal  Intervals:none  ST&T Change: No STEMI  ____________________________________________  RADIOLOGY  Dg Abdomen Acute W/chest  Result Date: 02/05/2017 CLINICAL DATA:  Right abdominal pain and left upper quadrant abdominal pain for the past 2-3 days. EXAM: DG ABDOMEN ACUTE W/ 1V CHEST COMPARISON:  Abdomen and pelvis CT dated 06/21/2007 and abdomen ultrasound dated 03/28/2016. FINDINGS: Normal sized heart. Clear lungs. Normal bowel gas pattern without free peritoneal air. Intrauterine device in expected location in the central pelvis. Unremarkable bones. IMPRESSION: No acute abnormality.  Electronically Signed   By: Beckie Salts M.D.   On: 02/05/2017 16:58   US Abdomen Limited Ruq  Result Date: 02/05/2017 CLINICAL DATA:  Right upper quadrant abdomen pain for 7 days. EXAM: ULTRASOUND ABDOMEN LIMITED RIGHT UPPER QUADRANT COMPARISON:  None. FINDINGS: Gallbladder: Gallstones are identified. No wall thickening visualized. No sonographic Murphy sign noted by sonographer. The sonographer reports the patient has been medicated. Common bile duct: Diameter: 3.4 mm Liver: No focal lesion identified. There is diffuse increased echotexture of the liver. Portal vein is patent on color Doppler imaging with normal direction of blood flow towards the liver. IMPRESSION: Gallstones are identified. No sonographic Eulah Pont sign is reported by the sonographer but the sonographer reports the patient has been medicated. Fatty infiltration of liver. Electronically Signed   By: Sherian Rein M.D.   On: 02/05/2017 17:27    ____________________________________________   PROCEDURES  Procedure(s) performed: None  Procedures  Critical Care performed: No ____________________________________________   INITIAL IMPRESSION / ASSESSMENT AND PLAN / ED COURSE  Pertinent labs & imaging results that were available during my care of the patient were reviewed by me and considered in my medical decision making (see chart for details).  37 y.o. female with a history of known gallbladder disease, abnormal LFTs in the setting of fatty liver, presenting with upper abdominal pain, back pain, nausea without vomiting.  Overall, the patient is hemodynamically stable and afebrile.  Her abdominal examination reveals diffuse tenderness although there is some focality in the right upper quadrant.  Today, the patient's laboratory studies show a normal white blood cell count, and an AST and ALT improved compared to baseline.  Her bilirubin today is normal at 1.2, and only mildly more elevated than her last bilirubin.  Her pregnancy  test is negative, and she does not have UTI.  We will get a right upper quadrant ultrasound to evaluate for gallbladder disease although acute cholecystitis is less likely.  ACS or MI is very unlikely.  Patient does have a history of chronic constipation and obstruction is possible although this likely, will get an abdominal x-ray for this.  In the meantime, we will initiate symptom medic treatment and reevaluate the patient for final disposition.  ----------------------------------------- 5:50 PM on 02/05/2017 -----------------------------------------  Clinically, the patient is feeling better at this time and is able to tolerate liquids without vomiting.  Her workup is consistent with cholelithiasis without cholecystitis or other complication.  At this time, I will plan to discharge the patient home with symptomatic treatment.  I have explicitly reminded her to avoid acetaminophen given her abnormal LFTs.  I have also given her the phone number for the surgeon on-call, Dr. Tonita Cong, for outpatient surgical follow-up.  She will also follow-up with her primary care physician.  At this time, the patient is safe for discharge home.  Return precautions were discussed. ____________________________________________  FINAL CLINICAL IMPRESSION(S) / ED DIAGNOSES  Final diagnoses:  Right upper quadrant pain  Upper abdominal pain  Acute bilateral back pain, unspecified back location  Nausea without vomiting  Gallstones         NEW MEDICATIONS STARTED DURING THIS VISIT:  New Prescriptions   IBUPROFEN (ADVIL,MOTRIN) 800 MG TABLET    Take 1 tablet (800 mg total) by mouth every 8 (eight) hours as needed for mild pain or moderate pain (with food).   ONDANSETRON (ZOFRAN ODT) 4 MG DISINTEGRATING TABLET    Take 1 tablet (4 mg total) by mouth every 8 (eight) hours as needed for nausea or vomiting.      Rockne Menghini, MD 02/05/17 1751

## 2017-02-05 NOTE — ED Triage Notes (Signed)
Here for upper abdominal and mid back pain. Feels like a pressure across whole upper abdomen. Does have gall stones and fatty liver.  Nausea but no vomiting or diarrhea. Noticed worsening fullness after eating breakfast. No fever.

## 2017-02-05 NOTE — Discharge Instructions (Signed)
Please follow the instructions for a bland diet.  Please make an appoint with Dr. Tonita CongWoodham, the general surgeon, to be re-evaluated for your gallstones.  Return to the emergency department if you develop severe pain, inability to keep down fluids, fever, or any other symptoms concerning to you.

## 2017-02-09 ENCOUNTER — Other Ambulatory Visit
Admission: RE | Admit: 2017-02-09 | Discharge: 2017-02-09 | Disposition: A | Discharge status: Home / Self Care | Payer: 59 | Source: Ambulatory Visit | Attending: Surgery | Admitting: Surgery

## 2017-02-09 ENCOUNTER — Ambulatory Visit (INDEPENDENT_AMBULATORY_CARE_PROVIDER_SITE_OTHER): Payer: 59 | Admitting: Surgery

## 2017-02-09 ENCOUNTER — Encounter: Payer: Self-pay | Admitting: Surgery

## 2017-02-09 ENCOUNTER — Telehealth: Payer: Self-pay

## 2017-02-09 VITALS — BP 140/83 | HR 89 | Temp 98.4°F | Ht 67.0 in | Wt 255.4 lb

## 2017-02-09 DIAGNOSIS — R1011 Right upper quadrant pain: Secondary | ICD-10-CM

## 2017-02-09 LAB — COMPREHENSIVE METABOLIC PANEL
ALBUMIN: 3.7 g/dL (ref 3.5–5.0)
ALK PHOS: 83 U/L (ref 38–126)
ALT: 43 U/L (ref 14–54)
AST: 32 U/L (ref 15–41)
Anion gap: 6 (ref 5–15)
BUN: 11 mg/dL (ref 6–20)
CO2: 28 mmol/L (ref 22–32)
Calcium: 8.8 mg/dL — ABNORMAL LOW (ref 8.9–10.3)
Chloride: 104 mmol/L (ref 101–111)
Creatinine, Ser: 0.78 mg/dL (ref 0.44–1.00)
GFR calc Af Amer: >60 mL/min (ref >60)
GFR calc non Af Amer: >60 mL/min (ref >60)
GLUCOSE: 98 mg/dL (ref 65–99)
POTASSIUM: 3.8 mmol/L (ref 3.5–5.1)
SODIUM: 138 mmol/L (ref 135–145)
Total Bilirubin: 1.3 mg/dL — ABNORMAL HIGH (ref 0.3–1.2)
Total Protein: 6.9 g/dL (ref 6.5–8.1)

## 2017-02-09 LAB — PROTIME-INR
INR: 0.93
Prothrombin Time: 12.4 seconds (ref 11.4–15.2)

## 2017-02-09 LAB — BILIRUBIN, DIRECT: Bilirubin, Direct: 0.2 mg/dL (ref 0.1–0.5)

## 2017-02-09 NOTE — Progress Notes (Signed)
Surgical Consultation  02/09/2017  Sonia Martinez is an 37 y.o. female.   Chief Complaint  Patient presents with  . New Patient (Initial Visit)    Hospital Follow up-Upper abdominal pain   HPI: Sonia Martinez its a 37 year old female seen in consultation at the request of Dr. Sharma Covert. Recently had a gallbladder attack about 4 days ago. She describes that her pain initially was located in the epigastric area and then went to the right upper quadrant and radiated to her back. There pain was severe sharp and intermittent. He went to the emergency room and had IV fluids and narcotics which relieved the pain. Pain is not really associated with meals but it happened about 2 hours after she had breakfast and Sunday. Of note she had a history of fatty liver and has been seen by GI at [facility] by Dr. Loreta Ave, she has had extensive workup and only conclusion was fairly liver. Spur of the workup in the emergency room included an percent of the right upper quadrant and a half personally reviewed showing evidence of fatty liver, no evidence of cirrhosis, gallstones, normal common bile duct. No evidence of cholecystitis. Her LFTs are mildly elevated and her alkaline phosphatase and total bilirubin are normal. I have personally reviewed her CMP is over the last 4 years and she consistently had had mild elevation of the AST and ALT as well alkaline phosphatase. She also had a CT scan 2009 showing normal liver and no evidence of any major intra-abdominal pathology. She is able to perform more than 4 Mets of activity without any shortness of breath or chest pain. No previous abdominal surgeries. He has a rate had an EGD and colonoscopy which were normal.  Past Medical History:  Diagnosis Date  . Asthma   . Folliculitis 03/31/2016  . Herpes simplex virus (HSV) infection 03/23/2009   Overview:  Herpes Simplex  10/1 IMO update  . IBS (irritable bowel syndrome)   . Migraine without aura, without mention of intractable  migraine without mention of status migrainosus 06/05/2013  . Obesity   . Oligohydramnios    2013  . Pre-eclampsia    2013  . Preeclampsia 01/05/2016  . Pregnancy induced hypertension   . Wrist joint pain 01/09/2017    Past Surgical History:  Procedure Laterality Date  . SEPTOPLASTY    . WISDOM TOOTH EXTRACTION      Family History  Problem Relation Age of Onset  . Hypertension Mother   . Cancer - Other Mother        uterine  . Arthritis Mother   . Heart disease Maternal Grandfather   . Hyperlipidemia Maternal Grandfather   . Hypertension Maternal Grandfather   . Diabetes Paternal Grandmother   . Hyperlipidemia Paternal Grandmother   . Stroke Paternal Grandmother   . Hypertension Paternal Grandmother   . Lung cancer Paternal Grandfather   . Diabetes Paternal Grandfather   . Cancer - Other Paternal Grandfather        lung  . Hyperlipidemia Paternal Grandfather   . Hypertension Paternal Grandfather   . Hypertension Father   . Arthritis Maternal Grandmother   . Hyperlipidemia Maternal Grandmother   . Stroke Maternal Grandmother   . Hypertension Maternal Grandmother   . Anesthesia problems Neg Hx   . Malignant hyperthermia Neg Hx   . Pseudochol deficiency Neg Hx   . Hypotension Neg Hx   . Migraines Neg Hx     Social History:  reports that  has never smoked. she has  never used smokeless tobacco. She reports that she does not drink alcohol or use drugs.  Allergies:  Allergies  Allergen Reactions  . Ciprofloxacin Hives  . Coconut Oil Hives  . Dulera [Mometasone Furo-Formoterol Fum]     Patient can only take advair  . Hydrocortisone Hives    Medications reviewed.   ROS Full ROS performed and is otherwise negative other than what is stated in the HPI   BP 140/83   Pulse 89   Temp 98.4 F (36.9 C) (Oral)   Ht 5\' 7"  (1.702 m)   Wt 115.8 kg (255 lb 6.4 oz)   LMP 02/05/2017   BMI 40.00 kg/m    Physical Exam  Constitutional: She is oriented to person,  place, and time and well-developed, well-nourished, and in no distress. No distress.  Eyes: Right eye exhibits no discharge. Left eye exhibits no discharge. No scleral icterus.  Neck: Normal range of motion. No JVD present. No tracheal deviation present. Thyromegaly present.  Cardiovascular: Normal rate and regular rhythm.  Pulmonary/Chest: Effort normal. No stridor. No respiratory distress. She exhibits no tenderness.  Abdominal: Soft. She exhibits no distension and no mass. There is tenderness. There is no rebound and no guarding.  Mild TTP RUQ, no peritonitis or murphy  Musculoskeletal: Normal range of motion.  Lymphadenopathy:    She has no cervical adenopathy.  Neurological: She is alert and oriented to person, place, and time. Gait normal. GCS score is 15.  Skin: Skin is warm and dry. She is not diaphoretic.  Psychiatric: Mood, memory, affect and judgment normal.  Nursing note and vitals reviewed.    Assessment/Plan: 37 year old obese female with classic biliary colic likely from gallstones. In addition to this patient does have a fatty liver with mild increase in the LFTs that has been present for the last several years and has had already been workup by GI. Discussed with the patient in detail about her disease process.  I recommended 2 options either to have her GI doctor again evaluate her for potential additional liver were Or proceeding to the is room for a laparoscopic cholecystectomy with intraoperative cholangiogram and liver biopsy at the same time. I do believe that her symptoms are coming from her gallstones and the LFTs elevations are due to to her fatty liver. She wishes to proceed with laparoscopic cholecystectomy. The risks, benefits, complications, treatment options, and expected outcomes were discussed with the patient. The possibilities of bleeding, recurrent infection, finding a normal gallbladder, perforation of viscus organs, damage to surrounding structures, bile  leak, abscess formation, needing a drain placed, the need for additional procedures, reaction to medication, pulmonary aspiration,  failure to diagnose a condition, the possible need to convert to an open procedure, and creating a complication requiring transfusion or operation were discussed with the patient. The patient and/or family concurred with the proposed plan, giving informed consent.  Copy of the report will be sent to the referring provider  1. Abdominal pain, right upper quadrant  - Comprehensive metabolic panel - Protime-INR - Bilirubin, fractionated(tot/dir/indir) - Hepatic function panel   Sterling Bigiego Jazir Newey, MD FACS General Surgeon

## 2017-02-09 NOTE — H&P (View-Only) (Signed)
Surgical Consultation  02/09/2017  Sonia Martinez is an 37 y.o. female.   Chief Complaint  Patient presents with  . New Patient (Initial Visit)    Hospital Follow up-Upper abdominal pain   HPI: Sonia Martinez its a 37-year-old female seen in consultation at the request of Dr. Norman. Recently had a gallbladder attack about 4 days ago. She describes that her pain initially was located in the epigastric area and then went to the right upper quadrant and radiated to her back. There pain was severe sharp and intermittent. He went to the emergency room and had IV fluids and narcotics which relieved the pain. Pain is not really associated with meals but it happened about 2 hours after she had breakfast and Sunday. Of note she had a history of fatty liver and has been seen by GI at [place] by Dr. Mann, she has had extensive workup and only conclusion was fairly liver. Spur of the workup in the emergency room included an percent of the right upper quadrant and a half personally reviewed showing evidence of fatty liver, no evidence of cirrhosis, gallstones, normal common bile duct. No evidence of cholecystitis. Her LFTs are mildly elevated and her alkaline phosphatase and total bilirubin are normal. I have personally reviewed her CMP is over the last 4 years and she consistently had had mild elevation of the AST and ALT as well alkaline phosphatase. She also had a CT scan 2009 showing normal liver and no evidence of any major intra-abdominal pathology. She is able to perform more than 4 Mets of activity without any shortness of breath or chest pain. No previous abdominal surgeries. He has a rate had an EGD and colonoscopy which were normal.  Past Medical History:  Diagnosis Date  . Asthma   . Folliculitis 03/31/2016  . Herpes simplex virus (HSV) infection 03/23/2009   Overview:  Herpes Simplex  10/1 IMO update  . IBS (irritable bowel syndrome)   . Migraine without aura, without mention of intractable  migraine without mention of status migrainosus 06/05/2013  . Obesity   . Oligohydramnios    2013  . Pre-eclampsia    2013  . Preeclampsia 01/05/2016  . Pregnancy induced hypertension   . Wrist joint pain 01/09/2017    Past Surgical History:  Procedure Laterality Date  . SEPTOPLASTY    . WISDOM TOOTH EXTRACTION      Family History  Problem Relation Age of Onset  . Hypertension Mother   . Cancer - Other Mother        uterine  . Arthritis Mother   . Heart disease Maternal Grandfather   . Hyperlipidemia Maternal Grandfather   . Hypertension Maternal Grandfather   . Diabetes Paternal Grandmother   . Hyperlipidemia Paternal Grandmother   . Stroke Paternal Grandmother   . Hypertension Paternal Grandmother   . Lung cancer Paternal Grandfather   . Diabetes Paternal Grandfather   . Cancer - Other Paternal Grandfather        lung  . Hyperlipidemia Paternal Grandfather   . Hypertension Paternal Grandfather   . Hypertension Father   . Arthritis Maternal Grandmother   . Hyperlipidemia Maternal Grandmother   . Stroke Maternal Grandmother   . Hypertension Maternal Grandmother   . Anesthesia problems Neg Hx   . Malignant hyperthermia Neg Hx   . Pseudochol deficiency Neg Hx   . Hypotension Neg Hx   . Migraines Neg Hx     Social History:  reports that  has never smoked. she has   never used smokeless tobacco. She reports that she does not drink alcohol or use drugs.  Allergies:  Allergies  Allergen Reactions  . Ciprofloxacin Hives  . Coconut Oil Hives  . Dulera [Mometasone Furo-Formoterol Fum]     Patient can only take advair  . Hydrocortisone Hives    Medications reviewed.   ROS Full ROS performed and is otherwise negative other than what is stated in the HPI   BP 140/83   Pulse 89   Temp 98.4 F (36.9 C) (Oral)   Ht 5' 7" (1.702 m)   Wt 115.8 kg (255 lb 6.4 oz)   LMP 02/05/2017   BMI 40.00 kg/m    Physical Exam  Constitutional: She is oriented to person,  place, and time and well-developed, well-nourished, and in no distress. No distress.  Eyes: Right eye exhibits no discharge. Left eye exhibits no discharge. No scleral icterus.  Neck: Normal range of motion. No JVD present. No tracheal deviation present. Thyromegaly present.  Cardiovascular: Normal rate and regular rhythm.  Pulmonary/Chest: Effort normal. No stridor. No respiratory distress. She exhibits no tenderness.  Abdominal: Soft. She exhibits no distension and no mass. There is tenderness. There is no rebound and no guarding.  Mild TTP RUQ, no peritonitis or murphy  Musculoskeletal: Normal range of motion.  Lymphadenopathy:    She has no cervical adenopathy.  Neurological: She is alert and oriented to person, place, and time. Gait normal. GCS score is 15.  Skin: Skin is warm and dry. She is not diaphoretic.  Psychiatric: Mood, memory, affect and judgment normal.  Nursing note and vitals reviewed.    Assessment/Plan: 37-year-old obese female with classic biliary colic likely from gallstones. In addition to this patient does have a fatty liver with mild increase in the LFTs that has been present for the last several years and has had already been workup by GI. Discussed with the patient in detail about her disease process.  I recommended 2 options either to have her GI doctor again evaluate her for potential additional liver were Or proceeding to the is room for a laparoscopic cholecystectomy with intraoperative cholangiogram and liver biopsy at the same time. I do believe that her symptoms are coming from her gallstones and the LFTs elevations are due to to her fatty liver. She wishes to proceed with laparoscopic cholecystectomy. The risks, benefits, complications, treatment options, and expected outcomes were discussed with the patient. The possibilities of bleeding, recurrent infection, finding a normal gallbladder, perforation of viscus organs, damage to surrounding structures, bile  leak, abscess formation, needing a drain placed, the need for additional procedures, reaction to medication, pulmonary aspiration,  failure to diagnose a condition, the possible need to convert to an open procedure, and creating a complication requiring transfusion or operation were discussed with the patient. The patient and/or family concurred with the proposed plan, giving informed consent.  Copy of the report will be sent to the referring provider  1. Abdominal pain, right upper quadrant  - Comprehensive metabolic panel - Protime-INR - Bilirubin, fractionated(tot/dir/indir) - Hepatic function panel   Alana Dayton, MD FACS General Surgeon  

## 2017-02-09 NOTE — Telephone Encounter (Signed)
Patient notified of normal lab results.

## 2017-02-09 NOTE — Patient Instructions (Addendum)
We need for you to go to the lab today. The Lab is located in the Medical mall at [facility].   We will send the Referral to Dr.Mann Probation officer ) office. Someone from their office will contact you to schedule an appointment. If you have not heard from anyone within 5-7 days please call our office so we can check on this for you.   Please call our office with any questions or concerns.   Please see your Blue-pre care sheet for surgery information.    You have requested to have your gallbladder removed. This will be done on 02/24/17 at [facility] with Dr. Everlene Farrier.  You will most likely be out of work 1-2 weeks for this surgery. You will return after your post-op appointment with a lifting restriction for approximately 4 more weeks.  You will be able to eat anything you would like to following surgery. But, start by eating a bland diet and advance this as tolerated. The Gallbladder diet is below, please go as closely by this diet as possible prior to surgery to avoid any further attacks.  Please see the (blue)pre-care form that you have been given today. If you have any questions, please call our office.  Laparoscopic Cholecystectomy Laparoscopic cholecystectomy is surgery to remove the gallbladder. The gallbladder is located in the upper right part of the abdomen, behind the liver. It is a storage sac for bile, which is produced in the liver. Bile aids in the digestion and absorption of fats. Cholecystectomy is often done for inflammation of the gallbladder (cholecystitis). This condition is usually caused by a buildup of gallstones (cholelithiasis) in the gallbladder. Gallstones can block the flow of bile, and that can result in inflammation and pain. In severe cases, emergency surgery may be required. If emergency surgery is not required, you will have time to prepare for the procedure. Laparoscopic surgery is an alternative to open surgery. Laparoscopic surgery has a shorter  recovery time. Your common bile duct may also need to be examined during the procedure. If stones are found in the common bile duct, they may be removed. LET [facility] CARE PROVIDER KNOW ABOUT:  Any allergies you have.  All medicines you are taking, including vitamins, herbs, eye drops, creams, and over-the-counter medicines.  Previous problems you or members of your family have had with the use of anesthetics.  Any blood disorders you have.  Previous surgeries you have had.    Any medical conditions you have. RISKS AND COMPLICATIONS Generally, this is a safe procedure. However, problems may occur, including:  Infection.  Bleeding.  Allergic reactions to medicines.  Damage to other structures or organs.  A stone remaining in the common bile duct.  A bile leak from the cyst duct that is clipped when your gallbladder is removed.  The need to convert to open surgery, which requires a larger incision in the abdomen. This may be necessary if your surgeon thinks that it is not safe to continue with a laparoscopic procedure. BEFORE THE PROCEDURE  Ask your health care provider about:  Changing or stopping your regular medicines. This is especially important if you are taking diabetes medicines or blood thinners.  Taking medicines such as aspirin and ibuprofen. These medicines can thin your blood. Do not take these medicines before your procedure if your health care provider instructs you not to.  Follow instructions from your health care provider about eating or drinking restrictions.  Let your health care provider know if you develop  a cold or an infection before surgery.  Plan to have someone take you home after the procedure.  Ask your health care provider how your surgical site will be marked or identified.  You may be given antibiotic medicine to help prevent infection. PROCEDURE  To reduce your risk of infection:  Your health care team will wash or sanitize their  hands.  Your skin will be washed with soap.  An IV tube may be inserted into one of your veins.  You will be given a medicine to make you fall asleep (general anesthetic).  A breathing tube will be placed in your mouth.  The surgeon will make several small cuts (incisions) in your abdomen.  A thin, lighted tube (laparoscope) that has a tiny camera on the end will be inserted through one of the small incisions. The camera on the laparoscope will send a picture to a TV screen (monitor) in the operating room. This will give the surgeon a good view inside your abdomen.  A gas will be pumped into your abdomen. This will expand your abdomen to give the surgeon more room to perform the surgery.  Other tools that are needed for the procedure will be inserted through the other incisions. The gallbladder will be removed through one of the incisions.  After your gallbladder has been removed, the incisions will be closed with stitches (sutures), staples, or skin glue.  Your incisions may be covered with a bandage (dressing). The procedure may vary among health care providers and hospitals. AFTER THE PROCEDURE  Your blood pressure, heart rate, breathing rate, and blood oxygen level will be monitored often until the medicines you were given have worn off.  You will be given medicines as needed to control your pain.   This information is not intended to replace advice given to you by your health care provider. Make sure you discuss any questions you have with your health care provider.   Document Released: 01/10/2005 Document Revised: 10/01/2014 Document Reviewed: 08/22/2012 Elsevier Interactive Patient Education 2016 Elsevier Inc.   Low-Fat Diet for Gallbladder Conditions A low-fat diet can be helpful if you have pancreatitis or a gallbladder condition. With these conditions, your pancreas and gallbladder have trouble digesting fats. A healthy eating plan with less fat will help rest your  pancreas and gallbladder and reduce your symptoms. WHAT DO I NEED TO KNOW ABOUT THIS DIET?  Eat a low-fat diet.  Reduce your fat intake to less than 20-30% of your total daily calories. This is less than 50-60 g of fat per day.  Remember that you need some fat in your diet. Ask your dietician what your daily goal should be.  Choose nonfat and low-fat healthy foods. Look for the words "nonfat," "low fat," or "fat free."  As a guide, look on the label and choose foods with less than 3 g of fat per serving. Eat only one serving.  Avoid alcohol.  Do not smoke. If you need help quitting, talk with your health care provider.  Eat small frequent meals instead of three large heavy meals. WHAT FOODS CAN I EAT? Grains Include healthy grains and starches such as potatoes, wheat bread, fiber-rich cereal, and brown rice. Choose whole grain options whenever possible. In adults, whole grains should account for 45-65% of your daily calories.  Fruits and Vegetables Eat plenty of fruits and vegetables. Fresh fruits and vegetables add fiber to your diet. Meats and Other Protein Sources Eat lean meat such as chicken and pork. Trim  any fat off of meat before cooking it. Eggs, fish, and beans are other sources of protein. In adults, these foods should account for 10-35% of your daily calories. Dairy Choose low-fat milk and dairy options. Dairy includes fat and protein, as well as calcium.  Fats and Oils Limit high-fat foods such as fried foods, sweets, baked goods, sugary drinks.  Other Creamy sauces and condiments, such as mayonnaise, can add extra fat. Think about whether or not you need to use them, or use smaller amounts or low fat options. WHAT FOODS ARE NOT RECOMMENDED?  High fat foods, such as:  [employer] CorporationBaked goods.  Ice cream.  [place] toast.  Sweet rolls.  Pizza.  Cheese bread.  Foods covered with batter, butter, creamy sauces, or cheese.  Fried foods.  Sugary drinks and  desserts.  Foods that cause gas or bloating   This information is not intended to replace advice given to you by your health care provider. Make sure you discuss any questions you have with your health care provider.   Document Released: 01/15/2013 Document Reviewed: 01/15/2013 Elsevier Interactive Patient Education [employer] Elsevier Inc.

## 2017-02-14 ENCOUNTER — Telehealth: Payer: Self-pay | Admitting: Surgery

## 2017-02-14 NOTE — Telephone Encounter (Signed)
Pt advised of pre op date/time and sx date. Sx: 02/24/17 with Dr Pabon--laparoscopic cholecystectomy with gram and liver biopsy. Pre op: 02/20/17 between 9-1:00-phone interview.   Patient has paid 282.80 with Flex Spending card for physician estimate of only CPT: [phone].  Patient made aware to call [phone], between 1-3:00pm the day before surgery, to find out what time to arrive.    Patient was also referred to Dr Kenna GilbertMann's office GI. Appointment has been scheduled for 03/14/17 @ 8:30am. Patient is aware of the date/time/location. Office phone number: [phone]. This office has Epic and can see all notes.

## 2017-02-20 ENCOUNTER — Other Ambulatory Visit: Payer: 59

## 2017-02-20 ENCOUNTER — Other Ambulatory Visit: Payer: Self-pay

## 2017-02-20 ENCOUNTER — Encounter
Admission: RE | Admit: 2017-02-20 | Discharge: 2017-02-20 | Disposition: A | Discharge status: Home / Self Care | Payer: 59 | Source: Ambulatory Visit | Attending: Surgery | Admitting: Surgery

## 2017-02-20 HISTORY — DX: Personal history of Methicillin resistant Staphylococcus aureus infection: Z86.14

## 2017-02-20 HISTORY — DX: Fatty (change of) liver, not elsewhere classified: K76.0

## 2017-02-20 HISTORY — DX: Gastro-esophageal reflux disease without esophagitis: K21.9

## 2017-02-20 NOTE — Patient Instructions (Signed)
Your procedure is scheduled on: 02-24-17 FRIDAY Report to Same Day Surgery 2nd floor medical mall [facility] Entrance-take elevator on left to 2nd floor.  Check in with surgery information desk.) To find out your arrival time please call [phone] between 1PM - 3PM on 02-23-17 THURSDAY  Remember: Instructions that are not followed completely may result in serious medical risk, up to and including death, or upon the discretion of your surgeon and anesthesiologist your surgery may need to be rescheduled.    _x___ 1. Do not eat food after midnight the night before your procedure. You may drink clear liquids up to 2 hours before you are scheduled to arrive at the hospital for your procedure.  Do not drink clear liquids within 2 hours of your scheduled arrival to the hospital.  Clear liquids include  --Water or Apple juice without pulp  --Clear carbohydrate beverage such as ClearFast or Gatorade  --Black Coffee or Clear Tea (No milk, no creamers, do not add anything to the coffee or Tea Type 1 and type 2 diabetics should only drink water.  No gum chewing or hard candies.     __x__ 2. No Alcohol for 24 hours before or after surgery.   __x__3. No Smoking for 24 prior to surgery.   ____  4. Bring all medications with you on the day of surgery if instructed.    __x__ 5. Notify your doctor if there is any change in your medical condition     (cold, fever, infections).     Do not wear jewelry, make-up, hairpins, clips or nail polish.  Do not wear lotions, powders, or perfumes. You may wear deodorant.  Do not shave 48 hours prior to surgery. Men may shave face and neck.  Do not bring valuables to the hospital.    [facility] is not responsible for any belongings or valuables.               Contacts, dentures or bridgework may not be worn into surgery.  Leave your suitcase in the car. After surgery it may be brought to your room.  For patients admitted to the hospital, discharge time is  determined by your treatment team.   Patients discharged the day of surgery will not be allowed to drive home.  You will need someone to drive you home and stay with you the night of your procedure.    Please read over the following fact sheets that you were given:   [facility] Preparing for Surgery and or MRSA Information   _x___ TAKE THE FOLLOWING MEDICATION THE MORNING OF SURGERY WITH A SMALL SIP OF WATER. These include:  1. ZANTAC  2. TAKE A ZANTAC AT HOME Thursday NIGHT BEFORE BED  3.  4.  5.  6.  ____Fleets enema or Magnesium Citrate as directed.   _x___ Use CHG Soap or sage wipes as directed on instruction sheet   _X___ Use inhalers on the day of surgery and bring to hospital day of surgery-USE ADVAIR AND ALBUTEROL INHALER AT HOME AND BRING ALBUTEROL INHALER TO HOSPITAL  ____ Stop Metformin and Janumet 2 days prior to surgery.    ____ Take 1/2 of usual insulin dose the night before surgery and none on the morning surgery.   _x___ Follow recommendations from Cardiologist, Pulmonologist or PCP regarding stopping Aspirin, Coumadin, Plavix ,Eliquis, Effient, or Pradaxa, and Pletal.  X____Stop Anti-inflammatories such as Advil, Aleve, IBUPOREN, Motrin, Naproxen, Naprosyn, Goodies powders or aspirin products NOW-OK to take Tylenol  _x___ Stop supplements until after surgery-STOP FISH OIL AND VITAMIN C NOW-MAY RESUME AFTER SURGERY   ____ Bring C-Pap to the hospital.

## 2017-02-22 ENCOUNTER — Encounter
Admission: RE | Admit: 2017-02-22 | Discharge: 2017-02-22 | Disposition: A | Discharge status: Home / Self Care | Payer: 59 | Source: Ambulatory Visit | Attending: Surgery | Admitting: Surgery

## 2017-02-22 DIAGNOSIS — K760 Fatty (change of) liver, not elsewhere classified: Secondary | ICD-10-CM | POA: Diagnosis not present

## 2017-02-22 DIAGNOSIS — J45909 Unspecified asthma, uncomplicated: Secondary | ICD-10-CM | POA: Diagnosis not present

## 2017-02-22 DIAGNOSIS — Z881 Allergy status to other antibiotic agents status: Secondary | ICD-10-CM | POA: Diagnosis not present

## 2017-02-22 DIAGNOSIS — K8010 Calculus of gallbladder with chronic cholecystitis without obstruction: Secondary | ICD-10-CM | POA: Diagnosis present

## 2017-02-22 DIAGNOSIS — E669 Obesity, unspecified: Secondary | ICD-10-CM | POA: Diagnosis not present

## 2017-02-22 DIAGNOSIS — Z6841 Body Mass Index (BMI) 40.0 and over, adult: Secondary | ICD-10-CM | POA: Diagnosis not present

## 2017-02-22 DIAGNOSIS — K589 Irritable bowel syndrome without diarrhea: Secondary | ICD-10-CM | POA: Diagnosis not present

## 2017-02-22 LAB — SURGICAL PCR SCREEN
MRSA, PCR: NEGATIVE
STAPHYLOCOCCUS AUREUS: NEGATIVE

## 2017-02-23 MED ORDER — CEFAZOLIN SODIUM-DEXTROSE 2-4 GM/100ML-% IV SOLN
2.0000 g | INTRAVENOUS | Status: AC
  Administered 2017-02-24: 2 g via INTRAVENOUS

## 2017-02-24 ENCOUNTER — Ambulatory Visit: Payer: 59 | Admitting: Anesthesiology

## 2017-02-24 ENCOUNTER — Encounter
Admission: RE | Disposition: A | Discharge status: Home / Self Care | Payer: Self-pay | Source: Ambulatory Visit | Attending: Surgery

## 2017-02-24 ENCOUNTER — Other Ambulatory Visit: Payer: Self-pay

## 2017-02-24 ENCOUNTER — Ambulatory Visit: Payer: 59

## 2017-02-24 ENCOUNTER — Ambulatory Visit
Admission: RE | Admit: 2017-02-24 | Discharge: 2017-02-24 | Disposition: A | Discharge status: Home / Self Care | Payer: 59 | Source: Ambulatory Visit | Attending: Surgery | Admitting: Surgery

## 2017-02-24 DIAGNOSIS — Z6841 Body Mass Index (BMI) 40.0 and over, adult: Secondary | ICD-10-CM | POA: Insufficient documentation

## 2017-02-24 DIAGNOSIS — K760 Fatty (change of) liver, not elsewhere classified: Secondary | ICD-10-CM

## 2017-02-24 DIAGNOSIS — Z419 Encounter for procedure for purposes other than remedying health state, unspecified: Secondary | ICD-10-CM

## 2017-02-24 DIAGNOSIS — K8010 Calculus of gallbladder with chronic cholecystitis without obstruction: Secondary | ICD-10-CM | POA: Diagnosis not present

## 2017-02-24 DIAGNOSIS — Z881 Allergy status to other antibiotic agents status: Secondary | ICD-10-CM | POA: Insufficient documentation

## 2017-02-24 DIAGNOSIS — E669 Obesity, unspecified: Secondary | ICD-10-CM | POA: Insufficient documentation

## 2017-02-24 DIAGNOSIS — J45909 Unspecified asthma, uncomplicated: Secondary | ICD-10-CM | POA: Insufficient documentation

## 2017-02-24 DIAGNOSIS — K589 Irritable bowel syndrome without diarrhea: Secondary | ICD-10-CM | POA: Insufficient documentation

## 2017-02-24 HISTORY — PX: LIVER BIOPSY: SHX301

## 2017-02-24 HISTORY — PX: CHOLECYSTECTOMY: SHX55

## 2017-02-24 LAB — POCT PREGNANCY, URINE: PREG TEST UR: NEGATIVE

## 2017-02-24 SURGERY — LAPAROSCOPIC CHOLECYSTECTOMY WITH INTRAOPERATIVE CHOLANGIOGRAM
Anesthesia: General | Wound class: Clean Contaminated

## 2017-02-24 MED ORDER — PROPOFOL 10 MG/ML IV BOLUS
INTRAVENOUS | Status: AC
  Filled 2017-02-24: qty 20

## 2017-02-24 MED ORDER — ONDANSETRON HCL 4 MG/2ML IJ SOLN
4.0000 mg | Freq: Once | INTRAMUSCULAR | Status: AC | PRN
  Administered 2017-02-24: 4 mg via INTRAVENOUS

## 2017-02-24 MED ORDER — HYDROCODONE-ACETAMINOPHEN 5-325 MG PO TABS
1.0000 | ORAL_TABLET | Freq: Four times a day (QID) | ORAL | Status: DC | PRN
  Administered 2017-02-24: 1 via ORAL

## 2017-02-24 MED ORDER — PROMETHAZINE HCL 25 MG/ML IJ SOLN: 12.5000 mg | Freq: Once | INTRAMUSCULAR | Status: DC

## 2017-02-24 MED ORDER — FENTANYL CITRATE (PF) 100 MCG/2ML IJ SOLN
INTRAMUSCULAR | Status: AC
  Administered 2017-02-24: 25 ug via INTRAVENOUS
  Filled 2017-02-24: qty 2

## 2017-02-24 MED ORDER — KETOROLAC TROMETHAMINE 30 MG/ML IJ SOLN
INTRAMUSCULAR | Status: DC | PRN
  Administered 2017-02-24: 30 mg via INTRAVENOUS

## 2017-02-24 MED ORDER — FENTANYL CITRATE (PF) 100 MCG/2ML IJ SOLN
INTRAMUSCULAR | Status: DC | PRN
  Administered 2017-02-24 (×4): 50 ug via INTRAVENOUS

## 2017-02-24 MED ORDER — SUGAMMADEX SODIUM 200 MG/2ML IV SOLN
INTRAVENOUS | Status: DC | PRN
  Administered 2017-02-24: 300 mg via INTRAVENOUS

## 2017-02-24 MED ORDER — ACETAMINOPHEN 10 MG/ML IV SOLN
INTRAVENOUS | Status: DC | PRN
  Administered 2017-02-24: 1000 mg via INTRAVENOUS

## 2017-02-24 MED ORDER — ONDANSETRON HCL 4 MG/2ML IJ SOLN
INTRAMUSCULAR | Status: AC
  Administered 2017-02-24: 4 mg via INTRAVENOUS
  Filled 2017-02-24: qty 2

## 2017-02-24 MED ORDER — CHLORHEXIDINE GLUCONATE CLOTH 2 % EX PADS: 6.0000 | MEDICATED_PAD | Freq: Once | CUTANEOUS | Status: DC

## 2017-02-24 MED ORDER — MIDAZOLAM HCL 2 MG/2ML IJ SOLN
INTRAMUSCULAR | Status: AC
  Filled 2017-02-24: qty 2

## 2017-02-24 MED ORDER — FENTANYL CITRATE (PF) 100 MCG/2ML IJ SOLN
25.0000 ug | INTRAMUSCULAR | Status: AC | PRN
  Administered 2017-02-24 (×6): 25 ug via INTRAVENOUS

## 2017-02-24 MED ORDER — HYDROCODONE-ACETAMINOPHEN 5-325 MG PO TABS: 1.0000 | ORAL_TABLET | Freq: Four times a day (QID) | ORAL | 0 refills | Status: DC | PRN

## 2017-02-24 MED ORDER — ACETAMINOPHEN 10 MG/ML IV SOLN
INTRAVENOUS | Status: AC
  Filled 2017-02-24: qty 100

## 2017-02-24 MED ORDER — DEXAMETHASONE SODIUM PHOSPHATE 10 MG/ML IJ SOLN
INTRAMUSCULAR | Status: DC | PRN
  Administered 2017-02-24: 5 mg via INTRAVENOUS

## 2017-02-24 MED ORDER — FENTANYL CITRATE (PF) 100 MCG/2ML IJ SOLN
INTRAMUSCULAR | Status: AC
  Filled 2017-02-24: qty 2

## 2017-02-24 MED ORDER — BUPIVACAINE-EPINEPHRINE (PF) 0.25% -1:200000 IJ SOLN
INTRAMUSCULAR | Status: AC
Start: 2017-02-24 — End: 2017-02-24
  Filled 2017-02-24: qty 30

## 2017-02-24 MED ORDER — CEFAZOLIN SODIUM-DEXTROSE 2-4 GM/100ML-% IV SOLN
INTRAVENOUS | Status: AC
  Filled 2017-02-24: qty 100

## 2017-02-24 MED ORDER — BUPIVACAINE LIPOSOME 1.3 % IJ SUSP
INTRAMUSCULAR | Status: AC
  Filled 2017-02-24: qty 20

## 2017-02-24 MED ORDER — LACTATED RINGERS IV SOLN
INTRAVENOUS | Status: DC
  Administered 2017-02-24 (×2): via INTRAVENOUS

## 2017-02-24 MED ORDER — ROCURONIUM BROMIDE 100 MG/10ML IV SOLN
INTRAVENOUS | Status: DC | PRN
  Administered 2017-02-24: 20 mg via INTRAVENOUS
  Administered 2017-02-24: 10 mg via INTRAVENOUS
  Administered 2017-02-24: 40 mg via INTRAVENOUS

## 2017-02-24 MED ORDER — ONDANSETRON HCL 4 MG/2ML IJ SOLN
INTRAMUSCULAR | Status: DC | PRN
  Administered 2017-02-24: 4 mg via INTRAVENOUS

## 2017-02-24 MED ORDER — PROPOFOL 10 MG/ML IV BOLUS
INTRAVENOUS | Status: DC | PRN
  Administered 2017-02-24: 150 mg via INTRAVENOUS
  Administered 2017-02-24: 50 mg via INTRAVENOUS

## 2017-02-24 MED ORDER — HYDROCODONE-ACETAMINOPHEN 5-325 MG PO TABS
ORAL_TABLET | ORAL | Status: AC
  Filled 2017-02-24: qty 1

## 2017-02-24 MED ORDER — LIDOCAINE HCL (CARDIAC) 20 MG/ML IV SOLN
INTRAVENOUS | Status: DC | PRN
  Administered 2017-02-24: 100 mg via INTRAVENOUS

## 2017-02-24 MED ORDER — MIDAZOLAM HCL 2 MG/2ML IJ SOLN
INTRAMUSCULAR | Status: DC | PRN
  Administered 2017-02-24: 2 mg via INTRAVENOUS

## 2017-02-24 MED ORDER — SODIUM CHLORIDE 0.9 % IJ SOLN
INTRAMUSCULAR | Status: AC
  Filled 2017-02-24: qty 50

## 2017-02-24 SURGICAL SUPPLY — 77 items
APPLICATOR COTTON TIP 6IN STRL (MISCELLANEOUS) ×2 IMPLANT
APPLIER CLIP 5 13 M/L LIGAMAX5 (MISCELLANEOUS)
BLADE SURG 15 STRL LF DISP TIS (BLADE) ×1 IMPLANT
BLADE SURG 15 STRL SS (BLADE) ×1
BULB RESERV EVAC DRAIN JP 100C (MISCELLANEOUS) IMPLANT
CANISTER SUCT 1200ML W/VALVE (MISCELLANEOUS) ×2 IMPLANT
CATH REDDICK CHOLANGI 4FR 50CM (CATHETERS) IMPLANT
CHLORAPREP W/TINT 26ML (MISCELLANEOUS) ×2 IMPLANT
CHOLANGIOGRAM CATH TAUT (CATHETERS) IMPLANT
CLEANER CAUTERY TIP 5X5 PAD (MISCELLANEOUS) IMPLANT
CLIP APPLIE 5 13 M/L LIGAMAX5 (MISCELLANEOUS) IMPLANT
CLIP VESOCCLUDE LG 6/CT (CLIP) IMPLANT
CLIP VESOCCLUDE MED 6/CT (CLIP) IMPLANT
CONRAY 60ML FOR OR (MISCELLANEOUS) ×2 IMPLANT
DECANTER SPIKE VIAL GLASS SM (MISCELLANEOUS) IMPLANT
DERMABOND ADVANCED (GAUZE/BANDAGES/DRESSINGS) ×1
DERMABOND ADVANCED .7 DNX12 (GAUZE/BANDAGES/DRESSINGS) ×1 IMPLANT
DRAIN CHANNEL JP 19F (MISCELLANEOUS) IMPLANT
DRAPE C-ARM XRAY 36X54 (DRAPES) ×2 IMPLANT
DRAPE LAPAROTOMY TRNSV 106X77 (MISCELLANEOUS) ×2 IMPLANT
DRSG TELFA 3X8 NADH (GAUZE/BANDAGES/DRESSINGS) ×2 IMPLANT
ELECT BLADE 6.5 EXT (BLADE) IMPLANT
ELECT CAUTERY BLADE 6.4 (BLADE) IMPLANT
ELECT REM PT RETURN 9FT ADLT (ELECTROSURGICAL) ×2
ELECTRODE REM PT RTRN 9FT ADLT (ELECTROSURGICAL) ×1 IMPLANT
GAUZE SPONGE 4X4 12PLY STRL (GAUZE/BANDAGES/DRESSINGS) ×2 IMPLANT
GLOVE BIO SURGEON STRL SZ7 (GLOVE) ×2 IMPLANT
GLOVE BIOGEL PI IND STRL 7.0 (GLOVE) ×6 IMPLANT
GLOVE BIOGEL PI INDICATOR 7.0 (GLOVE) ×6
GOWN STRL REUS W/ TWL LRG LVL3 (GOWN DISPOSABLE) ×4 IMPLANT
GOWN STRL REUS W/TWL LRG LVL3 (GOWN DISPOSABLE) ×4
HEMOSTAT SURGICEL 2X14 (HEMOSTASIS) IMPLANT
HOLDER FOLEY CATH W/STRAP (MISCELLANEOUS) IMPLANT
IRRIGATION STRYKERFLOW (MISCELLANEOUS) ×1 IMPLANT
IRRIGATOR STRYKERFLOW (MISCELLANEOUS) ×2
IV CATH ANGIO 12GX3 LT BLUE (NEEDLE) ×2 IMPLANT
IV NS 1000ML (IV SOLUTION) ×1
IV NS 1000ML BAXH (IV SOLUTION) ×1 IMPLANT
KIT TURNOVER KIT A (KITS) ×2 IMPLANT
L-HOOK LAP DISP 36CM (ELECTROSURGICAL) ×2
LABEL OR SOLS (LABEL) IMPLANT
LHOOK LAP DISP 36CM (ELECTROSURGICAL) ×1 IMPLANT
NEEDLE HYPO 22GX1.5 SAFETY (NEEDLE) ×2 IMPLANT
NS IRRIG 1000ML POUR BTL (IV SOLUTION) ×2 IMPLANT
PACK BASIN MAJOR ARMC (MISCELLANEOUS) ×2 IMPLANT
PACK LAP CHOLECYSTECTOMY (MISCELLANEOUS) ×2 IMPLANT
PAD CLEANER CAUTERY TIP 5X5 (MISCELLANEOUS)
PENCIL ELECTRO HAND CTR (MISCELLANEOUS) ×2 IMPLANT
POUCH SPECIMEN RETRIEVAL 10MM (ENDOMECHANICALS) ×2 IMPLANT
SCISSORS METZENBAUM CVD 33 (INSTRUMENTS) ×2 IMPLANT
SLEEVE ENDOPATH XCEL 5M (ENDOMECHANICALS) ×4 IMPLANT
SOL ANTI-FOG 6CC FOG-OUT (MISCELLANEOUS) ×1 IMPLANT
SOL FOG-OUT ANTI-FOG 6CC (MISCELLANEOUS) ×1
SPONGE KITTNER 5P (MISCELLANEOUS) IMPLANT
SPONGE LAP 18X18 5 PK (GAUZE/BANDAGES/DRESSINGS) ×2 IMPLANT
STAPLER SKIN PROX 35W (STAPLE) ×2 IMPLANT
STOPCOCK 4 WAY LG BORE MALE ST (IV SETS) IMPLANT
SUT ETHIBOND 0 MO6 C/R (SUTURE) IMPLANT
SUT ETHILON 3-0 FS-10 30 BLK (SUTURE)
SUT MNCRL AB 4-0 PS2 18 (SUTURE) ×2 IMPLANT
SUT PDS PLUS 0 (SUTURE) ×2
SUT PDS PLUS AB 0 CT-2 (SUTURE) ×2 IMPLANT
SUT SILK 2 0 (SUTURE)
SUT SILK 2 0SH CR/8 30 (SUTURE) IMPLANT
SUT SILK 2-0 18XBRD TIE 12 (SUTURE) IMPLANT
SUT SILK 3 0 (SUTURE)
SUT SILK 3-0 18XBRD TIE 12 (SUTURE) IMPLANT
SUT VIC AB 2-0 CT2 27 (SUTURE) IMPLANT
SUT VICRYL 0 AB UR-6 (SUTURE) ×4 IMPLANT
SUTURE EHLN 3-0 FS-10 30 BLK (SUTURE) IMPLANT
SYR 20CC LL (SYRINGE) IMPLANT
TRAY FOLEY CATH SILVER 16FR LF (SET/KITS/TRAYS/PACK) IMPLANT
TROCAR XCEL BLUNT TIP 100MML (ENDOMECHANICALS) ×2 IMPLANT
TROCAR XCEL NON-BLD 5MMX100MML (ENDOMECHANICALS) ×2 IMPLANT
TUBING INSUF HEATED (TUBING) ×2 IMPLANT
TUBING INSUFFLATION (TUBING) ×2 IMPLANT
WATER STERILE IRR 1000ML POUR (IV SOLUTION) IMPLANT

## 2017-02-24 NOTE — Transfer of Care (Signed)
Immediate Anesthesia Transfer of Care Note  Patient: Gilman SchmidtKimberly S Mcclory  Procedure(s) Performed: LAPAROSCOPIC CHOLECYSTECTOMY (N/A ) LIVER BIOPSY (N/A )  Patient Location: PACU  Anesthesia Type:General  Level of Consciousness: awake, alert , oriented and patient cooperative  Airway & Oxygen Therapy: Patient Spontanous Breathing and Patient connected to face mask oxygen  Post-op Assessment: Report given to RN, Post -op Vital signs reviewed and stable and Patient moving all extremities  Post vital signs: Reviewed and stable  Last Vitals:  Vitals:   02/24/17 1358 02/24/17 1400  BP: 112/77   Pulse: 80   Resp: 16   Temp: 36.4 C 36.4 C  SpO2: 100%     Last Pain:  Vitals:   02/24/17 1358  TempSrc:   PainSc: 10-Worst pain ever         Complications: No apparent anesthesia complications

## 2017-02-24 NOTE — Discharge Instructions (Signed)

## 2017-02-24 NOTE — Op Note (Signed)
Laparoscopic Cholecystectomy  Pre-operative Diagnosis: Gallstones  Post-operative Diagnosis: same  Procedure: lap cholecystectomy w wedge  liver biopsy  Surgeon: Sterling Big, MD FACS  Anesthesia: Gen. with endotracheal tube   Findings: Fatty liver w/o cirrhosis GS Diminutive cystic duct, unable to perform cholangiogram  Estimated Blood Loss: 20cc         Drains: none         Specimens: Gallbladder           Complications: none   Procedure Details  The patient was seen again in the Holding Room. The benefits, complications, treatment options, and expected outcomes were discussed with the patient. The risks of bleeding, infection, recurrence of symptoms, failure to resolve symptoms, bile duct damage, bile duct leak, retained common bile duct stone, bowel injury, any of which could require further surgery and/or ERCP, stent, or papillotomy were reviewed with the patient. The likelihood of improving the patient's symptoms with return to their baseline status is good.  The patient and/or family concurred with the proposed plan, giving informed consent.  The patient was taken to Operating Room, identified as Sonia Martinez and the procedure verified as Laparoscopic Cholecystectomy.  A Time Out was held and the above information confirmed.  Prior to the induction of general anesthesia, antibiotic prophylaxis was administered. VTE prophylaxis was in place. General endotracheal anesthesia was then administered and tolerated well. After the induction, the abdomen was prepped with Chloraprep and draped in the sterile fashion. The patient was positioned in the supine position.  Cut down technique was used to enter the abdominal cavity and a Hasson trochar was placed after two vicryl stitches were anchored to the fascia. Pneumoperitoneum was then created with CO2 and tolerated well without any adverse changes in the patient's vital signs.  Three 5-mm ports were placed in the right upper  quadrant all under direct vision. All skin incisions  were infiltrated with a local anesthetic agent before making the incision and placing the trocars.   The patient was positioned  in reverse Trendelenburg, tilted slightly to the patient's left.  The gallbladder was identified, the fundus grasped and retracted cephalad. Adhesions were lysed bluntly. The infundibulum was grasped and retracted laterally, exposing the peritoneum overlying the triangle of Calot. This was then divided and exposed in a blunt fashion. An extended critical view of the cystic duct and cystic artery was obtained.  The cystic duct was clearly identified and bluntly dissected.    Incision created in the cystic duct but the lumen was narrow and sclerotic, we were not able to perform gram.  Artery and duct were double clipped and divided.  The gallbladder was taken from the gallbladder fossa in a retrograde fashion with the electrocautery. The gallbladder was removed and placed in an Endocatch bag. The liver bed was irrigated and inspected.  Wedge liver biopsy performed with electrocautery in the standard fashion.  Hemostasis was achieved with the electrocautery. Copious irrigation was utilized and was repeatedly aspirated until clear.  The gallbladder and Endocatch sac were then removed through a port site.    Inspection of the right upper quadrant was performed. No bleeding, bile duct injury or leak, or bowel injury was noted. Pneumoperitoneum was released.  The periumbilical port site was closed with interrumpted 0 Vicryl sutures. 4-0 subcuticular Monocryl was used to close the skin. Dermabond was  applied.  The patient was then extubated and brought to the recovery room in stable condition. Sponge, lap, and needle counts were correct at closure and  at the conclusion of the case.               Sterling Bigiego Pabon, MD, FACS

## 2017-02-24 NOTE — Interval H&P Note (Signed)
History and Physical Interval Note:  02/24/2017 11:31 AM  Sonia Martinez  has presented today for surgery, with the diagnosis of biliary colic  The various methods of treatment have been discussed with the patient and family. After consideration of risks, benefits and other options for treatment, the patient has consented to  Procedure(s) with comments: LAPAROSCOPIC CHOLECYSTECTOMY WITH INTRAOPERATIVE CHOLANGIOGRAM (N/A) - IOC LIVER BIOPSY (N/A) CHOLECYSTECTOMY (N/A) as a surgical intervention .  The patient's history has been reviewed, patient examined, no change in status, stable for surgery.  I have reviewed the patient's chart and labs.  Questions were answered to the patient's satisfaction.     Kaelyn Innocent F Faithanne Verret

## 2017-02-24 NOTE — Anesthesia Procedure Notes (Signed)
Procedure Name: Intubation Date/Time: 02/24/2017 12:26 PM Performed by: Lowry Bowl, CRNA Pre-anesthesia Checklist: Patient identified, Emergency Drugs available, Suction available and Patient being monitored Patient Re-evaluated:Patient Re-evaluated prior to induction Oxygen Delivery Method: Circle system utilized Preoxygenation: Pre-oxygenation with 100% oxygen Induction Type: IV induction Ventilation: Mask ventilation without difficulty Laryngoscope Size: Mac and 3 Grade View: Grade II Tube type: Oral Tube size: 7.0 mm Number of attempts: 1 Airway Equipment and Method: Stylet Placement Confirmation: ETT inserted through vocal cords under direct vision,  positive ETCO2 and breath sounds checked- equal and bilateral Secured at: 21 cm Tube secured with: Tape Dental Injury: Teeth and Oropharynx as per pre-operative assessment

## 2017-02-24 NOTE — Anesthesia Preprocedure Evaluation (Signed)
Anesthesia Evaluation  Patient identified by MRN, date of birth, ID band Patient awake    Reviewed: Allergy & Precautions, NPO status , Patient's Chart, lab work & pertinent test results, reviewed documented beta blocker date and time   Airway Mallampati: III  TM Distance: >3 FB     Dental  (+) Chipped   Pulmonary asthma ,           Cardiovascular hypertension,      Neuro/Psych  Headaches,  Neuromuscular disease    GI/Hepatic GERD  Controlled,  Endo/Other    Renal/GU      Musculoskeletal   Abdominal   Peds  Hematology   Anesthesia Other Findings   Reproductive/Obstetrics                             Anesthesia Physical Anesthesia Plan  ASA: III  Anesthesia Plan: General   Post-op Pain Management:    Induction: Intravenous  PONV Risk Score and Plan:   Airway Management Planned: Oral ETT  Additional Equipment:   Intra-op Plan:   Post-operative Plan:   Informed Consent: I have reviewed the patients History and Physical, chart, labs and discussed the procedure including the risks, benefits and alternatives for the proposed anesthesia with the patient or authorized representative who has indicated his/her understanding and acceptance.     Plan Discussed with: CRNA  Anesthesia Plan Comments:         Anesthesia Quick Evaluation

## 2017-02-24 NOTE — Anesthesia Post-op Follow-up Note (Signed)
Anesthesia QCDR form completed.        

## 2017-02-27 ENCOUNTER — Encounter: Payer: Self-pay | Admitting: Surgery

## 2017-02-27 ENCOUNTER — Telehealth: Payer: Self-pay | Admitting: Surgery

## 2017-02-27 NOTE — Anesthesia Postprocedure Evaluation (Signed)
Anesthesia Post Note  Patient: Sonia Martinez  Procedure(s) Performed: LAPAROSCOPIC CHOLECYSTECTOMY (N/A ) LIVER BIOPSY (N/A )  Patient location during evaluation: PACU Anesthesia Type: General Level of consciousness: awake and alert and oriented Pain management: pain level controlled Vital Signs Assessment: post-procedure vital signs reviewed and stable Respiratory status: spontaneous breathing, nonlabored ventilation and respiratory function stable Cardiovascular status: blood pressure returned to baseline and stable Postop Assessment: no signs of nausea or vomiting Anesthetic complications: no     Last Vitals:  Vitals:   02/24/17 1513 02/24/17 1652  BP: 125/71 131/83  Pulse: 77 72  Resp: 16 16  Temp: (!) 36.2 C   SpO2: 96% 100%    Last Pain:  Vitals:   02/24/17 1600  TempSrc:   PainSc: 4                  Arbadella Kimbler

## 2017-02-27 NOTE — Telephone Encounter (Signed)
Patient's calling asking how would she go about getting a doctors note to return to work, patient isn't due to come into the office until 2/20. Please call patient and advise.

## 2017-02-27 NOTE — Telephone Encounter (Signed)
Patient wishes to return to work on 03/06/17 and would like work note. We discussed no lifting restrictions and she stated she is feeling ok, no problems at this time.   Note will be placed at front desk for patient to pick up later today.

## 2017-03-01 ENCOUNTER — Telehealth: Payer: Self-pay

## 2017-03-01 ENCOUNTER — Other Ambulatory Visit
Admission: RE | Admit: 2017-03-01 | Discharge: 2017-03-01 | Disposition: A | Discharge status: Home / Self Care | Payer: 59 | Source: Ambulatory Visit | Attending: Surgery | Admitting: Surgery

## 2017-03-01 DIAGNOSIS — R109 Unspecified abdominal pain: Secondary | ICD-10-CM | POA: Diagnosis present

## 2017-03-01 LAB — COMPREHENSIVE METABOLIC PANEL
ALBUMIN: 3.3 g/dL — AB (ref 3.5–5.0)
ALT: 98 U/L — ABNORMAL HIGH (ref 14–54)
ANION GAP: 7 (ref 5–15)
AST: 51 U/L — ABNORMAL HIGH (ref 15–41)
Alkaline Phosphatase: 103 U/L (ref 38–126)
BILIRUBIN TOTAL: 1.2 mg/dL (ref 0.3–1.2)
BUN: 6 mg/dL (ref 6–20)
CHLORIDE: 106 mmol/L (ref 101–111)
CO2: 26 mmol/L (ref 22–32)
Calcium: 9 mg/dL (ref 8.9–10.3)
Creatinine, Ser: 0.82 mg/dL (ref 0.44–1.00)
GFR calc Af Amer: >60 mL/min (ref >60)
GFR calc non Af Amer: >60 mL/min (ref >60)
GLUCOSE: 96 mg/dL (ref 65–99)
POTASSIUM: 4.1 mmol/L (ref 3.5–5.1)
Sodium: 139 mmol/L (ref 135–145)
TOTAL PROTEIN: 7.1 g/dL (ref 6.5–8.1)

## 2017-03-01 LAB — CBC WITH DIFFERENTIAL/PLATELET
BASOS PCT: 0 %
Basophils Absolute: 0 10*3/uL (ref 0–0.1)
EOS PCT: 3 %
Eosinophils Absolute: 0.2 10*3/uL (ref 0–0.7)
HEMATOCRIT: 41.2 % (ref 35.0–47.0)
Hemoglobin: 13.9 g/dL (ref 12.0–16.0)
Lymphocytes Relative: 22 %
Lymphs Abs: 1.7 10*3/uL (ref 1.0–3.6)
MCH: 30.9 pg (ref 26.0–34.0)
MCHC: 33.8 g/dL (ref 32.0–36.0)
MCV: 91.2 fL (ref 80.0–100.0)
MONO ABS: 0.4 10*3/uL (ref 0.2–0.9)
MONOS PCT: 5 %
NEUTROS ABS: 5.6 10*3/uL (ref 1.4–6.5)
Neutrophils Relative %: 70 %
PLATELETS: 227 10*3/uL (ref 150–440)
RBC: 4.52 MIL/uL (ref 3.80–5.20)
RDW: 13.3 % (ref 11.5–14.5)
WBC: 8 10*3/uL (ref 3.6–11.0)

## 2017-03-01 LAB — SURGICAL PATHOLOGY

## 2017-03-01 NOTE — Telephone Encounter (Signed)
Normal labs today.  Per Dr. Excell Seltzerooper patient was instructed to have lab work done prior to post op visit 03/15/17.  Patient was instructed to Take Tylenol or Ibuprofen for pain and discomfort. If she develops fever, nausea or vomiting or pain worsens to call office or go to the emergency room.

## 2017-03-01 NOTE — Telephone Encounter (Signed)
Spoke with DR.Cooper. Per Dr.Cooper patient instructed to come to [facility] for blood work CMP,CBC.

## 2017-03-01 NOTE — Telephone Encounter (Signed)
Patient states that she started having right side pain yesterday. The pain is constant. When she takes a deep breath it hurts. Took gas x yesterday and pass gas, but it has not eased up. The pain is constant. Her bowels are moving, some diarrhea. Appetite is ok, watching what she eats. Denies fever,chills, nausea or vomiting.

## 2017-03-02 ENCOUNTER — Encounter: Payer: Self-pay | Admitting: Surgery

## 2017-03-02 ENCOUNTER — Ambulatory Visit (INDEPENDENT_AMBULATORY_CARE_PROVIDER_SITE_OTHER): Payer: 59 | Admitting: Surgery

## 2017-03-02 ENCOUNTER — Telehealth: Payer: Self-pay

## 2017-03-02 ENCOUNTER — Other Ambulatory Visit: Payer: Self-pay

## 2017-03-02 VITALS — BP 121/79 | HR 67 | Temp 98.1°F | Ht 67.0 in | Wt 254.2 lb

## 2017-03-02 DIAGNOSIS — R109 Unspecified abdominal pain: Secondary | ICD-10-CM

## 2017-03-02 DIAGNOSIS — G8918 Other acute postprocedural pain: Secondary | ICD-10-CM

## 2017-03-02 NOTE — Progress Notes (Signed)
Outpatient postop visit  03/02/2017  Sonia Martinez is an 37 y.o. female.    Procedure: Laparoscopic cholecystectomy  CC: Abdominal pain  HPI: This patient several days status post laparoscopic cholecystectomy called in yesterday with abdominal pain and nausea.  Laboratory values were obtained at that time that showed an elevated AST and ALT similar to what she had had preoperatively.  Review of the operative report stated that cholangiography could not be obtained due to the very fine caliber of the lumen of the cystic duct. No N/V. Mod Rt side pain Eating well, nml BM  Medications reviewed.    Physical Exam:  BP 121/79   Pulse 67   Temp 98.1 F (36.7 C) (Oral)   Ht 5\' 7"  (1.702 m)   Wt 254 lb 3.2 oz (115.3 kg)   LMP 02/05/2017 (Exact Date)   BMI 39.81 kg/m     PE:No ict, no jaundice Soft abd, min Rt side tenderness    Assessment/Plan:  U/S RUQ Offered admission, not ill appearing She appears well and does not feel ill.  With that in mind I recommended an ultrasound of the right upper quadrant and repeating labs tomorrow morning to trend them apparently her LFTs were elevated last even before her son was born.  Her liver biopsy was normal.  We will call her with those results tomorrow.  Lattie Hawichard E Job Holtsclaw, MD, FACS

## 2017-03-02 NOTE — Telephone Encounter (Signed)
Spoke with patient at this time. She is no better, she still has persistent pain. Per Dr.Cooper patient has been added to 03/02/17 schedule at 1:30 pm.

## 2017-03-02 NOTE — Telephone Encounter (Signed)
Call made to patient at this time. I advised her that her ultrasound has been scheduled for 2/8 at 8:00AM with [facility]. I advised that she is to have nothing to eat or drink 6 hours prior. I advised her that after she has her ultrasound to come over to the [facility] for labs to be drawn. She verbalized understanding.

## 2017-03-02 NOTE — Patient Instructions (Signed)
We have ordered some labs to be drawn today. Please proceed to the [facility] to have these tests completed prior to leaving today. You will check in at the registration desk in the medical mall. Please see walking directions below if needed.  We will call you with the results and next step in plan of care as soon as results are received.   Directions to [facility]: When leaving our office, go right. Go all of the way down to the very end of the hallway. You will have a purple wall in front of you. You will now have a tunnel to the hospital on your left hand side. Go through this tunnel and the elevators will be on your left. Go down to the 1st floor and take a slight left. The very first desk on the right hand side is the registration desk.  We have scheduled you for an Ultrasound of your Abdomen. We will call you with the details of this appointment as soon as this is scheduled.  Bring a list of medications with you to your appointment.  Please do not have anything to eat or drink after midnight prior to your Ultrasound.   If you need to reschedule your Ultrasound, you may do so by calling [phone].   Abdominal Ultrasound An ultrasound is a test that uses sound waves to take pictures of the inside of the body. An abdominal ultrasound takes pictures of the inside of the abdomen, and a pelvic ultrasound takes pictures of the inside of the pelvis. An abdominal or pelvic ultrasound may be done:  To provide information about a baby developing in the womb, such as the baby's position and heartbeat.  To check the shape or size of an organ.  To check for problems such as: ? Cysts. ? Masses. ? Inflammation. ? Kidney stones. ? Gallstones.  Tell a health care provider about:  Any allergies you have.  All medicines you are taking, including vitamins, herbs, eye drops, creams, and over-the-counter medicines.  Previous surgeries you have had.  Any medical conditions you  have.  Whether you are pregnant or may be pregnant. What are the risks? There are no known risks or complications from having this test. What happens before the procedure?  Follow instructions from your health care provider about eating or drinking restrictions.  Wear clothing that is easily washable in case the gel used for the test gets on your clothes. What happens during the procedure?  A gel will be applied to your skin. It may feel cool.  A wand-like device called a transducer will be placed on the area to be examined.  The transducer will take pictures. These will be displayed on one or more monitors that look like small TV screens. What happens after the procedure?  It is your responsibility to get your test results. Ask your health care provider or the department performing the test when your results will be ready.  Keep follow-up visits as told by your health care provider. This is important. This information is not intended to replace advice given to you by your health care provider. Make sure you discuss any questions you have with your health care provider. Document Released: 01/08/2000 Document Revised: 09/23/2015 Document Reviewed: 10/03/2014 Elsevier Interactive Patient Education  2018 [employer]

## 2017-03-02 NOTE — Telephone Encounter (Signed)
Patients here for an appointment now, and received note.

## 2017-03-03 ENCOUNTER — Telehealth: Payer: Self-pay

## 2017-03-03 ENCOUNTER — Ambulatory Visit
Admission: RE | Admit: 2017-03-03 | Discharge: 2017-03-03 | Disposition: A | Discharge status: Home / Self Care | Payer: 59 | Source: Ambulatory Visit | Attending: Surgery | Admitting: Surgery

## 2017-03-03 ENCOUNTER — Other Ambulatory Visit
Admission: RE | Admit: 2017-03-03 | Discharge: 2017-03-03 | Disposition: A | Discharge status: Home / Self Care | Payer: 59 | Source: Ambulatory Visit | Attending: Surgery | Admitting: Surgery

## 2017-03-03 DIAGNOSIS — G8918 Other acute postprocedural pain: Secondary | ICD-10-CM | POA: Diagnosis present

## 2017-03-03 LAB — CBC WITH DIFFERENTIAL/PLATELET
Basophils Absolute: 0 10*3/uL (ref 0–0.1)
Basophils Relative: 0 %
EOS ABS: 0.3 10*3/uL (ref 0–0.7)
EOS PCT: 4 %
HCT: 40.1 % (ref 35.0–47.0)
Hemoglobin: 13.6 g/dL (ref 12.0–16.0)
LYMPHS PCT: 22 %
Lymphs Abs: 1.5 10*3/uL (ref 1.0–3.6)
MCH: 30.7 pg (ref 26.0–34.0)
MCHC: 33.8 g/dL (ref 32.0–36.0)
MCV: 90.8 fL (ref 80.0–100.0)
MONO ABS: 0.4 10*3/uL (ref 0.2–0.9)
Monocytes Relative: 6 %
Neutro Abs: 4.8 10*3/uL (ref 1.4–6.5)
Neutrophils Relative %: 68 %
PLATELETS: 220 10*3/uL (ref 150–440)
RBC: 4.41 MIL/uL (ref 3.80–5.20)
RDW: 13.1 % (ref 11.5–14.5)
WBC: 7.1 10*3/uL (ref 3.6–11.0)

## 2017-03-03 LAB — COMPREHENSIVE METABOLIC PANEL
ALK PHOS: 110 U/L (ref 38–126)
ALT: 70 U/L — AB (ref 14–54)
AST: 37 U/L (ref 15–41)
Albumin: 3.5 g/dL (ref 3.5–5.0)
Anion gap: 9 (ref 5–15)
BILIRUBIN TOTAL: 0.9 mg/dL (ref 0.3–1.2)
BUN: 8 mg/dL (ref 6–20)
CALCIUM: 9.2 mg/dL (ref 8.9–10.3)
CO2: 25 mmol/L (ref 22–32)
CREATININE: 0.78 mg/dL (ref 0.44–1.00)
Chloride: 109 mmol/L (ref 101–111)
GFR calc Af Amer: >60 mL/min (ref >60)
GFR calc non Af Amer: >60 mL/min (ref >60)
GLUCOSE: 103 mg/dL — AB (ref 65–99)
POTASSIUM: 4.5 mmol/L (ref 3.5–5.1)
Sodium: 143 mmol/L (ref 135–145)
TOTAL PROTEIN: 7.1 g/dL (ref 6.5–8.1)

## 2017-03-03 NOTE — Telephone Encounter (Signed)
Patient called at this time and asked if her US results are back at this time. I reviewed her results with her and offered for her to be seen if she is still having pain. She verbalized that she would just wait until her appointment on 2/20 to be seen and if anything became worse before then that she would give us a call. I verbalized understanding.

## 2017-03-14 ENCOUNTER — Telehealth: Payer: Self-pay | Admitting: Surgery

## 2017-03-14 NOTE — Telephone Encounter (Signed)
No labs tomorrow.

## 2017-03-14 NOTE — Telephone Encounter (Signed)
Patient needs to know if she needs to get labs prior to her appointment tomorrow. Please advise

## 2017-03-15 ENCOUNTER — Ambulatory Visit (INDEPENDENT_AMBULATORY_CARE_PROVIDER_SITE_OTHER): Payer: 59 | Admitting: Surgery

## 2017-03-15 ENCOUNTER — Encounter: Payer: Self-pay | Admitting: Surgery

## 2017-03-15 VITALS — BP 131/75 | HR 79 | Temp 97.5°F | Ht 67.0 in | Wt 259.4 lb

## 2017-03-15 DIAGNOSIS — Z09 Encounter for follow-up examination after completed treatment for conditions other than malignant neoplasm: Secondary | ICD-10-CM

## 2017-03-15 NOTE — Patient Instructions (Signed)
Please call our office if you have questions or concerns.   

## 2017-03-15 NOTE — Progress Notes (Signed)
S/p lap chole Had some pain but now has resolved U/S reviewed, no evidence of complications TB nml baseline  lfts ( higher than normal).  PE NAD Abd: soft , nt . Incisions c/d/i  A/p Doing well Pain likely not uncommon postop no evidence of complications RTC prn

## 2017-04-03 ENCOUNTER — Other Ambulatory Visit: Payer: Self-pay | Admitting: Family Medicine

## 2017-04-03 MED ORDER — FLUTICASONE PROPIONATE 50 MCG/ACT NA SUSP: NASAL | 2 refills | Status: DC

## 2017-04-03 NOTE — Telephone Encounter (Signed)
Copied from CRM [id number]. Topic: Quick Communication - Rx Refill/Question >> Apr 03, 2017  8:23 AM Oneal GroutSebastian, Jennifer S wrote: Medication: fluticasone (FLONASE) 50 MCG/ACT nasal spray   Has the patient contacted their pharmacy? Yes.     (Agent: If no, request that the patient contact the pharmacy for the refill.)   Preferred Pharmacy (with phone number or street name): [facility] [address]   Agent: Please be advised that RX refills may take up to 3 business days. We ask that you follow-up with your pharmacy.

## 2017-04-03 NOTE — Telephone Encounter (Signed)
Refill request of historical Rx:  Flonase  LOV: 01/09/17  PCP: Leone PayorGessner  Pharmacy: [facility]

## 2017-04-03 NOTE — Telephone Encounter (Signed)
Pt saw Harlin Heys Gessner FNP on 01/09/17; did not see flonase mentioned in note. Last refilled # 16g x 2 by Dr Adriana Simasook. Please advise.

## 2017-07-07 NOTE — Telephone Encounter (Signed)
Preadmission screen  

## 2017-08-18 ENCOUNTER — Encounter: Payer: Self-pay | Admitting: Adult Health

## 2017-08-18 ENCOUNTER — Ambulatory Visit: Payer: Managed Care, Other (non HMO) | Admitting: Adult Health

## 2017-08-18 VITALS — BP 118/69 | HR 76 | Temp 97.8°F | Resp 18 | Wt 263.6 lb

## 2017-08-18 DIAGNOSIS — J40 Bronchitis, not specified as acute or chronic: Secondary | ICD-10-CM

## 2017-08-18 MED ORDER — PREDNISONE 10 MG (21) PO TBPK: ORAL_TABLET | ORAL | 0 refills | Status: DC

## 2017-08-18 MED ORDER — AZITHROMYCIN 250 MG PO TABS: ORAL_TABLET | ORAL | 0 refills | Status: DC

## 2017-08-18 MED ORDER — TERCONAZOLE 0.8 % VA CREA: 1.0000 | TOPICAL_CREAM | Freq: Every day | VAGINAL | 0 refills | Status: DC

## 2017-08-18 NOTE — Patient Instructions (Addendum)
Keep follow up appointment with your ENT. See Emi Belfast, FNP  for follow up evaluation in one week. Advised patient call the office or your primary care doctor for an appointment if no improvement within 72 hours or if any symptoms change or worsen at any time  Advised ER or urgent Care if after hours or on weekend. Call 911 for emergency symptoms at any time.Patinet verbalized understanding of all instructions given/reviewed and treatment plan and has no further questions or concerns at this time.    Patient verbalized understanding of all instructions given and denies any further questions at this time.    Asthma, Adult Asthma is a condition of the lungs in which the airways tighten and narrow. Asthma can make it hard to breathe. Asthma cannot be cured, but medicine and lifestyle changes can help control it. Asthma may be started (triggered) by:  Animal skin flakes (dander).  Dust.  Cockroaches.  Pollen.  Mold.  Smoke.  Cleaning products.  Hair sprays or aerosol sprays.  Paint fumes or strong smells.  Cold air, weather changes, and winds.  Crying or laughing hard.  Stress.  Certain medicines or drugs.  Foods, such as dried fruit, potato chips, and sparkling grape juice.  Infections or conditions (colds, flu).  Exercise.  Certain medical conditions or diseases.  Exercise or tiring activities.  Follow these instructions at home:  Take medicine as told by your doctor.  Use a peak flow meter as told by your doctor. A peak flow meter is a tool that measures how well the lungs are working.  Record and keep track of the peak flow meter's readings.  Understand and use the asthma action plan. An asthma action plan is a written plan for taking care of your asthma and treating your attacks.  To help prevent asthma attacks: ? Do not smoke. Stay away from secondhand smoke. ? Change your heating and air conditioning filter often. ? Limit your use of fireplaces  and wood stoves. ? Get rid of pests (such as roaches and mice) and their droppings. ? Throw away plants if you see mold on them. ? Clean your floors. Dust regularly. Use cleaning products that do not smell. ? Have someone vacuum when you are not home. Use a vacuum cleaner with a HEPA filter if possible. ? Replace carpet with wood, tile, or vinyl flooring. Carpet can trap animal skin flakes and dust. ? Use allergy-proof pillows, mattress covers, and box spring covers. ? Wash bed sheets and blankets every week in hot water and dry them in a dryer. ? Use blankets that are made of polyester or cotton. ? Clean bathrooms and kitchens with bleach. If possible, have someone repaint the walls in these rooms with mold-resistant paint. Keep out of the rooms that are being cleaned and painted. ? Wash hands often. Contact a doctor if:  You have make a whistling sound when breaking (wheeze), have shortness of breath, or have a cough even if taking medicine to prevent attacks.  The colored mucus you cough up (sputum) is thicker than usual.  The colored mucus you cough up changes from clear or white to yellow, green, gray, or bloody.  You have problems from the medicine you are taking such as: ? A rash. ? Itching. ? Swelling. ? Trouble breathing.  You need reliever medicines more than 2-3 times a week.  Your peak flow measurement is still at 50-79% of your personal best after following the action plan for 1 hour.  You have a fever. Get help right away if:  You seem to be worse and are not responding to medicine during an asthma attack.  You are short of breath even at rest.  You get short of breath when doing very little activity.  You have trouble eating, drinking, or talking.  You have chest pain.  You have a fast heartbeat.  Your lips or fingernails start to turn blue.  You are light-headed, dizzy, or faint.  Your peak flow is less than 50% of your personal best. This information  is not intended to replace advice given to you by your health care provider. Make sure you discuss any questions you have with your health care provider. Document Released: 06/29/2007 Document Revised: 06/18/2015 Document Reviewed: 08/09/2012 Elsevier Interactive Patient Education  2017 [employer] Inc[employer] Asthma, Adult Asthma is a condition of the lungs in which the airways tighten and narrow. Asthma can make it hard to breathe. Asthma cannot be cured, but medicine and lifestyle changes can help control it. Asthma may be started (triggered) by:  Animal skin flakes (dander).  Dust.  Cockroaches.  Pollen.  Mold.  Smoke.  Cleaning products.  Hair sprays or aerosol sprays.  Paint fumes or strong smells.  Cold air, weather changes, and winds.  Crying or laughing hard.  Stress.  Certain medicines or drugs.  Foods, such as dried fruit, potato chips, and sparkling grape juice.  Infections or conditions (colds, flu).  Exercise.  Certain medical conditions or diseases.  Exercise or tiring activities.  Follow these instructions at home:  Take medicine as told by your doctor.  Use a peak flow meter as told by your doctor. A peak flow meter is a tool that measures how well the lungs are working.  Record and keep track of the peak flow meter's readings.  Understand and use the asthma action plan. An asthma action plan is a written plan for taking care of your asthma and treating your attacks.  To help prevent asthma attacks: ? Do not smoke. Stay away from secondhand smoke. ? Change your heating and air conditioning filter often. ? Limit your use of fireplaces and wood stoves. ? Get rid of pests (such as roaches and mice) and their droppings. ? Throw away plants if you see mold on them. ? Clean your floors. Dust regularly. Use cleaning products that do not smell. ? Have someone vacuum when you are not home. Use a vacuum cleaner with a HEPA filter if possible. ? Replace carpet  with wood, tile, or vinyl flooring. Carpet can trap animal skin flakes and dust. ? Use allergy-proof pillows, mattress covers, and box spring covers. ? Wash bed sheets and blankets every week in hot water and dry them in a dryer. ? Use blankets that are made of polyester or cotton. ? Clean bathrooms and kitchens with bleach. If possible, have someone repaint the walls in these rooms with mold-resistant paint. Keep out of the rooms that are being cleaned and painted. ? Wash hands often. Contact a doctor if:  You have make a whistling sound when breaking (wheeze), have shortness of breath, or have a cough even if taking medicine to prevent attacks.  The colored mucus you cough up (sputum) is thicker than usual.  The colored mucus you cough up changes from clear or white to yellow, green, gray, or bloody.  You have problems from the medicine you are taking such as: ? A rash. ? Itching. ? Swelling. ? Trouble breathing.  You need  reliever medicines more than 2-3 times a week.  Your peak flow measurement is still at 50-79% of your personal best after following the action plan for 1 hour.  You have a fever. Get help right away if:  You seem to be worse and are not responding to medicine during an asthma attack.  You are short of breath even at rest.  You get short of breath when doing very little activity.  You have trouble eating, drinking, or talking.  You have chest pain.  You have a fast heartbeat.  Your lips or fingernails start to turn blue.  You are light-headed, dizzy, or faint.  Your peak flow is less than 50% of your personal best. This information is not intended to replace advice given to you by your health care provider. Make sure you discuss any questions you have with your health care provider. Document Released: 06/29/2007 Document Revised: 06/18/2015 Document Reviewed: 08/09/2012 Elsevier Interactive Patient Education  2017 Else[employer] Inc. Acute Bronchitis,  Adult Acute bronchitis is when air tubes (bronchi) in the lungs suddenly get swollen. The condition can make it hard to breathe. It can also cause these symptoms:  A cough.  Coughing up clear, yellow, or green mucus.  Wheezing.  Chest congestion.  Shortness of breath.  A fever.  Body aches.  Chills.  A sore throat.  Follow these instructions at home: Medicines  Take over-the-counter and prescription medicines only as told by your doctor.  If you were prescribed an antibiotic medicine, take it as told by your doctor. Do not stop taking the antibiotic even if you start to feel better. General instructions  Rest.  Drink enough fluids to keep your pee (urine) clear or pale yellow.  Avoid smoking and secondhand smoke. If you smoke and you need help quitting, ask your doctor. Quitting will help your lungs heal faster.  Use an inhaler, cool mist vaporizer, or humidifier as told by your doctor.  Keep all follow-up visits as told by your doctor. This is important. How is this prevented? To lower your risk of getting this condition again:  Wash your hands often with soap and water. If you cannot use soap and water, use hand sanitizer.  Avoid contact with people who have cold symptoms.  Try not to touch your hands to your mouth, nose, or eyes.  Make sure to get the flu shot every year.  Contact a doctor if:  Your symptoms do not get better in 2 weeks. Get help right away if:  You cough up blood.  You have chest pain.  You have very bad shortness of breath.  You become dehydrated.  You faint (pass out) or keep feeling like you are going to pass out.  You keep throwing up (vomiting).  You have a very bad headache.  Your fever or chills gets worse. This information is not intended to replace advice given to you by your health care provider. Make sure you discuss any questions you have with your health care provider. Document Released: 06/29/2007 Document  Revised: 08/19/2015 Document Reviewed: 07/01/2015 Elsevier Interactive Patient Education  [employer]2018 [employer]

## 2017-08-18 NOTE — Progress Notes (Addendum)
Subjective:     Patient ID: Sonia Martinez, female   DOB: 1980-02-05, 37 y.o.   MRN: [id number]  HPI   Blood pressure 118/69, pulse 76, temperature 97.8 F (36.6 C), temperature source Tympanic, resp. rate 18, weight 263 lb 9.6 oz (119.6 kg), last menstrual period 08/02/2017, SpO2 98 %, unknown if currently breastfeeding. Not breastfeeding.   Patient is a 37 year old female in no acute distress who comes to the clinic with cough for the past week. She reports she has well controlled Asthma. She is using coughing up yellow mucous. Chest congestion. Mild wheezing with ambulation.  She has been using ProAir rescue inhaler 1-2 times per day only the past week   She has history of sinus surgery and reports she had a deviated septum and has done well since her surgery - 2015. Denies any history of malignancy.  Augmentin was given 08/01/17 - she reports she took for diagnosis of sinusitis - Dr. On Demand.  Prednisone dose pack  07/20/17- ENT gave for vocal cord inflammations. - she has follow up in two weeks. She has nodules on her vocal cords and reports they are stable. She had a throat scope. She is on an antacid now from ENT. Dr. Levi Aland ENT.   Patient  denies any fever, body aches,chills, rash, chest pain, shortness of breath, nausea, vomiting, or diarrhea. Denies any respiratory distress during this recent illness and also reports she is able to do her normal activities and work today without.   She does not have    Patient's last menstrual period was 08/02/2017 (exact date).  Emi Belfast, FNP- Primary care    Allergies  Allergen Reactions  . Coconut Oil Hives    ANYTHING WITH COCONUT IN IT  . Dulera [Mometasone Furo-Formoterol Fum]     Patient can only take advair  . Ciprofloxacin Hives and Rash  . Hydrocortisone Hives and Rash     Review of Systems  Constitutional: Positive for fatigue (mild ). Negative for activity change, appetite change, chills,  diaphoresis, fever and unexpected weight change.  HENT: Positive for sore throat (denies any new symptoms - seeig ENT Dr. Willeen Cass for throat nodules- has follow up in two weeks denies any new or changing symptoms since last seeing him in office. ). Negative for congestion, dental problem, drooling, ear discharge, ear pain, facial swelling, hearing loss, mouth sores, nosebleeds, postnasal drip, rhinorrhea, sinus pressure, sinus pain, sneezing, tinnitus, trouble swallowing and voice change.   Eyes: Negative.   Respiratory: Positive for cough and chest tightness (intermittent more with walking " mild" relieves with advair inhaler ). Negative for apnea, choking, shortness of breath, wheezing and stridor.   Cardiovascular: Negative.   Gastrointestinal: Negative.   Endocrine: Negative.   Genitourinary: Negative.   Musculoskeletal: Negative.   Skin: Negative.   Allergic/Immunologic: Negative.   Neurological: Negative.   Hematological: Negative.   Psychiatric/Behavioral: Negative.        Objective:   Physical Exam  Constitutional: She is oriented to person, place, and time. Vital signs are normal. She appears well-developed and well-nourished. She is active.  Non-toxic appearance. She does not have a sickly appearance. She does not appear ill. No distress.  Patient is alert and oriented and responsive to questions Engages in eye contact with provider. Speaks in full sentences without any pauses without any shortness of breath or distress.    HENT:  Head: Normocephalic and atraumatic.  Right Ear: Hearing, external ear and ear canal normal.  Tympanic membrane is not perforated and not erythematous. No middle ear effusion.  Left Ear: Hearing, tympanic membrane, external ear and ear canal normal. Tympanic membrane is not perforated and not erythematous.  No middle ear effusion.  Nose: Nose normal. No mucosal edema or rhinorrhea. Right sinus exhibits no maxillary sinus tenderness and no frontal sinus  tenderness. Left sinus exhibits no maxillary sinus tenderness and no frontal sinus tenderness.  Mouth/Throat: Uvula is midline and mucous membranes are normal. Posterior oropharyngeal erythema (mild ) present. No oropharyngeal exudate, posterior oropharyngeal edema or tonsillar abscesses. Tonsils are 0 on the right.  Clear and moist with mild erythema.   Eyes: Pupils are equal, round, and reactive to light. Conjunctivae and EOM are normal. Right eye exhibits no discharge. Left eye exhibits no discharge. No scleral icterus.  Neck: Normal range of motion. Neck supple. No JVD present. No tracheal deviation present.  Cardiovascular: Normal rate, regular rhythm, normal heart sounds and intact distal pulses. Exam reveals no gallop and no friction rub.  No murmur heard. Pulmonary/Chest: Effort normal and breath sounds normal. No accessory muscle usage or stridor. No apnea, no tachypnea and no bradypnea. No respiratory distress. She has no decreased breath sounds. She has no wheezes. She has no rhonchi. She has no rales. She exhibits no tenderness.  Speaks in full complete sentences without any distress or pauses. Patient has no respiratory distress. No accessory muscle use.   Mild bronchial congestion audible with cough in room.   Abdominal: Soft.  Musculoskeletal: Normal range of motion.  Patient moves on and off of exam table and in room without difficulty. Gait is normal in hall and in room.   Patient is oriented to person place time and situation. Patient answers questions appropriately and engages in conversation.   Lymphadenopathy:       Head (right side): No submental, no submandibular, no tonsillar, no preauricular, no posterior auricular and no occipital adenopathy present.       Head (left side): No submental, no submandibular, no tonsillar, no preauricular, no posterior auricular and no occipital adenopathy present.    She has no cervical adenopathy.       Right cervical: No superficial  cervical, no deep cervical and no posterior cervical adenopathy present.      Left cervical: No superficial cervical, no deep cervical and no posterior cervical adenopathy present.  Neurological: She is alert and oriented to person, place, and time. She has normal strength.  Skin: Skin is warm and dry. Capillary refill takes less than 2 seconds. No rash noted. She is not diaphoretic. No erythema. No pallor.  Psychiatric: She has a normal mood and affect. Her speech is normal and behavior is normal. Judgment and thought content normal. Cognition and memory are normal.  Vitals reviewed.      Assessment:     Bronchitis  Advair is the only inhaler she reports she can use as others she has had " dizziness" in past she reports.  Patient has hydrocortisone allergy listed though she reports she has taken in July 2019. And has had steroid injections in carpal tunnel before without any issues.      Plan:     Meds ordered this encounter  Medications  . azithromycin (ZITHROMAX) 250 MG tablet    Sig: By mouth Take 2 tablets day 1 (500mg  total) and 1 tablet ( 250 mg ) on days 2,3,4,5.    Dispense:  6 tablet    Refill:  0  . predniSONE (STERAPRED UNI-PAK  21 TAB) 10 MG (21) TBPK tablet    Sig: PO: Take 6 tablets on day 1:Take 5 tablets day 2:Take 4 tablets day 3: Take 3 tablets day 4:Take 2 tablets day five: 5 Take 1 tablet day 6    Dispense:  21 tablet    Refill:  0  . terconazole (TERAZOL 3) 0.8 % vaginal cream    Sig: Place 1 applicator vaginally at bedtime. If yeast infection develops    Dispense:  20 g    Refill:  0   If using rescue inhaler more than three times per day - be seen immediately.    She requests vaginal yeast treatment if develops- she reports she developed after Augmenting in July and used Monistat. She is aware only to use if symptoms and if symptoms persist more than three days or be seen by Gynecology/PCP Patient declines recommended chest x ray.  Educated to go to  emergency room/ Call 911 if symptoms worsen and office is closed.  Keep follow up appointment with your ENT. See Emi BelfastGessner, Deborah B, FNP for follow up evaluation to discusses Asthma Action Plan with Emi BelfastGessner, Deborah B, FNP   Advised patient call the office or your primary care doctor for an appointment if no improvement within 72 hours or if any symptoms change or worsen at any time  Advised ER or urgent Care if after hours or on weekend. Call 911 for emergency symptoms at any time.Patinet verbalized understanding of all instructions given/reviewed and treatment plan and has no further questions or concerns at this time.    Patient verbalized understanding of all instructions given and denies any further questions at this time.   Provider thoroughly discussed in collaboration above plan with supervising physician Dr. Julieanne Mansonichard Gilbert who is in agreement with the care plan as above.

## 2017-08-24 DIAGNOSIS — M9902 Segmental and somatic dysfunction of thoracic region: Secondary | ICD-10-CM | POA: Diagnosis not present

## 2017-08-24 DIAGNOSIS — M9901 Segmental and somatic dysfunction of cervical region: Secondary | ICD-10-CM | POA: Diagnosis not present

## 2017-08-24 DIAGNOSIS — M531 Cervicobrachial syndrome: Secondary | ICD-10-CM | POA: Diagnosis not present

## 2017-08-24 DIAGNOSIS — M530 Cervicocranial syndrome: Secondary | ICD-10-CM | POA: Diagnosis not present

## 2017-09-01 DIAGNOSIS — K219 Gastro-esophageal reflux disease without esophagitis: Secondary | ICD-10-CM | POA: Diagnosis not present

## 2017-09-01 DIAGNOSIS — J301 Allergic rhinitis due to pollen: Secondary | ICD-10-CM | POA: Diagnosis not present

## 2017-09-01 DIAGNOSIS — J382 Nodules of vocal cords: Secondary | ICD-10-CM | POA: Diagnosis not present

## 2017-09-27 DIAGNOSIS — M530 Cervicocranial syndrome: Secondary | ICD-10-CM | POA: Diagnosis not present

## 2017-09-27 DIAGNOSIS — M9901 Segmental and somatic dysfunction of cervical region: Secondary | ICD-10-CM | POA: Diagnosis not present

## 2017-09-27 DIAGNOSIS — M531 Cervicobrachial syndrome: Secondary | ICD-10-CM | POA: Diagnosis not present

## 2017-09-27 DIAGNOSIS — M9902 Segmental and somatic dysfunction of thoracic region: Secondary | ICD-10-CM | POA: Diagnosis not present

## 2017-09-28 IMAGING — US US MFM OB DETAIL+14 WK
1 series · 14 of 28 positions shown · non-contrast
Comparison: none

[Series 1: us mfm ob detail+14 wk · 69 acquisitions, 14 frames shown]
[im 3/69]
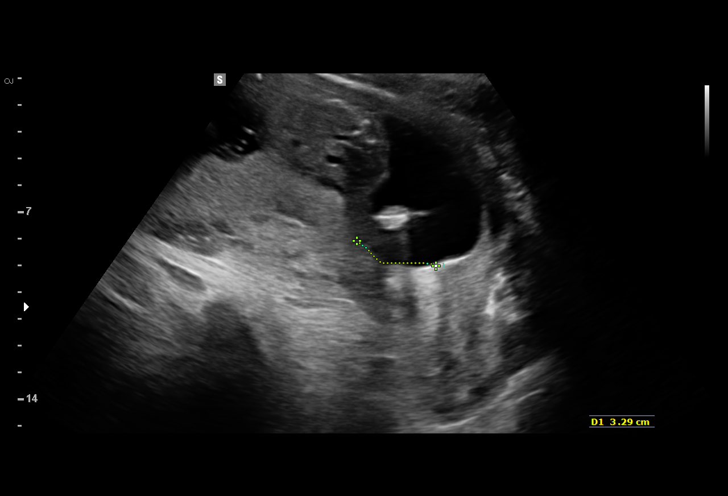
[im 8/69]
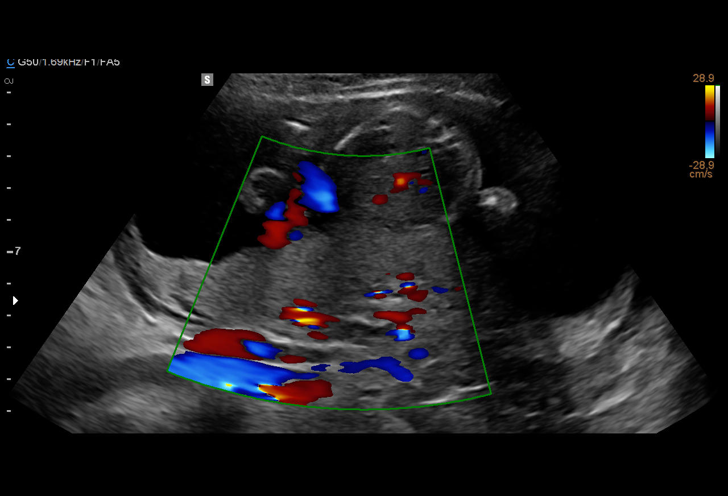
[im 13/69]
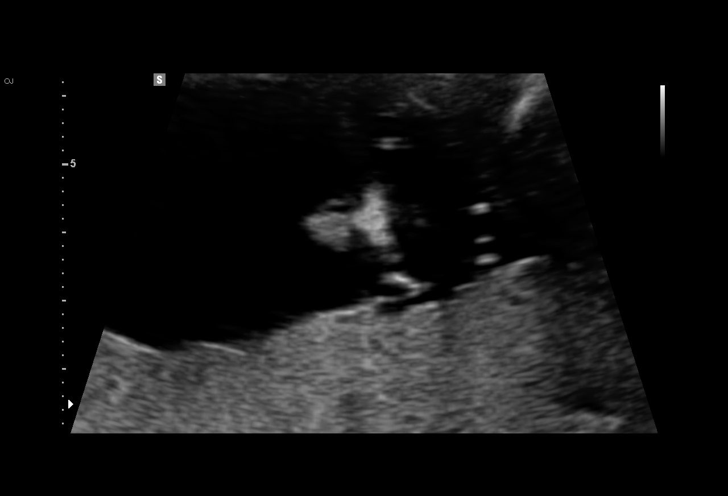
[im 18/69]
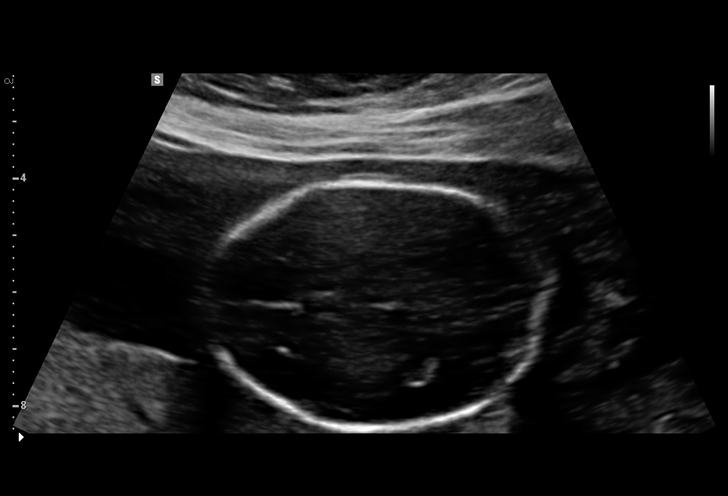
[im 23/69]
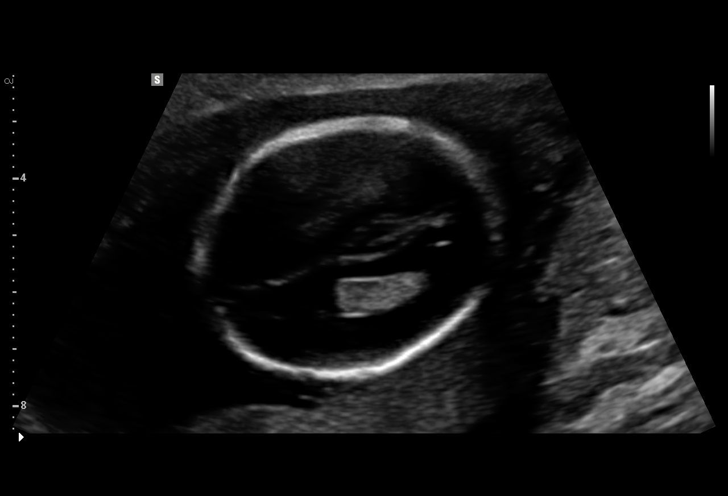
[im 28/69]
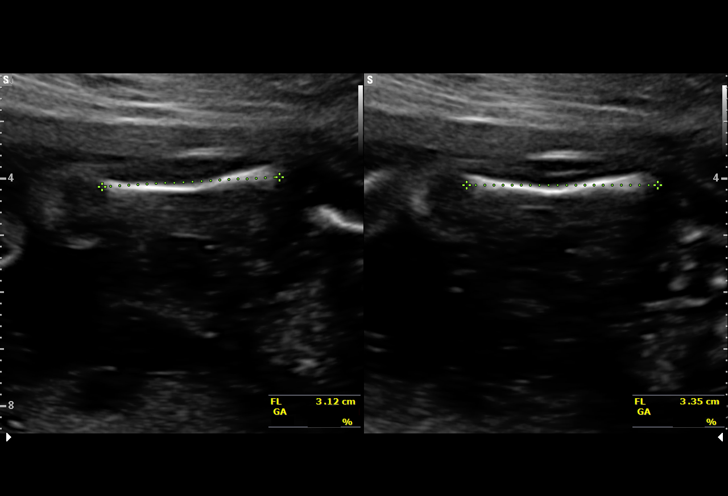
[im 33/69]
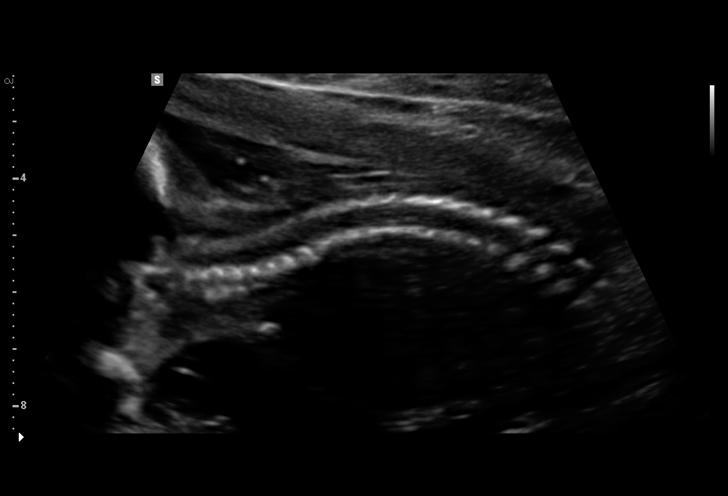
[im 38/69]
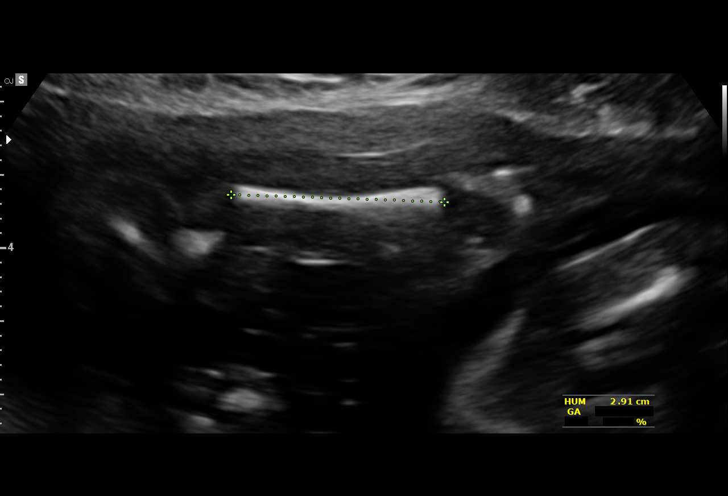
[im 43/69]
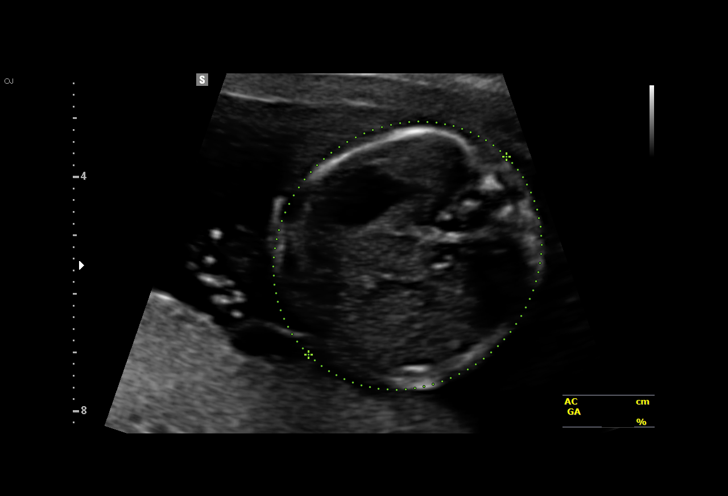
[im 48/69]
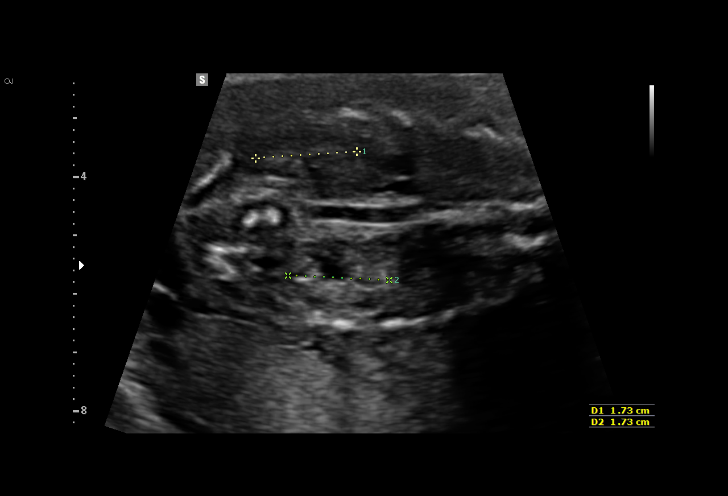
[im 53/69]
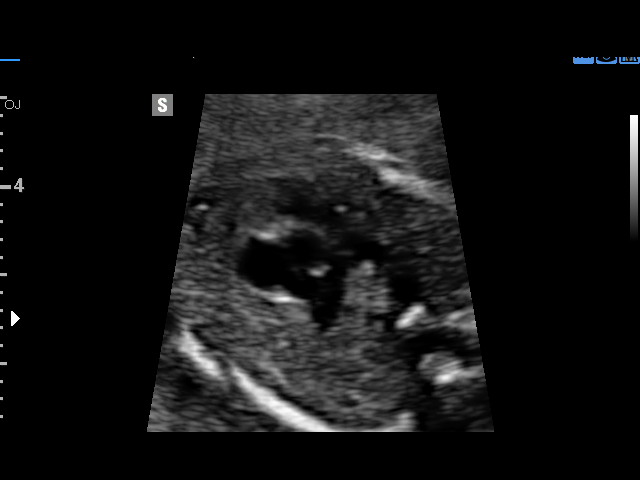
[im 58/69]
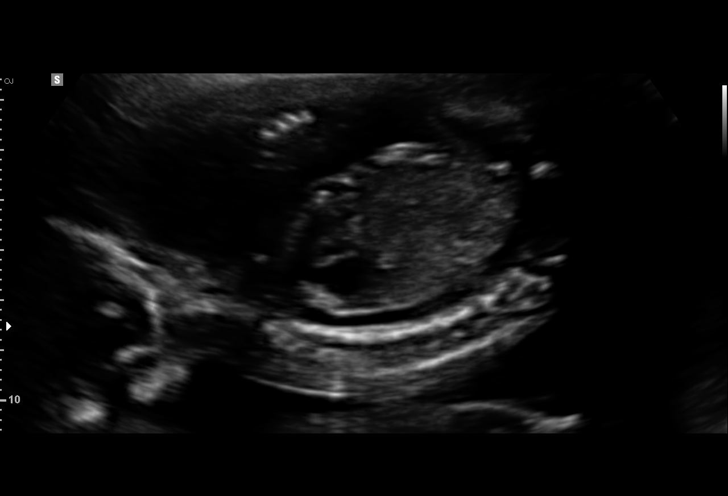
[im 63/69]
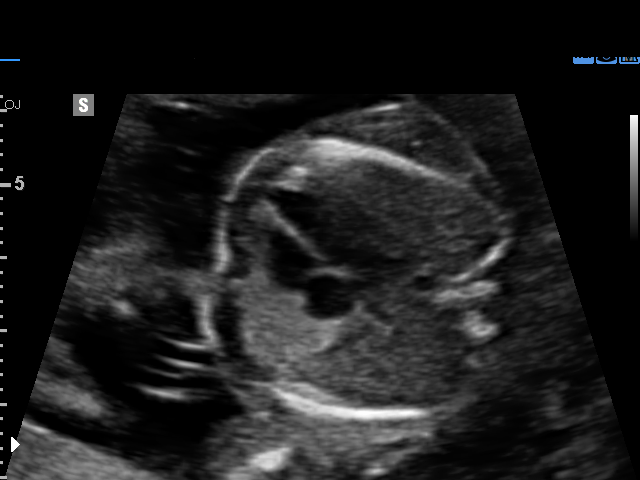
[im 69/69]
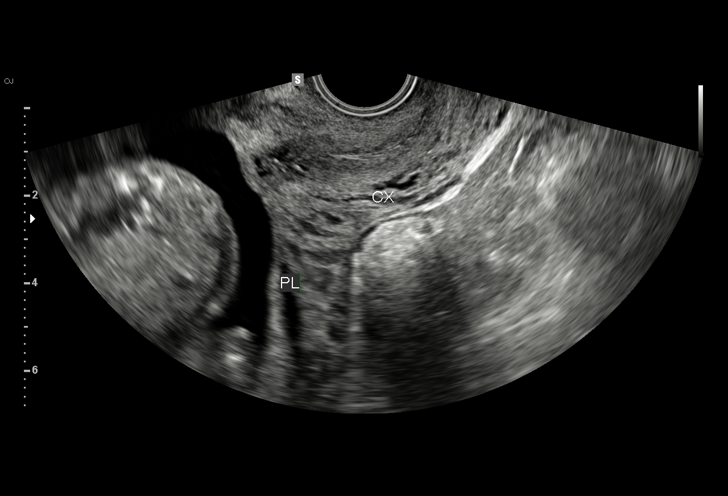

[14 of 28 positions shown; findings below may reference images not displayed]

Hospital Clinic-
Faculty Physician
OB/Gyn Clinic
JEFFREYS

1  VONN BECHTOLD          [id number]      [id number]     [id number]
Indications

19 weeks gestation of pregnancy
Detailed fetal anatomic survey                 Z36
Elevated MSAFP( ONTD [DATE])
Obesity complicating pregnancy
Low lying placenta, antepartum
OB History

Blood Type:            Height:  5'7"   Weight (lb):  259      BMI:
Gravidity:    2         Term:   1        Prem:   0        SAB:   0
TOP:          0       Ectopic:  0        Living: 1
Fetal Evaluation

Num Of Fetuses:     1
Fetal Heart         155
Rate(bpm):
Cardiac Activity:   Observed
Presentation:       Breech
Placenta:           Posterior, low-lying,1.9cm from int os
P. Cord Insertion:  Visualized

Amniotic Fluid
AFI FV:      Subjectively within normal limits

Largest Pocket(cm)
3.7
Biometry

BPD:      43.3  mm     G. Age:  19w 1d         30  %    CI:        69.43   %   70 - 86
FL/HC:      19.4   %   16.8 -
HC:      165.9  mm     G. Age:  19w 2d         30  %    HC/AC:      1.13       1.09 -
AC:      147.1  mm     G. Age:  20w 0d         59  %    FL/BPD:     74.4   %
FL:       32.2  mm     G. Age:  20w 0d         59  %    FL/AC:      21.9   %   20 - 24
HUM:      29.8  mm     G. Age:  19w 6d         57  %
CER:      21.1  mm     G. Age:  20w 1d         59  %

CM:        4.1  mm

Est. FW:     320  gm    0 lb 11 oz      53  %
Gestational Age

LMP:           19w 4d       Date:   04/17/15                 EDD:   01/22/16
U/S Today:     19w 4d                                        EDD:   01/22/16
Best:          19w 4d    Det. By:   LMP  (04/17/15)          EDD:   01/22/16
Anatomy

Cranium:               Appears normal         Aortic Arch:            Not well visualized
Cavum:                 Appears normal         Ductal Arch:            Appears normal
Ventricles:            Appears normal         Diaphragm:              Appears normal
Choroid Plexus:        Appears normal         Stomach:                Appears normal, left
sided
Cerebellum:            Appears normal         Abdomen:                Appears normal
Posterior Fossa:       Appears normal         Abdominal Wall:         Not well visualized
Nuchal Fold:           Appears normal         Cord Vessels:           Appears normal (3
vessel cord)
Face:                  Not well visualized    Kidneys:                Appear normal
Lips:                  Appears normal         Bladder:                Appears normal
Thoracic:              Appears normal         Spine:                  Not well visualized
Heart:                 Appears normal         Upper Extremities:      Appears normal
(4CH, axis, and situs
RVOT:                  Appears normal         Lower Extremities:      Appears normal
LVOT:                  Appears normal

Other:  Male gender. Technically difficult due to maternal habitus and fetal
position.
Cervix Uterus Adnexa

Cervix
Length:            3.6  cm.
Normal appearance by transabdominal scan.
Impression

Single IUP at 19w 4d
Elevated MSAFP (2.55 MoM)
Limited views of the fetal spine and abdominal cord insertion
were obtained due to fetal position
The cranial anatomy and the remainder of the fetal anatomy
appears normal
Ultrasound measurements are consistent with LMP
A posterior, low lying placenta is noted (1.5 cm from the
internal os)
Normal amniotic fluid volume
Recommendations

See separate note from Genetics counselor
Ultrasound to reevaluate the fetal spine in 4 weeks

## 2017-10-20 ENCOUNTER — Encounter: Payer: Self-pay | Admitting: Family Medicine

## 2017-10-20 ENCOUNTER — Ambulatory Visit: Payer: BLUE CROSS/BLUE SHIELD | Admitting: Family Medicine

## 2017-10-20 VITALS — BP 110/76 | HR 81 | Temp 98.4°F | Ht 67.0 in | Wt 259.0 lb

## 2017-10-20 DIAGNOSIS — Z23 Encounter for immunization: Secondary | ICD-10-CM

## 2017-10-20 DIAGNOSIS — J4540 Moderate persistent asthma, uncomplicated: Secondary | ICD-10-CM | POA: Diagnosis not present

## 2017-10-20 MED ORDER — ALBUTEROL SULFATE HFA 108 (90 BASE) MCG/ACT IN AERS: 2.0000 | INHALATION_SPRAY | RESPIRATORY_TRACT | 1 refills | Status: DC | PRN

## 2017-10-20 MED ORDER — LEVOCETIRIZINE DIHYDROCHLORIDE 5 MG PO TABS: 5.0000 mg | ORAL_TABLET | Freq: Every evening | ORAL | 1 refills | Status: DC

## 2017-10-20 NOTE — Progress Notes (Signed)
Subjective:    Patient ID: Sonia Martinez, female    DOB: 1980/03/28, 37 y.o.   MRN: [id number]  HPI This is a 37 yo female who presents today for follow up of asthma. Previously well controlled until last couple of months, having to use albuterol daily. Worse with increased heat. Better during recent cool spell. Using claritin, flonase, singulair. Was on different nasal spray that didn't seem to help.  Losing weight with weight watchers.   Past Medical History:  Diagnosis Date  . Asthma    WELL CONTROLLED  . Fatty liver   . Folliculitis 03/31/2016  . GERD (gastroesophageal reflux disease)   . Herpes simplex virus (HSV) infection 03/23/2009   Overview:  Herpes Simplex  10/1 IMO update  . History of methicillin resistant staphylococcus aureus (MRSA) 2013   + PCR SCREEN  . IBS (irritable bowel syndrome)   . Migraine without aura, without mention of intractable migraine without mention of status migrainosus 06/05/2013  . Obesity   . Oligohydramnios    2013  . Pre-eclampsia    2013  . Preeclampsia 01/05/2016  . Pregnancy induced hypertension   . Wrist joint pain 01/09/2017   Past Surgical History:  Procedure Laterality Date  . CHOLECYSTECTOMY N/A 02/24/2017   Procedure: LAPAROSCOPIC CHOLECYSTECTOMY;  Surgeon: Leafy Ro, MD;  Location: [facility] ORS;  Service: General;  Laterality: N/A;  . LIVER BIOPSY N/A 02/24/2017   Procedure: LIVER BIOPSY;  Surgeon: Leafy Ro, MD;  Location: [facility] ORS;  Service: General;  Laterality: N/A;  . SEPTOPLASTY    . WISDOM TOOTH EXTRACTION     Family History  Problem Relation Age of Onset  . Hypertension Mother   . Cancer - Other Mother        uterine  . Arthritis Mother   . Heart disease Maternal Grandfather   . Hyperlipidemia Maternal Grandfather   . Hypertension Maternal Grandfather   . Diabetes Paternal Grandmother   . Hyperlipidemia Paternal Grandmother   . Stroke Paternal Grandmother   . Hypertension Paternal Grandmother   . Lung  cancer Paternal Grandfather   . Diabetes Paternal Grandfather   . Cancer - Other Paternal Grandfather        lung  . Hyperlipidemia Paternal Grandfather   . Hypertension Paternal Grandfather   . Hypertension Father   . Arthritis Maternal Grandmother   . Hyperlipidemia Maternal Grandmother   . Stroke Maternal Grandmother   . Hypertension Maternal Grandmother   . Anesthesia problems Neg Hx   . Malignant hyperthermia Neg Hx   . Pseudochol deficiency Neg Hx   . Hypotension Neg Hx   . Migraines Neg Hx    Social History   Tobacco Use  . Smoking status: Never Smoker  . Smokeless tobacco: Never Used  Substance Use Topics  . Alcohol use: No  . Drug use: No      Review of Systems Per HPI    Objective:   Physical Exam Physical Exam  Constitutional: Oriented to person, place, and time. H She appears well-developed and well-nourished.  HENT:  Head: Normocephalic and atraumatic.  Eyes: Conjunctivae are normal.  Neck: Normal range of motion. Neck supple.  Cardiovascular: Normal rate, regular rhythm and normal heart sounds.   Pulmonary/Chest: Effort normal and breath sounds normal.  Musculoskeletal: Normal range of motion.  Neurological: Alert and oriented to person, place, and time.  Skin: Skin is warm and dry.  Psychiatric: Normal mood and affect. Behavior is normal. Judgment and thought content normal.  Vitals reviewed.     BP 110/76 (BP Location: Right Arm, Patient Position: Sitting, Cuff Size: Large)   Pulse 81   Temp 98.4 F (36.9 C) (Oral)   Ht 5\' 7"  (1.702 m)   Wt 259 lb (117.5 kg)   LMP 10/12/2017   SpO2 98%   BMI 40.57 kg/m  Wt Readings from Last 3 Encounters:  10/20/17 259 lb (117.5 kg)  08/18/17 263 lb 9.6 oz (119.6 kg)  03/15/17 259 lb 6.4 oz (117.7 kg)       Assessment & Plan:  1. Moderate persistent asthma without complication - Provided written and verbal information regarding diagnosis and treatment. - change antihistamine to Xyzal - if not  improved in a couple of weeks, consider increasing Advair- she will let me know via mychart - albuterol (PROAIR HFA) 108 (90 Base) MCG/ACT inhaler; Inhale 2 puffs into the lungs every 4 (four) hours as needed for wheezing or shortness of breath.  Dispense: 1 Inhaler; Refill: 1   Olean Ree, FNP-BC  [facility], [facility]  10/20/2017 8:17 AM

## 2017-10-20 NOTE — Patient Instructions (Signed)
Try Xyzol antihistamine instead of claritin If not improved in a couple of weeks, please send me a my chart message  Asthma, Adult Asthma is a condition of the lungs in which the airways tighten and narrow. Asthma can make it hard to breathe. Asthma cannot be cured, but medicine and lifestyle changes can help control it. Asthma may be started (triggered) by:  Animal skin flakes (dander).  Dust.  Cockroaches.  Pollen.  Mold.  Smoke.  Cleaning products.  Hair sprays or aerosol sprays.  Paint fumes or strong smells.  Cold air, weather changes, and winds.  Crying or laughing hard.  Stress.  Certain medicines or drugs.  Foods, such as dried fruit, potato chips, and sparkling grape juice.  Infections or conditions (colds, flu).  Exercise.  Certain medical conditions or diseases.  Exercise or tiring activities.  Follow these instructions at home:  Take medicine as told by your doctor.  Use a peak flow meter as told by your doctor. A peak flow meter is a tool that measures how well the lungs are working.  Record and keep track of the peak flow meter's readings.  Understand and use the asthma action plan. An asthma action plan is a written plan for taking care of your asthma and treating your attacks.  To help prevent asthma attacks: ? Do not smoke. Stay away from secondhand smoke. ? Change your heating and air conditioning filter often. ? Limit your use of fireplaces and wood stoves. ? Get rid of pests (such as roaches and mice) and their droppings. ? Throw away plants if you see mold on them. ? Clean your floors. Dust regularly. Use cleaning products that do not smell. ? Have someone vacuum when you are not home. Use a vacuum cleaner with a HEPA filter if possible. ? Replace carpet with wood, tile, or vinyl flooring. Carpet can trap animal skin flakes and dust. ? Use allergy-proof pillows, mattress covers, and box spring covers. ? Wash bed sheets and blankets  every week in hot water and dry them in a dryer. ? Use blankets that are made of polyester or cotton. ? Clean bathrooms and kitchens with bleach. If possible, have someone repaint the walls in these rooms with mold-resistant paint. Keep out of the rooms that are being cleaned and painted. ? Wash hands often. Contact a doctor if:  You have make a whistling sound when breaking (wheeze), have shortness of breath, or have a cough even if taking medicine to prevent attacks.  The colored mucus you cough up (sputum) is thicker than usual.  The colored mucus you cough up changes from clear or white to yellow, green, gray, or bloody.  You have problems from the medicine you are taking such as: ? A rash. ? Itching. ? Swelling. ? Trouble breathing.  You need reliever medicines more than 2-3 times a week.  Your peak flow measurement is still at 50-79% of your personal best after following the action plan for 1 hour.  You have a fever. Get help right away if:  You seem to be worse and are not responding to medicine during an asthma attack.  You are short of breath even at rest.  You get short of breath when doing very little activity.  You have trouble eating, drinking, or talking.  You have chest pain.  You have a fast heartbeat.  Your lips or fingernails start to turn blue.  You are light-headed, dizzy, or faint.  Your peak flow is less than  50% of your personal best. This information is not intended to replace advice given to you by your health care provider. Make sure you discuss any questions you have with your health care provider. Document Released: 06/29/2007 Document Revised: 06/18/2015 Document Reviewed: 08/09/2012 Elsevier Interactive Patient Education  2017 [employer]

## 2017-10-25 DIAGNOSIS — M9901 Segmental and somatic dysfunction of cervical region: Secondary | ICD-10-CM | POA: Diagnosis not present

## 2017-10-25 DIAGNOSIS — M531 Cervicobrachial syndrome: Secondary | ICD-10-CM | POA: Diagnosis not present

## 2017-10-25 DIAGNOSIS — M530 Cervicocranial syndrome: Secondary | ICD-10-CM | POA: Diagnosis not present

## 2017-10-25 DIAGNOSIS — M9902 Segmental and somatic dysfunction of thoracic region: Secondary | ICD-10-CM | POA: Diagnosis not present

## 2017-10-27 IMAGING — US US MFM OB FOLLOW-UP
1 series · 14 of 28 positions shown · non-contrast
Comparison: none

[Series 1: us mfm ob follow-up · 14 of 90 slices shown]
[im 4/90]
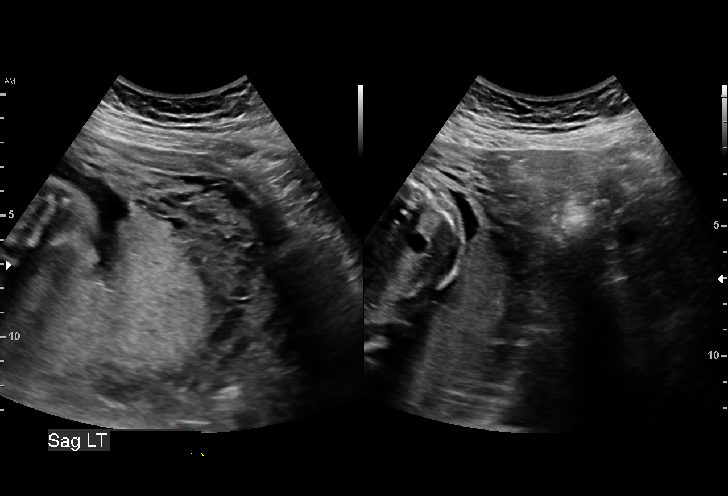
[im 10/90]
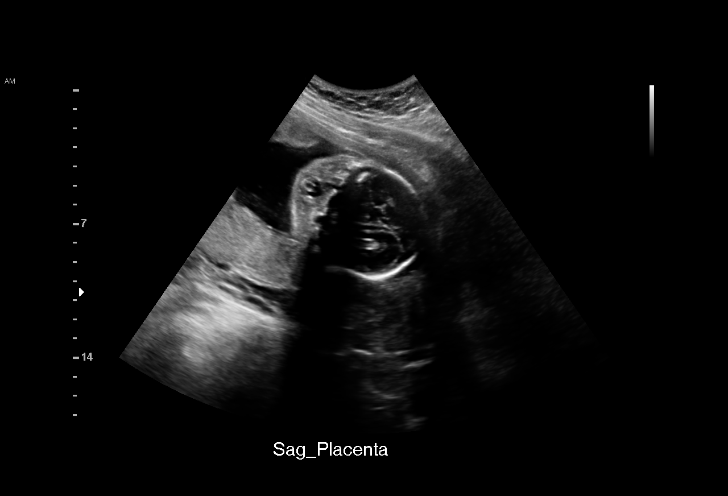
[im 17/90]
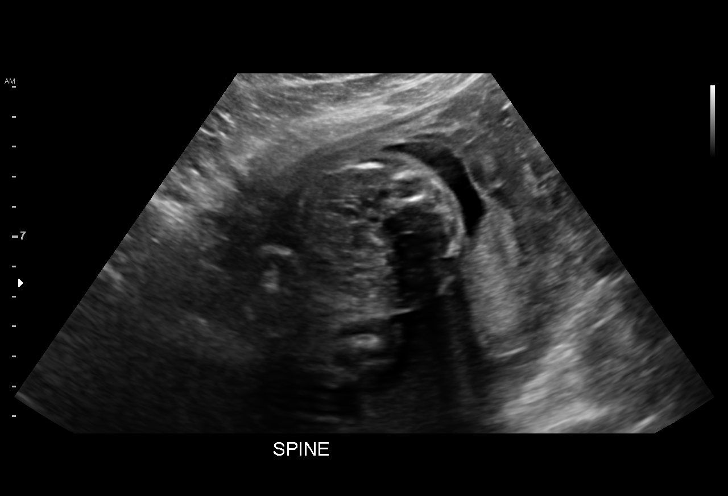
[im 24/90]
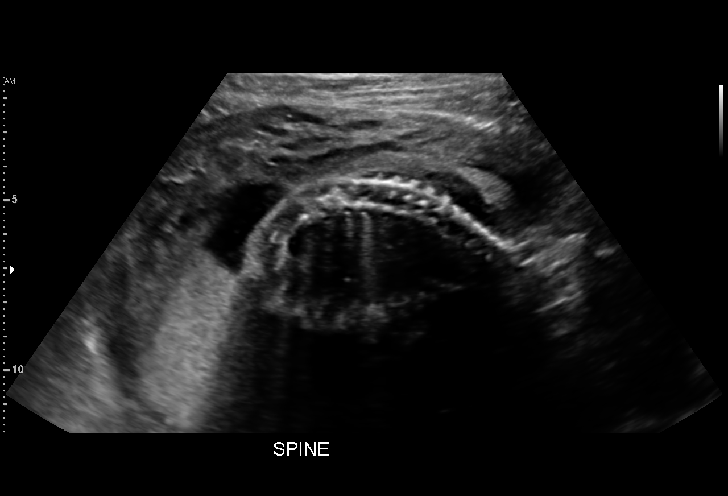
[im 30/90]
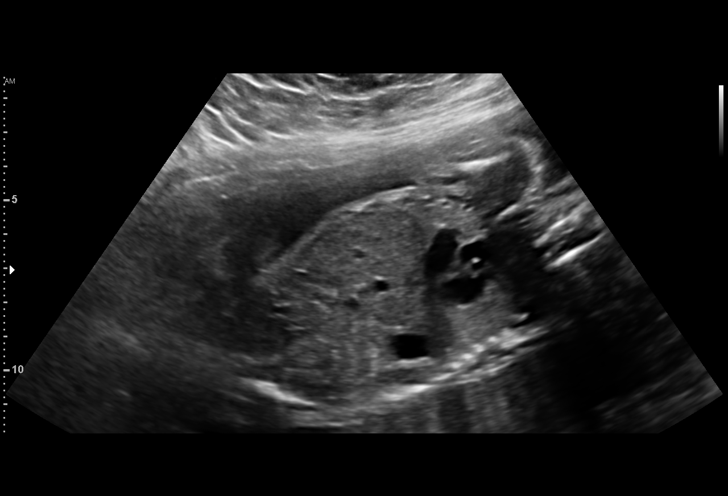
[im 37/90]
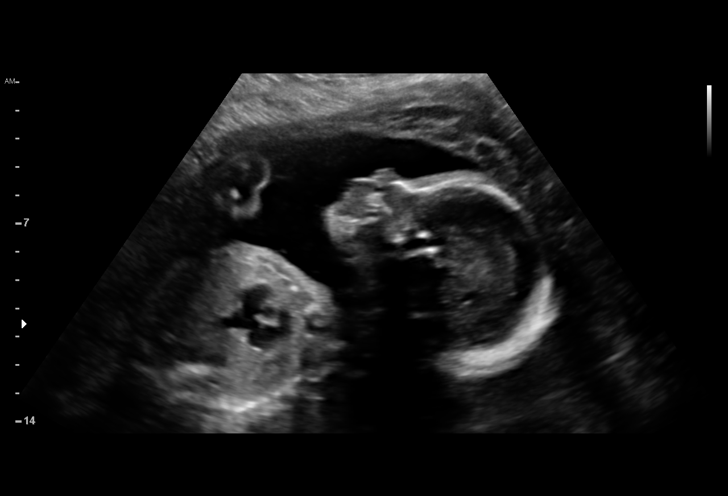
[im 43/90]
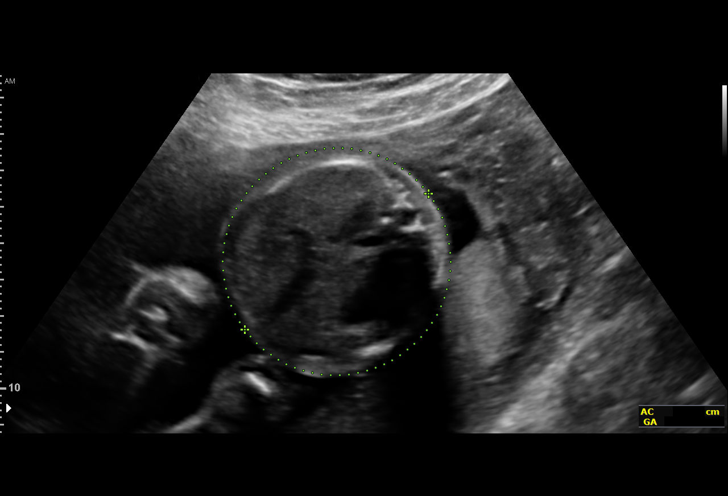
[im 50/90]
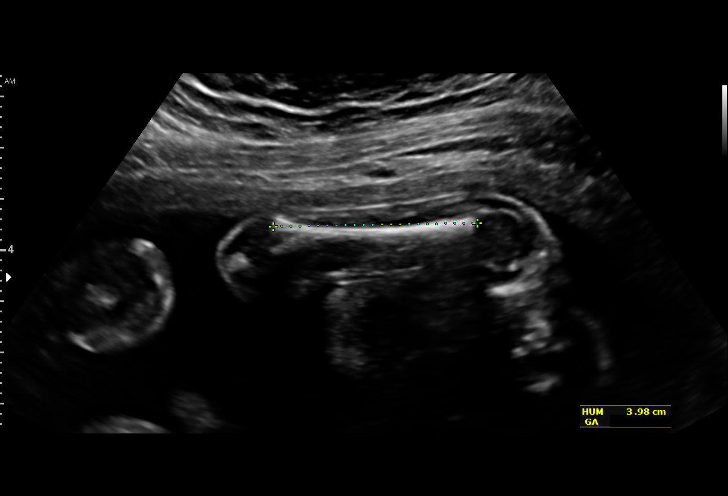
[im 57/90]
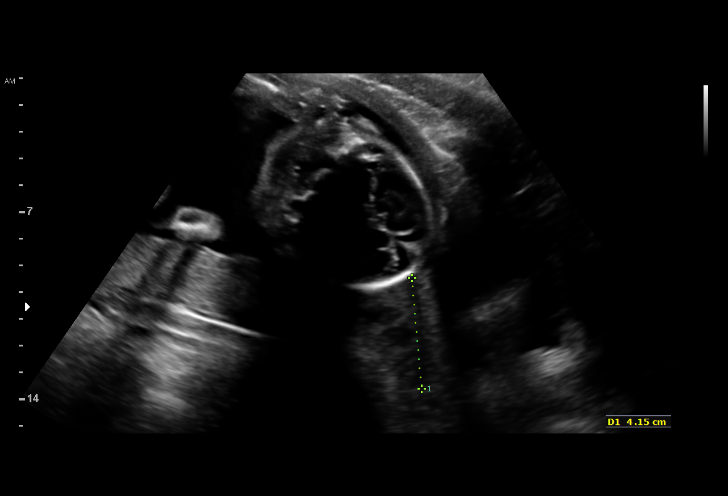
[im 63/90]
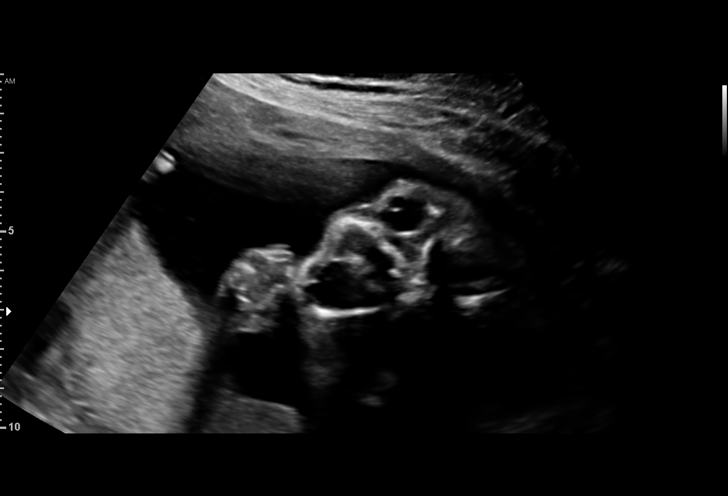
[im 70/90]
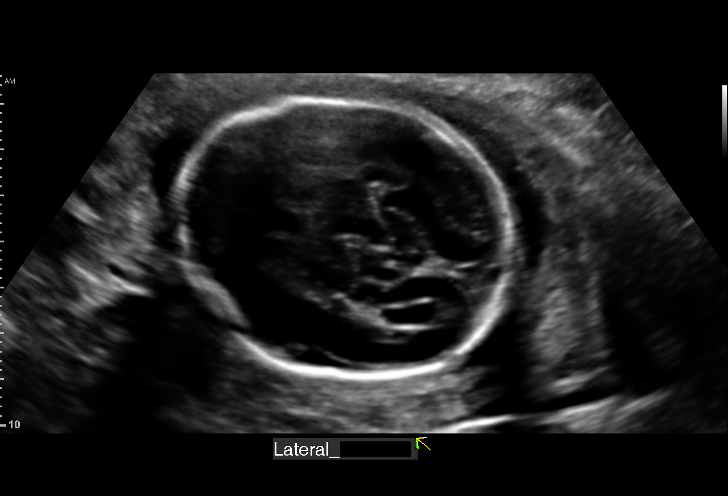
[im 76/90]
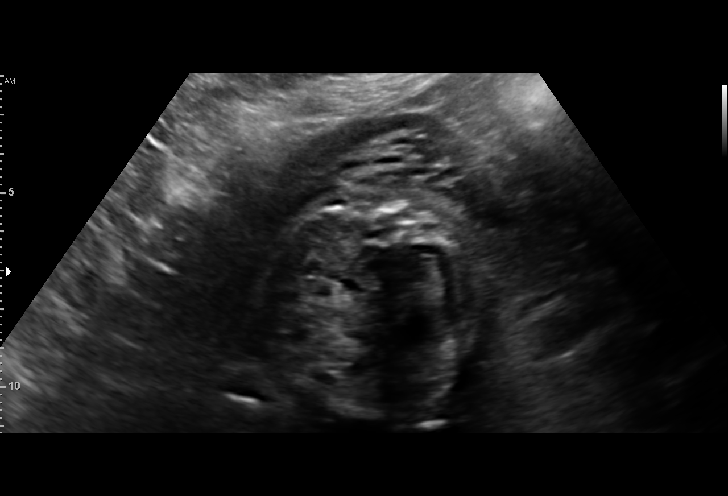
[im 83/90]
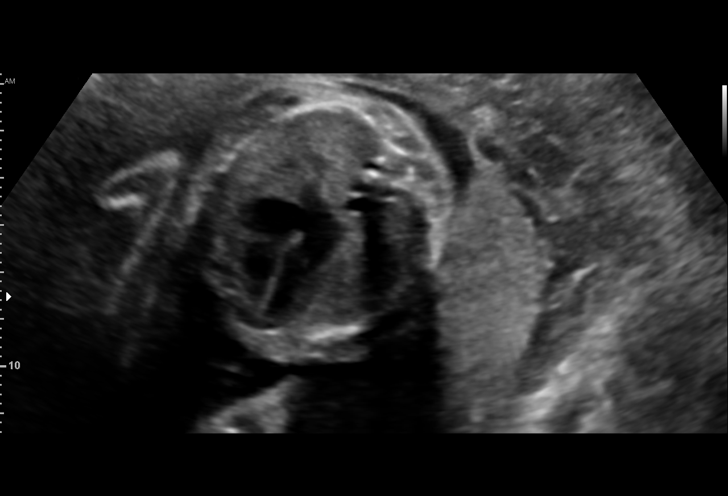
[im 90/90]
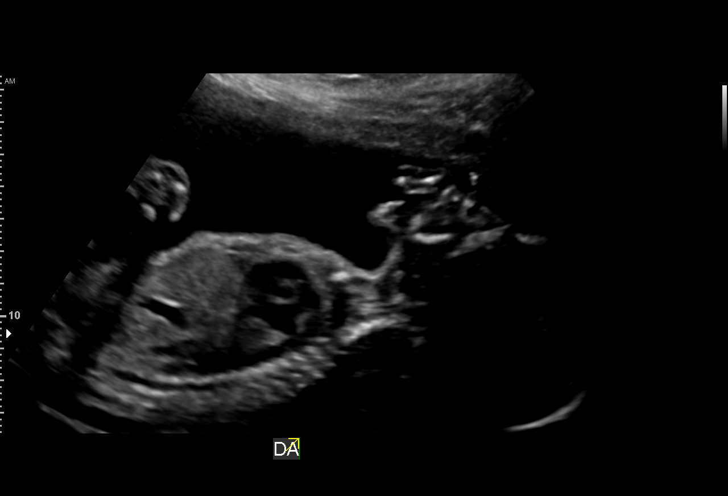

[14 of 28 positions shown; findings below may reference images not displayed]

OB/Gyn Clinic
OWENIE

1  MATE ONTIVEROS            [id number]      [id number]     [id number]
Indications

23 weeks gestation of pregnancy
Elevated MSAFP( ONTD [DATE])
Obesity complicating pregnancy
Low lying placenta, antepartum
Follow-up incomplete fetal anatomic            Z36
evaluation
OB History

Blood Type:            Height:  5'7"   Weight (lb):  259       BMI:
Gravidity:    2         Term:   1        Prem:   0        SAB:   0
TOP:          0       Ectopic:  0        Living: 1
Fetal Evaluation

Num Of Fetuses:     1
Fetal Heart         154
Rate(bpm):
Cardiac Activity:   Observed
Presentation:       Cephalic
Placenta:           Posterior, above cervical os
P. Cord Insertion:  Visualized, central

Amniotic Fluid
AFI FV:      Subjectively within normal limits

Largest Pocket(cm)
6.01
Biometry

BPD:      59.2  mm     G. Age:  24w 1d         62  %    CI:        74.44   %    70 - 86
FL/HC:      20.2   %    18.7 -
HC:      217.8  mm     G. Age:  23w 5d         38  %    HC/AC:      1.11        1.05 -
AC:      195.8  mm     G. Age:  24w 2d         59  %    FL/BPD:     74.2   %    71 - 87
FL:       43.9  mm     G. Age:  24w 3d         61  %    FL/AC:      22.4   %    20 - 24
HUM:      39.9  mm     G. Age:  24w 2d         55  %

Est. FW:     679  gm      1 lb 8 oz     62  %
Gestational Age

LMP:           23w 5d        Date:  04/17/15                 EDD:   01/22/16
U/S Today:     24w 1d                                        EDD:   01/19/16
Best:          23w 5d     Det. By:  LMP  (04/17/15)          EDD:   01/22/16
Anatomy

Cranium:               Appears normal         Aortic Arch:            Not well visualized
Cavum:                 Appears normal         Ductal Arch:            Appears normal
Ventricles:            Appears normal         Diaphragm:              Appears normal
Choroid Plexus:        Previously seen        Stomach:                Appears normal, left
sided
Cerebellum:            Previously seen        Abdomen:                Appears normal
Posterior Fossa:       Previously seen        Abdominal Wall:         Appears nml (cord
insert, abd wall)
Nuchal Fold:           Previously seen        Cord Vessels:           Previously seen
Face:                  Appears normal         Kidneys:                Appear normal
(orbits and profile)
Lips:                  Appears normal         Bladder:                Appears normal
Thoracic:              Appears normal         Spine:                  Appears normal
Heart:                 Appears normal         Upper Extremities:      Previously seen
(4CH, axis, and situs
RVOT:                  Previously seen        Lower Extremities:      Previously seen
LVOT:                  Appears normal

Other:  Male gender previously seen.. Technically difficult due to maternal
habitus and fetal position.
Impression

Single IUP at 23w 5d
Elevated MSAFP (2.55 MoM)
active singleton fetus
EFW 62nd%'le
no dysmorphic features
no previa
Normal amniotic fluid volume
Recommendations

Interval growth in 6 weeks.

## 2017-11-02 ENCOUNTER — Telehealth: Payer: Self-pay | Admitting: Family Medicine

## 2017-11-02 NOTE — Telephone Encounter (Signed)
Copied from CRM [id number]. Topic: Quick Communication - See Telephone Encounter >> Nov 02, 2017  1:24 PM Arlyss Gandy, NT wrote: CRM for notification. See Telephone encounter for: 11/02/17. Pt would to see if she can get a 14 day increase in her Advair medication? She states with the change in weather her breathing has been hard to manage. Please advise.   [address] [place], [place] - [address] [phone] (Phone) [phone] (Fax)

## 2017-11-02 NOTE — Telephone Encounter (Signed)
Please call patient and tell her that in order to increase the dose from 1 puff twice a day, she will need a different prescription- the 250/50 instead of the 100/50. The maximum dose for the salmeterol is 100 mg a day. Is she using antihistamine?

## 2017-11-02 NOTE — Telephone Encounter (Signed)
Is a 14 day increase okay? Would I just order an additional inhaler?

## 2017-11-03 ENCOUNTER — Other Ambulatory Visit: Payer: Self-pay | Admitting: Family Medicine

## 2017-11-03 DIAGNOSIS — J4540 Moderate persistent asthma, uncomplicated: Secondary | ICD-10-CM

## 2017-11-03 MED ORDER — FLUTICASONE-SALMETEROL 250-50 MCG/DOSE IN AEPB: 1.0000 | INHALATION_SPRAY | Freq: Two times a day (BID) | RESPIRATORY_TRACT | 0 refills | Status: DC

## 2017-11-03 NOTE — Telephone Encounter (Signed)
Please Advise

## 2017-11-03 NOTE — Telephone Encounter (Signed)
Pt calling to check status. Please call back  °

## 2017-11-03 NOTE — Telephone Encounter (Signed)
Called and spoke with patient. Has had to go up on Advair to 250/50 in past, she thinks increased cough/chest tightness related to change of season/leaves. Using albuterol multiple times a day with Xyzal, singulair and fluticasone. Discussed temporary increase of Advair to 250/50 BID for short time. She will let me know if no improvement in 1 week.

## 2017-11-03 NOTE — Telephone Encounter (Signed)
Pt called and states she was asked if she is on an antihistamine. She is taking xyzal 1x day.

## 2017-11-03 NOTE — Telephone Encounter (Signed)
Called and left detailed message for pt to return call to office and inform us of what she is taking.

## 2017-11-08 ENCOUNTER — Other Ambulatory Visit: Payer: Self-pay | Admitting: Family Medicine

## 2017-11-08 DIAGNOSIS — J4541 Moderate persistent asthma with (acute) exacerbation: Secondary | ICD-10-CM

## 2017-11-08 MED ORDER — PREDNISONE 20 MG PO TABS: 20.0000 mg | ORAL_TABLET | Freq: Every day | ORAL | 0 refills | Status: DC

## 2017-11-08 MED ORDER — AMOXICILLIN 875 MG PO TABS: 875.0000 mg | ORAL_TABLET | Freq: Two times a day (BID) | ORAL | 0 refills | Status: DC

## 2017-11-08 NOTE — Telephone Encounter (Signed)
Called and spoke with patient who reports continued cough, productive of yellow sputum, chest tightness, fatigue. No fever. Antibiotic and prednisone sent to patient's pharmacy.

## 2017-11-08 NOTE — Telephone Encounter (Signed)
Pt states that she is still not feeling a lot better and the cough is getting worse. She would like to see if she needs an appt for the prednisone or if the dr can call that in?

## 2017-11-22 DIAGNOSIS — M9902 Segmental and somatic dysfunction of thoracic region: Secondary | ICD-10-CM | POA: Diagnosis not present

## 2017-11-22 DIAGNOSIS — M531 Cervicobrachial syndrome: Secondary | ICD-10-CM | POA: Diagnosis not present

## 2017-11-22 DIAGNOSIS — M9901 Segmental and somatic dysfunction of cervical region: Secondary | ICD-10-CM | POA: Diagnosis not present

## 2017-11-22 DIAGNOSIS — M530 Cervicocranial syndrome: Secondary | ICD-10-CM | POA: Diagnosis not present

## 2017-11-30 ENCOUNTER — Encounter: Payer: Self-pay | Admitting: Adult Health

## 2017-11-30 ENCOUNTER — Ambulatory Visit: Payer: Self-pay | Admitting: Adult Health

## 2017-11-30 VITALS — BP 138/79 | HR 76 | Temp 98.3°F | Resp 16 | Ht 67.0 in | Wt 261.0 lb

## 2017-11-30 DIAGNOSIS — J4541 Moderate persistent asthma with (acute) exacerbation: Secondary | ICD-10-CM

## 2017-11-30 DIAGNOSIS — R05 Cough: Secondary | ICD-10-CM

## 2017-11-30 DIAGNOSIS — R059 Cough, unspecified: Secondary | ICD-10-CM

## 2017-11-30 DIAGNOSIS — J40 Bronchitis, not specified as acute or chronic: Secondary | ICD-10-CM

## 2017-11-30 MED ORDER — PREDNISONE 10 MG (21) PO TBPK: ORAL_TABLET | ORAL | 0 refills | Status: DC

## 2017-11-30 MED ORDER — AZITHROMYCIN 250 MG PO TABS: ORAL_TABLET | ORAL | 0 refills | Status: DC

## 2017-11-30 NOTE — Progress Notes (Signed)
Subjective:     Patient ID: Sonia Martinez, female   DOB: 25-Jan-1980, 37 y.o.   MRN: [id number]  HPI  Patient is a 37 year old female in no acute distress who comes to the clinic for cough/ congestion.  She reports she saw me in 08/18/17 and all symptoms resolved. She has since been treated for cough congestion treated with Amoxicillin and prednisone. She reports that all of her symptoms resolved then.  She reports symptoms resolved on 10/10 had cough cold and was treated for moderate persistent asthmatic bronchitis on 11/08/17 by her PCP Emi Belfast, FNP with Amoxicillin 875 mg BID x 7 days as well as Prednisone 20 mg daily x 7 days. She reports symptoms improved.   She reports she was allergy tested and nothing showed positive. She is using allergy and asthma medications as advised by her Emi Belfast, FNP.   Currently she  reports she started having cough and phlegm yellow tinged, post nasal drip. Mild fatigue. Onset x 7- 10 day.  Denies any wheezing with this episode Bronchial chest congestion / tightness reported. Using Mucinex. She coulld not see her PCP today as she was off per patient. Mild fatigue. Denies any history of pneumonia.  PCP increased to Fluticasone - salmeterol inhaler and discontinued Claritin and now taking Xyzal 24 hour. She is still using Singulair.   Child has cold.   LMP 11/03/2017- denies any chance of pregnancy.  No edema.    Last on Amoxicillin and prednisone 11/08/17 from pcp for bronchitis.  Sees Dr. Willeen Cass ENT per patient.  LMP  History of asthma.    Allergies  Allergen Reactions  . Coconut Oil Hives    ANYTHING WITH COCONUT IN IT  . Dulera [Mometasone Furo-Formoterol Fum]     Patient can only take advair  . Ciprofloxacin Hives and Rash  . Hydrocortisone Hives and Rash    Review of Systems  Constitutional: Positive for fatigue. Negative for activity change, appetite change, chills, diaphoresis, fever and unexpected weight  change.  HENT: Positive for congestion, postnasal drip, rhinorrhea and voice change (chronic intermittent hoarseness followed by ENT). Negative for dental problem, drooling, ear discharge, ear pain, facial swelling, hearing loss, mouth sores, nosebleeds, sinus pressure, sinus pain, sneezing, sore throat, tinnitus and trouble swallowing.   Eyes: Negative.   Respiratory: Positive for cough and chest tightness. Negative for apnea, choking, shortness of breath, wheezing and stridor.   Cardiovascular: Negative.  Negative for chest pain, palpitations and leg swelling.  Gastrointestinal: Negative.   Genitourinary: Negative.   Musculoskeletal: Negative.   Skin: Negative.   Allergic/Immunologic:        -- Coconut Oil -- Hives   --  ANYTHING WITH COCONUT IN IT  -- Dulera (Mometasone Furo-Formoterol Fum)    --  Patient can only take advair  -- Ciprofloxacin -- Hives and Rash  -- Hydrocortisone -- Hives and Rash Able to use Prednisone without any difficulty per patient.   Neurological: Negative.   Hematological: Negative.   Psychiatric/Behavioral: Negative.        Objective:   Physical Exam  Constitutional: Vital signs are normal. She appears well-developed and well-nourished. She is active.  Non-toxic appearance. She does not have a sickly appearance. She does not appear ill. No distress.  HENT:  Head: Normocephalic and atraumatic.  Right Ear: Hearing and external ear normal. Tympanic membrane is not perforated and not erythematous. A middle ear effusion is present.  Left Ear: Hearing and external ear normal. Tympanic  membrane is not perforated and not erythematous. A middle ear effusion is present.  Nose: Nose normal. No mucosal edema or rhinorrhea. Right sinus exhibits no maxillary sinus tenderness and no frontal sinus tenderness. Left sinus exhibits no maxillary sinus tenderness and no frontal sinus tenderness.  Mouth/Throat: Uvula is midline. Posterior oropharyngeal erythema present. No  oropharyngeal exudate, posterior oropharyngeal edema or tonsillar abscesses. Tonsils are 0 on the right. Tonsils are 0 on the left.  Eyes: Pupils are equal, round, and reactive to light. Conjunctivae, EOM and lids are normal.  Neck: Trachea normal, normal range of motion, full passive range of motion without pain and phonation normal. Neck supple. No Brudzinski's sign noted.  Cardiovascular: Normal rate, regular rhythm, normal heart sounds and intact distal pulses. Exam reveals no gallop and no friction rub.  No murmur heard. Pulmonary/Chest: Effort normal. No stridor. No respiratory distress. She has no decreased breath sounds. She has wheezes in the right upper field and the left upper field. She has no rhonchi. She has no rales. She exhibits no tenderness.  Mild expiratory wheezing.  Bronchial congestion audible in room and with ausculation.   Abdominal: Soft.  Musculoskeletal: Normal range of motion.  Lymphadenopathy:       Head (right side): No submental, no submandibular, no tonsillar, no preauricular, no posterior auricular and no occipital adenopathy present.       Head (left side): No submental, no submandibular, no tonsillar, no preauricular, no posterior auricular and no occipital adenopathy present.    She has no cervical adenopathy.  Neurological: She is alert.  Skin: Skin is warm, dry and intact. Capillary refill takes less than 2 seconds. She is not diaphoretic. Nails show no clubbing.  Psychiatric: She has a normal mood and affect. Her speech is normal and behavior is normal. Judgment and thought content normal. Cognition and memory are normal.       Assessment:     Bronchitis  Moderate persistent asthma with acute exacerbation  Cough - Plan: DG Chest 2 View      Plan:     Continue asthma allergy medications as prescribed by your primary care. Follow up with your primary care within 1 week. Recommend chest x ray for baseline since persistent recurrence- order was  placed and she is aware she can walk  In to [facility] and go to radiology to have CXR performed.     Meds ordered this encounter  Medications  . predniSONE (STERAPRED UNI-PAK 21 TAB) 10 MG (21) TBPK tablet    Sig: PO: Take 6 tablets on day 1:Take 5 tablets day 2:Take 4 tablets day 3: Take 3 tablets day 4:Take 2 tablets day five: 5 Take 1 tablet day 6    Dispense:  21 tablet    Refill:  0  . azithromycin (ZITHROMAX) 250 MG tablet    Sig: By mouth Take 2 tablets day 1 (500mg  total) and 1 tablet ( 250 mg ) on days 2,3,4,5.    Dispense:  6 tablet    Refill:  0   Orders Placed This Encounter  Procedures  . DG Chest 2 View    Standing Status:   Future    Standing Expiration Date:   12/30/2017    Order Specific Question:   Reason for Exam (SYMPTOM  OR DIAGNOSIS REQUIRED)    Answer:   cough recurrent bronchitis    Order Specific Question:   Is patient pregnant?    Answer:   No    Comments:  LMP 11/06/2017    Order Specific Question:   Preferred imaging location?    Answer:   [facility]    Order Specific Question:   Call Results- Best Contact Number?    Answer:   [phone]    Order Specific Question:   Radiology Contrast Protocol - do NOT remove file path    Answer:   [email or web address]    Advised patient call the office or your primary care doctor for an appointment if no improvement within 72 hours or if any symptoms change or worsen at any time  Advised ER or urgent Care if after hours or on weekend. Call 911 for emergency symptoms at any time.Patinet verbalized understanding of all instructions given/reviewed and treatment plan and has no further questions or concerns at this time.    Patient verbalized understanding of all instructions given and denies any further questions at this time.

## 2017-11-30 NOTE — Patient Instructions (Signed)
Asthma, Adult Asthma is a condition of the lungs in which the airways tighten and narrow. Asthma can make it hard to breathe. Asthma cannot be cured, but medicine and lifestyle changes can help control it. Asthma may be started (triggered) by:  Animal skin flakes (dander).  Dust.  Cockroaches.  Pollen.  Mold.  Smoke.  Cleaning products.  Hair sprays or aerosol sprays.  Paint fumes or strong smells.  Cold air, weather changes, and winds.  Crying or laughing hard.  Stress.  Certain medicines or drugs.  Foods, such as dried fruit, potato chips, and sparkling grape juice.  Infections or conditions (colds, flu).  Exercise.  Certain medical conditions or diseases.  Exercise or tiring activities.  Follow these instructions at home:  Take medicine as told by your doctor.  Use a peak flow meter as told by your doctor. A peak flow meter is a tool that measures how well the lungs are working.  Record and keep track of the peak flow meter's readings.  Understand and use the asthma action plan. An asthma action plan is a written plan for taking care of your asthma and treating your attacks.  To help prevent asthma attacks: ? Do not smoke. Stay away from secondhand smoke. ? Change your heating and air conditioning filter often. ? Limit your use of fireplaces and wood stoves. ? Get rid of pests (such as roaches and mice) and their droppings. ? Throw away plants if you see mold on them. ? Clean your floors. Dust regularly. Use cleaning products that do not smell. ? Have someone vacuum when you are not home. Use a vacuum cleaner with a HEPA filter if possible. ? Replace carpet with wood, tile, or vinyl flooring. Carpet can trap animal skin flakes and dust. ? Use allergy-proof pillows, mattress covers, and box spring covers. ? Wash bed sheets and blankets every week in hot water and dry them in a dryer. ? Use blankets that are made of polyester or cotton. ? Clean bathrooms  and kitchens with bleach. If possible, have someone repaint the walls in these rooms with mold-resistant paint. Keep out of the rooms that are being cleaned and painted. ? Wash hands often. Contact a doctor if:  You have make a whistling sound when breaking (wheeze), have shortness of breath, or have a cough even if taking medicine to prevent attacks.  The colored mucus you cough up (sputum) is thicker than usual.  The colored mucus you cough up changes from clear or white to yellow, green, gray, or bloody.  You have problems from the medicine you are taking such as: ? A rash. ? Itching. ? Swelling. ? Trouble breathing.  You need reliever medicines more than 2-3 times a week.  Your peak flow measurement is still at 50-79% of your personal best after following the action plan for 1 hour.  You have a fever. Get help right away if:  You seem to be worse and are not responding to medicine during an asthma attack.  You are short of breath even at rest.  You get short of breath when doing very little activity.  You have trouble eating, drinking, or talking.  You have chest pain.  You have a fast heartbeat.  Your lips or fingernails start to turn blue.  You are light-headed, dizzy, or faint.  Your peak flow is less than 50% of your personal best. This information is not intended to replace advice given to you by your health care provider. Make   sure you discuss any questions you have with your health care provider. Document Released: 06/29/2007 Document Revised: 06/18/2015 Document Reviewed: 08/09/2012 Elsevier Interactive Patient Education  2017 [employer] Acute Bronchitis, Adult Acute bronchitis is when air tubes (bronchi) in the lungs suddenly get swollen. The condition can make it hard to breathe. It can also cause these symptoms:  A cough.  Coughing up clear, yellow, or green mucus.  Wheezing.  Chest congestion.  Shortness of breath.  A fever.  Body  aches.  Chills.  A sore throat.  Follow these instructions at home: Medicines  Take over-the-counter and prescription medicines only as told by your doctor.  If you were prescribed an antibiotic medicine, take it as told by your doctor. Do not stop taking the antibiotic even if you start to feel better. General instructions  Rest.  Drink enough fluids to keep your pee (urine) clear or pale yellow.  Avoid smoking and secondhand smoke. If you smoke and you need help quitting, ask your doctor. Quitting will help your lungs heal faster.  Use an inhaler, cool mist vaporizer, or humidifier as told by your doctor.  Keep all follow-up visits as told by your doctor. This is important. How is this prevented? To lower your risk of getting this condition again:  Wash your hands often with soap and water. If you cannot use soap and water, use hand sanitizer.  Avoid contact with people who have cold symptoms.  Try not to touch your hands to your mouth, nose, or eyes.  Make sure to get the flu shot every year.  Contact a doctor if:  Your symptoms do not get better in 2 weeks. Get help right away if:  You cough up blood.  You have chest pain.  You have very bad shortness of breath.  You become dehydrated.  You faint (pass out) or keep feeling like you are going to pass out.  You keep throwing up (vomiting).  You have a very bad headache.  Your fever or chills gets worse. This information is not intended to replace advice given to you by your health care provider. Make sure you discuss any questions you have with your health care provider. Document Released: 06/29/2007 Document Revised: 08/19/2015 Document Reviewed: 07/01/2015 Elsevier Interactive Patient Education  [employer].

## 2017-12-01 ENCOUNTER — Ambulatory Visit: Payer: BLUE CROSS/BLUE SHIELD | Admitting: Family Medicine

## 2017-12-05 ENCOUNTER — Other Ambulatory Visit: Payer: Self-pay | Admitting: Medical

## 2017-12-05 ENCOUNTER — Other Ambulatory Visit: Payer: Self-pay | Admitting: Family Medicine

## 2017-12-05 DIAGNOSIS — J4540 Moderate persistent asthma, uncomplicated: Secondary | ICD-10-CM

## 2017-12-05 DIAGNOSIS — B001 Herpesviral vesicular dermatitis: Secondary | ICD-10-CM

## 2017-12-05 MED ORDER — FLUTICASONE-SALMETEROL 250-50 MCG/DOSE IN AEPB: 1.0000 | INHALATION_SPRAY | Freq: Two times a day (BID) | RESPIRATORY_TRACT | 0 refills | Status: DC

## 2017-12-05 MED ORDER — VALACYCLOVIR HCL 1 G PO TABS: ORAL_TABLET | ORAL | 0 refills | Status: DC

## 2017-12-05 NOTE — Telephone Encounter (Signed)
Patient needs a refill prescription on her Valtrex for a cold sore that showed up on Sunday between nose and upper lip. Having tingling to upper lip.  Meds ordered this encounter  Medications  . valACYclovir (VALTREX) 1000 MG tablet    Sig: Take    2000mg  ( 2 tablets) every 12 hours for 2 doses.    Dispense:  4 tablet    Refill:  0  contact the office if you have any further concerns. Patient verbalizes understanding and has no questions at the end of the email conversation.

## 2017-12-07 ENCOUNTER — Other Ambulatory Visit: Payer: Self-pay | Admitting: Family Medicine

## 2017-12-07 DIAGNOSIS — J4540 Moderate persistent asthma, uncomplicated: Secondary | ICD-10-CM

## 2017-12-20 DIAGNOSIS — M9902 Segmental and somatic dysfunction of thoracic region: Secondary | ICD-10-CM | POA: Diagnosis not present

## 2017-12-20 DIAGNOSIS — M531 Cervicobrachial syndrome: Secondary | ICD-10-CM | POA: Diagnosis not present

## 2017-12-20 DIAGNOSIS — M9901 Segmental and somatic dysfunction of cervical region: Secondary | ICD-10-CM | POA: Diagnosis not present

## 2017-12-20 DIAGNOSIS — M530 Cervicocranial syndrome: Secondary | ICD-10-CM | POA: Diagnosis not present

## 2018-01-05 ENCOUNTER — Other Ambulatory Visit: Payer: Self-pay | Admitting: Family Medicine

## 2018-01-05 DIAGNOSIS — J4540 Moderate persistent asthma, uncomplicated: Secondary | ICD-10-CM

## 2018-01-11 DIAGNOSIS — M9901 Segmental and somatic dysfunction of cervical region: Secondary | ICD-10-CM | POA: Diagnosis not present

## 2018-01-11 DIAGNOSIS — M531 Cervicobrachial syndrome: Secondary | ICD-10-CM | POA: Diagnosis not present

## 2018-01-11 DIAGNOSIS — M530 Cervicocranial syndrome: Secondary | ICD-10-CM | POA: Diagnosis not present

## 2018-01-11 DIAGNOSIS — M9902 Segmental and somatic dysfunction of thoracic region: Secondary | ICD-10-CM | POA: Diagnosis not present

## 2018-01-12 ENCOUNTER — Ambulatory Visit: Payer: BLUE CROSS/BLUE SHIELD | Admitting: Family Medicine

## 2018-01-12 ENCOUNTER — Encounter: Payer: Self-pay | Admitting: Family Medicine

## 2018-01-12 VITALS — BP 110/70 | HR 70 | Temp 97.6°F | Ht 67.0 in | Wt 259.0 lb

## 2018-01-12 DIAGNOSIS — J4520 Mild intermittent asthma, uncomplicated: Secondary | ICD-10-CM | POA: Diagnosis not present

## 2018-01-12 DIAGNOSIS — H02843 Edema of right eye, unspecified eyelid: Secondary | ICD-10-CM | POA: Diagnosis not present

## 2018-01-12 NOTE — Progress Notes (Signed)
Subjective:    Patient ID: Sonia Martinez, female    DOB: 1980/06/27, 37 y.o.   MRN: [id number]  HPI This is a 37 yo female who presents today with swelling of upper, inner corner of right eye x 5 days. Does not recall any trauma, area is red and itchy. A little worse over last 2 days.  No new soaps, shampoos, lotion, makeup. No nasal drainage, sinus pressure.   History of asthma, increased Advair dicuus to 250/50 11/03/17. Significant improvement, has not needed rescue inhaler.   Past Medical History:  Diagnosis Date  . Asthma    WELL CONTROLLED  . Fatty liver   . Folliculitis 03/31/2016  . GERD (gastroesophageal reflux disease)   . Herpes simplex virus (HSV) infection 03/23/2009   Overview:  Herpes Simplex  10/1 IMO update  . History of methicillin resistant staphylococcus aureus (MRSA) 2013   + PCR SCREEN  . IBS (irritable bowel syndrome)   . Migraine without aura, without mention of intractable migraine without mention of status migrainosus 06/05/2013  . Obesity   . Oligohydramnios    2013  . Pre-eclampsia    2013  . Preeclampsia 01/05/2016  . Pregnancy induced hypertension   . Wrist joint pain 01/09/2017   Past Surgical History:  Procedure Laterality Date  . CHOLECYSTECTOMY N/A 02/24/2017   Procedure: LAPAROSCOPIC CHOLECYSTECTOMY;  Surgeon: Leafy RoPabon, Diego F, MD;  Location: [facility] ORS;  Service: General;  Laterality: N/A;  . LIVER BIOPSY N/A 02/24/2017   Procedure: LIVER BIOPSY;  Surgeon: Leafy RoPabon, Diego F, MD;  Location: [facility] ORS;  Service: General;  Laterality: N/A;  . SEPTOPLASTY    . WISDOM TOOTH EXTRACTION     Family History  Problem Relation Age of Onset  . Hypertension Mother   . Cancer - Other Mother        uterine  . Arthritis Mother   . Heart disease Maternal Grandfather   . Hyperlipidemia Maternal Grandfather   . Hypertension Maternal Grandfather   . Diabetes Paternal Grandmother   . Hyperlipidemia Paternal Grandmother   . Stroke Paternal Grandmother   .  Hypertension Paternal Grandmother   . Lung cancer Paternal Grandfather   . Diabetes Paternal Grandfather   . Cancer - Other Paternal Grandfather        lung  . Hyperlipidemia Paternal Grandfather   . Hypertension Paternal Grandfather   . Hypertension Father   . Arthritis Maternal Grandmother   . Hyperlipidemia Maternal Grandmother   . Stroke Maternal Grandmother   . Hypertension Maternal Grandmother   . Anesthesia problems Neg Hx   . Malignant hyperthermia Neg Hx   . Pseudochol deficiency Neg Hx   . Hypotension Neg Hx   . Migraines Neg Hx    Social History   Tobacco Use  . Smoking status: Never Smoker  . Smokeless tobacco: Never Used  Substance Use Topics  . Alcohol use: No  . Drug use: No      Review of Systems Per HPI    Objective:   Physical Exam Vitals signs reviewed.  Constitutional:      General: She is not in acute distress.    Appearance: Normal appearance. She is obese. She is not ill-appearing or toxic-appearing.  HENT:     Head: Normocephalic and atraumatic.     Nose: No congestion or rhinorrhea.     Right Sinus: No maxillary sinus tenderness or frontal sinus tenderness.     Left Sinus: No maxillary sinus tenderness or frontal sinus tenderness.  Eyes:     Conjunctiva/sclera: Conjunctivae normal.   Neurological:     Mental Status: She is alert.       BP 110/70 (BP Location: Right Arm, Patient Position: Sitting, Cuff Size: Large)   Pulse 70   Temp 97.6 F (36.4 C) (Oral)   Ht 5\' 7"  (1.702 m)   Wt 259 lb (117.5 kg)   LMP 12/22/2017   SpO2 100%   BMI 40.57 kg/m  Wt Readings from Last 3 Encounters:  01/12/18 259 lb (117.5 kg)  11/30/17 261 lb (118.4 kg)  10/20/17 259 lb (117.5 kg)       Assessment & Plan:  1. Swelling of right eyelid - unsure of cause, seems irritant in nature with itching -  Patient Instructions  Good to see you today  Apply cool compresses, can try a little benadryl cream and nighttime benadryl  Keep clean and  dry  If worsening, redness, swelling, pain or drainage- go to Urgent Care    2. Mild intermittent asthma without complication - improved significantly, continue current treatment   Olean Reeeborah , FNP-BC  [facility] [place], [facility]  01/12/2018 10:39 AM

## 2018-01-12 NOTE — Patient Instructions (Signed)
Good to see you today  Apply cool compresses, can try a little benadryl cream and nighttime benadryl  Keep clean and dry  If worsening, redness, swelling, pain or drainage- go to Urgent Care

## 2018-01-15 ENCOUNTER — Other Ambulatory Visit: Payer: Self-pay | Admitting: Family Medicine

## 2018-01-23 ENCOUNTER — Telehealth: Payer: BLUE CROSS/BLUE SHIELD | Admitting: Physician Assistant

## 2018-01-23 DIAGNOSIS — B9689 Other specified bacterial agents as the cause of diseases classified elsewhere: Secondary | ICD-10-CM

## 2018-01-23 DIAGNOSIS — J45998 Other asthma: Secondary | ICD-10-CM

## 2018-01-23 DIAGNOSIS — J208 Acute bronchitis due to other specified organisms: Secondary | ICD-10-CM

## 2018-01-23 MED ORDER — AZITHROMYCIN 250 MG PO TABS: ORAL_TABLET | ORAL | 0 refills | Status: AC

## 2018-01-23 MED ORDER — PREDNISONE 20 MG PO TABS: 40.0000 mg | ORAL_TABLET | Freq: Every day | ORAL | 0 refills | Status: DC

## 2018-01-23 NOTE — Progress Notes (Signed)
We are sorry that you are not feeling well.  Here is how we plan to help!  Based on your presentation I believe you most likely have A cough due to bacteria.  When patients have a fever and a productive cough with a change in color or increased sputum production, we are concerned about bacterial bronchitis.  If left untreated it can progress to pneumonia.  If your symptoms do not improve with your treatment plan it is important that you contact your provider.   I have prescribed Azithromyin 250 mg: two tablets now and then one tablet daily for 4 additonal days   Given your history of asthma I am prescribing Prednisone 40 mg in the morning for five days.   From your responses in the eVisit questionnaire you describe inflammation in the upper respiratory tract which is causing a significant cough.  This is commonly called Bronchitis and has four common causes:   Allergies Viral Infections Acid Reflux Bacterial Infection Allergies, viruses and acid reflux are treated by controlling symptoms or eliminating the cause. An example might be a cough caused by taking certain blood pressure medications. You stop the cough by changing the medication. Another example might be a cough caused by acid reflux. Controlling the reflux helps control the cough.  USE OF BRONCHODILATOR ("RESCUE") INHALERS: There is a risk from using your bronchodilator too frequently.  The risk is that over-reliance on a medication which only relaxes the muscles surrounding the breathing tubes can reduce the effectiveness of medications prescribed to reduce swelling and congestion of the tubes themselves.  Although you feel brief relief from the bronchodilator inhaler, your asthma may actually be worsening with the tubes becoming more swollen and filled with mucus.  This can delay other crucial treatments, such as oral steroid medications. If you need to use a bronchodilator inhaler daily, several times per day, you should discuss this with  your provider.  There are probably better treatments that could be used to keep your asthma under control.     HOME CARE Only take medications as instructed by your medical team. Complete the entire course of an antibiotic. Drink plenty of fluids and get plenty of rest. Avoid close contacts especially the very young and the elderly Cover your mouth if you cough or cough into your sleeve. Always remember to wash your hands A steam or ultrasonic humidifier can help congestion.   GET HELP RIGHT AWAY IF: You develop worsening fever. You become short of breath You cough up blood. Your symptoms persist after you have completed your treatment plan MAKE SURE YOU  Understand these instructions. Will watch your condition. Will get help right away if you are not doing well or get worse.  Your e-visit answers were reviewed by a board certified advanced clinical practitioner to complete your personal care plan.  Depending on the condition, your plan could have included both over the counter or prescription medications. If there is a problem please reply once you have received a response from your provider. Your safety is important to us.  If you have drug allergies check your prescription carefully.    You can use MyChart to ask questions about today's visit, request a non-urgent call back, or ask for a work or school excuse for 24 hours related to this e-Visit. If it has been greater than 24 hours you will need to follow up with your provider, or enter a new e-Visit to address those concerns. You will get an e-mail in the  next two days asking about your experience.  I hope that your e-visit has been valuable and will speed your recovery. Thank you for using e-visits.   ===View-only below this line===   ----- Message -----    From: Gilman Schmidt    Sent: 01/23/2018  8:42 AM EST      To: E-Visit Mailing List Subject: E-Visit Submission: Cough  E-Visit Submission:  Cough --------------------------------  Question: How long have you been coughing? Answer:   4 days  Question: How would you describe the cough? Answer:   A cough from congested lungs  Question: How often are you coughing? Answer:   Infrequently but steadily  Question: Does the cough prevent you from sleeping at night? Answer:   Yes  Question: What other symptoms have you experienced with the cough? Answer:   Sore throat            Headache            Wheezing  Question: Do you have a fever? Answer:   No, I do not have a fever  Question: Describe your sore throat: Answer:   Throat is irritated because of drainage and coughing  Question: How long have you had a sore throat? Answer:   3 days  Question: Do you have any tenderness or swelling in your neck? Answer:   No  Question: Are you coughing up any mucus? Answer:   I am coughing up a little bit of mucus  Question: Do you use a maintenance inhaler? Answer:   Yes  Question: Do you use a rescue inhaler (such as Ventolin?) Answer:   Yes  Question: Have you previously required a prescription for prednisone for cough? Answer:   Yes  Question: Are you diabetic? Answer:   No  Question: Are you pregnant? Answer:   I am confident that I am not pregnant  Question: Are you breastfeeding? Answer:   No  Question: What is the appearance of the mucus? Answer:   The mucus has changed from clear to colored  Question: Do you have any of the following? Answer:   Fatigue  Question: Do you smoke? Answer:   No  Question: Have you ever smoked? Answer:   I have never smoked  Question: Are there people you know with similar symptoms? Answer:   Yes  Question: Are you experiencing any of the following? Answer:   None of  the above  Question: Are you having difficulty breathing? Answer:   Yes  Question: Please describe what kind of difficulty you are having breathing. Answer:   Chest is tight, using my rescue inhaler  helps  Question: Is your coughing worse when you are exposed to pollen, dust, or other things in the environment? Answer:   No  Question: Have you been treated for a similar cough in the past? Answer:   Yes  Question: What treatments have worked in the past?  Answer:   Zpac and taper dose of  prednisone  Question: What treatment(s) in the past have been unsuccessful? Answer:   Just a low dose non taper prednisone  Question: Have you ever been diagnosed with asthma, bronchitis, or lung disease? Answer:   Yes  Question: Please enter a few details about your earlier diagnosis and treatment Answer:   I have had asthma for many years and it is usually well controlled. I get bronchitis a few times a year and it always makes my asthma worse until I get better.  Question: Have  you recently started on any medications for your heart or for blood pressure? Answer:   No  Question: Have you recently been hospitalized? Answer:   No  Question: Please list your medication allergies that you may have ? (If 'none' , please list as 'none') Answer:   Cypro, dulera, hydrocortisone  Question: Please list any additional comments  Answer:

## 2018-01-31 ENCOUNTER — Other Ambulatory Visit: Payer: Self-pay | Admitting: Family Medicine

## 2018-01-31 DIAGNOSIS — J4540 Moderate persistent asthma, uncomplicated: Secondary | ICD-10-CM

## 2018-01-31 NOTE — Telephone Encounter (Signed)
Electronic refill request Advair Diskus Last refill 01/05/18 #60 Last office visit 01/12/18 acute See allergy/contraindication

## 2018-02-07 DIAGNOSIS — M530 Cervicocranial syndrome: Secondary | ICD-10-CM | POA: Diagnosis not present

## 2018-02-07 DIAGNOSIS — M9902 Segmental and somatic dysfunction of thoracic region: Secondary | ICD-10-CM | POA: Diagnosis not present

## 2018-02-07 DIAGNOSIS — M9901 Segmental and somatic dysfunction of cervical region: Secondary | ICD-10-CM | POA: Diagnosis not present

## 2018-02-07 DIAGNOSIS — M531 Cervicobrachial syndrome: Secondary | ICD-10-CM | POA: Diagnosis not present

## 2018-02-12 ENCOUNTER — Encounter: Payer: Self-pay | Admitting: Family Medicine

## 2018-02-12 ENCOUNTER — Other Ambulatory Visit: Payer: Self-pay | Admitting: Family Medicine

## 2018-02-12 DIAGNOSIS — B001 Herpesviral vesicular dermatitis: Secondary | ICD-10-CM

## 2018-02-12 MED ORDER — VALACYCLOVIR HCL 1 G PO TABS: ORAL_TABLET | ORAL | 1 refills | Status: DC

## 2018-02-13 NOTE — Progress Notes (Signed)
Patient declined recommendation.

## 2018-02-15 ENCOUNTER — Ambulatory Visit: Payer: Self-pay | Admitting: Medical

## 2018-02-15 ENCOUNTER — Encounter: Payer: Self-pay | Admitting: Medical

## 2018-02-15 VITALS — BP 110/76 | HR 82 | Temp 98.9°F | Resp 16 | Ht 68.0 in | Wt 259.0 lb

## 2018-02-15 DIAGNOSIS — R059 Cough, unspecified: Secondary | ICD-10-CM

## 2018-02-15 DIAGNOSIS — J069 Acute upper respiratory infection, unspecified: Secondary | ICD-10-CM

## 2018-02-15 DIAGNOSIS — R05 Cough: Secondary | ICD-10-CM

## 2018-02-15 MED ORDER — BENZONATATE 100 MG PO CAPS: 100.0000 mg | ORAL_CAPSULE | Freq: Three times a day (TID) | ORAL | 0 refills | Status: DC | PRN

## 2018-02-15 MED ORDER — AMOXICILLIN-POT CLAVULANATE 875-125 MG PO TABS: 1.0000 | ORAL_TABLET | Freq: Two times a day (BID) | ORAL | 0 refills | Status: DC

## 2018-02-15 NOTE — Progress Notes (Signed)
Subjective:    Patient ID: Sonia Martinez, female    DOB: 1980/04/05, 38 y.o.   MRN: [id number]  HPI 38 yo female in non acute distress.  Hx of Asthma. Bronchitis the week after Christmas treated with Z-pak and steroids. Thought she was getting a cold starting about 10 days with symptoms of  Runny nose, dry cough, no fever or chills.Now with chest congestion. Feeling tight in the chest.since Monday. Has heard wheezing. Last does of Albuterol l MDI ast week.   History of Bronchitisi. Ususally gets it 3-4 times per year.  Has a 36 yo which was diagnoied with otitis media  on Monday with mild cough. And also has a  417 yo child .   Blood pressure 110/76, pulse 82, temperature 98.9 F (37.2 C), resp. rate 16, height 5\' 8"  (1.727 m), weight 259 lb (117.5 kg), last menstrual period 02/05/2018, SpO2 97 %, unknown if currently breastfeeding. Allergies  Allergen Reactions  . Coconut Oil Hives    ANYTHING WITH COCONUT IN IT  . Dulera [Mometasone Furo-Formoterol Fum]     Patient can only take advair  . Ciprofloxacin Hives and Rash  . Hydrocortisone Hives and Rash    Review of Systems  Constitutional: Positive for fatigue. Negative for chills and fever.  HENT: Positive for congestion, postnasal drip, rhinorrhea, sinus pressure (under eyes) and sore throat. Negative for ear pain.   Eyes: Negative for discharge and itching.  Respiratory: Positive for cough (productive yellow), chest tightness and shortness of breath (improved  the week after christmas).   Cardiovascular: Negative for chest pain.  Gastrointestinal: Negative for abdominal pain, diarrhea, nausea and vomiting.  Endocrine: Negative for polydipsia, polyphagia and polyuria.  Genitourinary: Negative for dysuria.  Musculoskeletal: Positive for neck pain (around shoulders "I hunch my back at the computer"). Negative for myalgias.  Skin: Negative for rash.  Allergic/Immunologic: Positive for environmental allergies. Negative for  food allergies.  Neurological: Negative for dizziness, syncope and light-headedness.  Hematological: Negative for adenopathy.  Psychiatric/Behavioral: Negative for behavioral problems, self-injury and suicidal ideas.       Objective:   Physical Exam Vitals signs and nursing note reviewed.  Constitutional:      Appearance: Normal appearance. She is obese.  HENT:     Head: Normocephalic and atraumatic.     Right Ear: Ear canal and external ear normal.     Left Ear: Ear canal and external ear normal.     Nose: Nose normal. No congestion or rhinorrhea.     Mouth/Throat:     Mouth: Mucous membranes are moist.     Pharynx: Posterior oropharyngeal erythema present. No oropharyngeal exudate.  Eyes:     Extraocular Movements: Extraocular movements intact.     Conjunctiva/sclera: Conjunctivae normal.     Pupils: Pupils are equal, round, and reactive to light.  Neck:     Musculoskeletal: Normal range of motion and neck supple.  Cardiovascular:     Rate and Rhythm: Normal rate and regular rhythm.     Pulses: Normal pulses.  Pulmonary:     Effort: Pulmonary effort is normal. No respiratory distress.     Breath sounds: Normal breath sounds. No stridor. No wheezing, rhonchi or rales.  Musculoskeletal: Normal range of motion.  Lymphadenopathy:     Cervical: No cervical adenopathy.  Skin:    General: Skin is warm and dry.  Neurological:     General: No focal deficit present.     Mental Status: She is alert and oriented  to person, place, and time.  Psychiatric:        Mood and Affect: Mood normal.        Behavior: Behavior normal.        Thought Content: Thought content normal.        Judgment: Judgment normal.     Coughing in room      Assessment & Plan:  Upper Respiratory Infection/ Cough Meds ordered this encounter  Medications  . amoxicillin-clavulanate (AUGMENTIN) 875-125 MG tablet    Sig: Take 1 tablet by mouth 2 (two) times daily.    Dispense:  20 tablet    Refill:  0   . benzonatate (TESSALON PERLES) 100 MG capsule    Sig: Take 1 capsule (100 mg total) by mouth 3 (three) times daily as needed.    Dispense:  30 capsule    Refill:  0  follow up with your PCP for fatigue or if URI  not improving. lto use Albuterol MDi as prescribed..   Patient declines AVS , she verbalizes understanding and has no questions at discharge.

## 2018-02-20 ENCOUNTER — Other Ambulatory Visit: Payer: Self-pay | Admitting: Medical

## 2018-02-20 ENCOUNTER — Encounter: Payer: Self-pay | Admitting: Medical

## 2018-02-20 DIAGNOSIS — B373 Candidiasis of vulva and vagina: Secondary | ICD-10-CM

## 2018-02-20 DIAGNOSIS — B3731 Acute candidiasis of vulva and vagina: Secondary | ICD-10-CM

## 2018-02-20 MED ORDER — FLUCONAZOLE 150 MG PO TABS: 150.0000 mg | ORAL_TABLET | Freq: Once | ORAL | 0 refills | Status: AC

## 2018-03-01 ENCOUNTER — Ambulatory Visit: Payer: BLUE CROSS/BLUE SHIELD | Admitting: Internal Medicine

## 2018-03-01 ENCOUNTER — Ambulatory Visit (INDEPENDENT_AMBULATORY_CARE_PROVIDER_SITE_OTHER)
Admission: RE | Admit: 2018-03-01 | Discharge: 2018-03-01 | Disposition: A | Discharge status: Home / Self Care | Payer: BLUE CROSS/BLUE SHIELD | Source: Ambulatory Visit | Attending: Internal Medicine | Admitting: Internal Medicine

## 2018-03-01 ENCOUNTER — Encounter: Payer: Self-pay | Admitting: Internal Medicine

## 2018-03-01 VITALS — BP 114/72 | HR 101 | Temp 98.2°F | Wt 258.0 lb

## 2018-03-01 DIAGNOSIS — R059 Cough, unspecified: Secondary | ICD-10-CM

## 2018-03-01 DIAGNOSIS — R05 Cough: Secondary | ICD-10-CM | POA: Diagnosis not present

## 2018-03-01 DIAGNOSIS — J4541 Moderate persistent asthma with (acute) exacerbation: Secondary | ICD-10-CM

## 2018-03-01 MED ORDER — PREDNISONE 10 MG PO TABS: ORAL_TABLET | ORAL | 0 refills | Status: DC

## 2018-03-01 NOTE — Progress Notes (Signed)
HPI  Pt presents to the clinic today with c/o runny nose, cough and wheezing. This started 2 weeks ago. She is blowing clear mucous out of her nose. The cough is productive of yellow mucous. She denies nasal congestion, ear pain, sore throat or shortness of breath. She denies fever, chills or body aches.  She has a history of asthma and takes Singulair and Advair as prescribed. She uses Albuterol as needed but feels like it has not been helping. She was seen 11/08/17, 01/23/18 and 02/15/18 for similar symptoms, treated with multiple rounds of steroid and antibiotics. She finished her last round of abx 4 days ago.  Review of Systems      Past Medical History:  Diagnosis Date  . Asthma    WELL CONTROLLED  . Fatty liver   . Folliculitis 03/31/2016  . GERD (gastroesophageal reflux disease)   . Herpes simplex virus (HSV) infection 03/23/2009   Overview:  Herpes Simplex  10/1 IMO update  . History of methicillin resistant staphylococcus aureus (MRSA) 2013   + PCR SCREEN  . IBS (irritable bowel syndrome)   . Migraine without aura, without mention of intractable migraine without mention of status migrainosus 06/05/2013  . Obesity   . Oligohydramnios    2013  . Pre-eclampsia    2013  . Preeclampsia 01/05/2016  . Pregnancy induced hypertension   . Wrist joint pain 01/09/2017    Family History  Problem Relation Age of Onset  . Hypertension Mother   . Cancer - Other Mother        uterine  . Arthritis Mother   . Heart disease Maternal Grandfather   . Hyperlipidemia Maternal Grandfather   . Hypertension Maternal Grandfather   . Diabetes Paternal Grandmother   . Hyperlipidemia Paternal Grandmother   . Stroke Paternal Grandmother   . Hypertension Paternal Grandmother   . Lung cancer Paternal Grandfather   . Diabetes Paternal Grandfather   . Cancer - Other Paternal Grandfather        lung  . Hyperlipidemia Paternal Grandfather   . Hypertension Paternal Grandfather   . Hypertension Father    . Arthritis Maternal Grandmother   . Hyperlipidemia Maternal Grandmother   . Stroke Maternal Grandmother   . Hypertension Maternal Grandmother   . Anesthesia problems Neg Hx   . Malignant hyperthermia Neg Hx   . Pseudochol deficiency Neg Hx   . Hypotension Neg Hx   . Migraines Neg Hx     Social History   Socioeconomic History  . Marital status: Married    Spouse name: Not on file  . Number of children: 1  . Years of education: MA  . Highest education level: Not on file  Occupational History  . Occupation: Estate manager/land agent    Comment: [employer]  Social Needs  . Financial resource strain: Not on file  . Food insecurity:    Worry: Not on file    Inability: Not on file  . Transportation needs:    Medical: Not on file    Non-medical: Not on file  Tobacco Use  . Smoking status: Never Smoker  . Smokeless tobacco: Never Used  Substance and Sexual Activity  . Alcohol use: No  . Drug use: No  . Sexual activity: Yes  Lifestyle  . Physical activity:    Days per week: Not on file    Minutes per session: Not on file  . Stress: Not on file  Relationships  . Social connections:    Talks on  phone: Not on file    Gets together: Not on file    Attends religious service: Not on file    Active member of club or organization: Not on file    Attends meetings of clubs or organizations: Not on file    Relationship status: Not on file  . Intimate partner violence:    Fear of current or ex partner: Not on file    Emotionally abused: Not on file    Physically abused: Not on file    Forced sexual activity: Not on file  Other Topics Concern  . 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   Husband is Emergency planning/management officerpolice officer.   She enjoys cooking and reading.    No regular exercise.    Patient is right handed.   Patient has a Master's degree.   Patient drinks 2 cups daily.     Allergies  Allergen Reactions  . Coconut Oil Hives    ANYTHING WITH COCONUT IN IT  . Dulera [Mometasone Furo-Formoterol Fum]     Patient can only take advair  . Ciprofloxacin Hives and Rash  . Hydrocortisone Hives and Rash     Constitutional: Denies headache, fatigue, fever or abrupt weight changes.  HEENT:  Positive runny nose, nasal congestion. Denies eye redness, eye pain, pressure behind the eyes, facial pain, ear pain, ringing in the ears, wax buildup, or bloody nose. Respiratory: Positive cough and wheezing. Denies difficulty breathing or shortness of breath.  Cardiovascular: Denies chest pain, chest tightness, palpitations or swelling in the hands or feet.   No other specific complaints in a complete review of systems (except as listed in HPI above).  Objective:   LMP 02/05/2018  Wt Readings from Last 3 Encounters:  02/15/18 259 lb (117.5 kg)  01/12/18 259 lb (117.5 kg)  11/30/17 261 lb (118.4 kg)     General: Appears her stated age, obese, in NAD. HEENT: Head: normal shape and size, no sinus tenderness noted; Ears: Tm's gray and intact, normal light reflex; Nose: mucosa pink and moist, septum midline; Throat/Mouth: + PND. Teeth present, mucosa erythematous and moist, no exudate noted, no lesions or ulcerations noted.  Neck: No cervical lymphadenopathy.  Cardiovascular: Normal rate and rhythm. S1,S2 noted.  No murmur, rubs or gallops noted.  Pulmonary/Chest: Normal effort and positive vesicular breath sounds. No respiratory distress. No wheezes, rales or ronchi noted.       Assessment & Plan:   Asthma Exacerbation, Cough:  Get some rest and drink plenty of water Given ongoing symptoms, will obtain chest xray today RX for Pred Taper x 6 days No indication for additional abx at this time Referral to pulmonology for uncontrolled asthma  RTC as needed or if symptoms persist.   Nicki Reaperegina Baity, NP

## 2018-03-01 NOTE — Patient Instructions (Signed)
Asthma, Adult    Asthma is a long-term (chronic) condition in which the airways get tight and narrow. The airways are the breathing passages that lead from the nose and mouth down into the lungs. A person with asthma will have times when symptoms get worse. These are called asthma attacks. They can cause coughing, whistling sounds when you breathe (wheezing), shortness of breath, and chest pain. They can make it hard to breathe. There is no cure for asthma, but medicines and lifestyle changes can help control it.  There are many things that can bring on an asthma attack or make asthma symptoms worse (triggers). Common triggers include:  · Mold.  · Dust.  · Cigarette smoke.  · Cockroaches.  · Things that can cause allergy symptoms (allergens). These include animal skin flakes (dander) and pollen from trees or grass.  · Things that pollute the air. These may include household cleaners, wood smoke, smog, or chemical odors.  · Cold air, weather changes, and wind.  · Crying or laughing hard.  · Stress.  · Certain medicines or drugs.  · Certain foods such as dried fruit, potato chips, and grape juice.  · Infections, such as a cold or the flu.  · Certain medical conditions or diseases.  · Exercise or tiring activities.  Asthma may be treated with medicines and by staying away from the things that cause asthma attacks. Types of medicines may include:  · Controller medicines. These help prevent asthma symptoms. They are usually taken every day.  · Fast-acting reliever or rescue medicines. These quickly relieve asthma symptoms. They are used as needed and provide short-term relief.  · Allergy medicines if your attacks are brought on by allergens.  · Medicines to help control the body's defense (immune) system.  Follow these instructions at home:  Avoiding triggers in your home  · Change your heating and air conditioning filter often.  · Limit your use of fireplaces and wood stoves.  · Get rid of pests (such as roaches and  mice) and their droppings.  · Throw away plants if you see mold on them.  · Clean your floors. Dust regularly. Use cleaning products that do not smell.  · Have someone vacuum when you are not home. Use a vacuum cleaner with a HEPA filter if possible.  · Replace carpet with wood, tile, or vinyl flooring. Carpet can trap animal skin flakes and dust.  · Use allergy-proof pillows, mattress covers, and box spring covers.  · Wash bed sheets and blankets every week in hot water. Dry them in a dryer.  · Keep your bedroom free of any triggers.   · Avoid pets and keep windows closed when things that cause allergy symptoms are in the air.  · Use blankets that are made of polyester or cotton.  · Clean bathrooms and kitchens with bleach. If possible, have someone repaint the walls in these rooms with mold-resistant paint. Keep out of the rooms that are being cleaned and painted.  · Wash your hands often with soap and water. If soap and water are not available, use hand sanitizer.  · Do not allow anyone to smoke in your home.  General instructions  · Take over-the-counter and prescription medicines only as told by your doctor.  ? Talk with your doctor if you have questions about how or when to take your medicines.  ? Make note if you need to use your medicines more often than usual.  · Do not use any products that   contain nicotine or tobacco, such as cigarettes and e-cigarettes. If you need help quitting, ask your doctor.  · Stay away from secondhand smoke.  · Avoid doing things outdoors when allergen counts are high and when air quality is low.  · Wear a ski mask when doing outdoor activities in the winter. The mask should cover your nose and mouth. Exercise indoors on cold days if you can.  · Warm up before you exercise. Take time to cool down after exercise.  · Use a peak flow meter as told by your doctor. A peak flow meter is a tool that measures how well the lungs are working.  · Keep track of the peak flow meter's readings.  Write them down.  · Follow your asthma action plan. This is a written plan for taking care of your asthma and treating your attacks.  · Make sure you get all the shots (vaccines) that your doctor recommends. Ask your doctor about a flu shot and a pneumonia shot.  · Keep all follow-up visits as told by your doctor. This is important.  Contact a doctor if:  · You have wheezing, shortness of breath, or a cough even while taking medicine to prevent attacks.  · The mucus you cough up (sputum) is thicker than usual.  · The mucus you cough up changes from clear or white to yellow, green, gray, or bloody.  · You have problems from the medicine you are taking, such as:  ? A rash.  ? Itching.  ? Swelling.  ? Trouble breathing.  · You need reliever medicines more than 2-3 times a week.  · Your peak flow reading is still at 50-79% of your personal best after following the action plan for 1 hour.  · You have a fever.  Get help right away if:  · You seem to be worse and are not responding to medicine during an asthma attack.  · You are short of breath even at rest.  · You get short of breath when doing very little activity.  · You have trouble eating, drinking, or talking.  · You have chest pain or tightness.  · You have a fast heartbeat.  · Your lips or fingernails start to turn blue.  · You are light-headed or dizzy, or you faint.  · Your peak flow is less than 50% of your personal best.  · You feel too tired to breathe normally.  Summary  · Asthma is a long-term (chronic) condition in which the airways get tight and narrow. An asthma attack can make it hard to breathe.  · Asthma cannot be cured, but medicines and lifestyle changes can help control it.  · Make sure you understand how to avoid triggers and how and when to use your medicines.  This information is not intended to replace advice given to you by your health care provider. Make sure you discuss any questions you have with your health care provider.  Document  Released: 06/29/2007 Document Revised: 02/15/2016 Document Reviewed: 02/15/2016  Elsevier Interactive Patient Education © 2019 [employer]

## 2018-03-02 ENCOUNTER — Other Ambulatory Visit: Payer: Self-pay | Admitting: Internal Medicine

## 2018-03-02 MED ORDER — DOXYCYCLINE HYCLATE 100 MG PO TABS: 100.0000 mg | ORAL_TABLET | Freq: Two times a day (BID) | ORAL | 0 refills | Status: DC

## 2018-03-04 ENCOUNTER — Other Ambulatory Visit: Payer: Self-pay | Admitting: Internal Medicine

## 2018-03-04 MED ORDER — FLUCONAZOLE 150 MG PO TABS: 150.0000 mg | ORAL_TABLET | Freq: Once | ORAL | 0 refills | Status: AC

## 2018-03-14 DIAGNOSIS — M530 Cervicocranial syndrome: Secondary | ICD-10-CM | POA: Diagnosis not present

## 2018-03-14 DIAGNOSIS — M531 Cervicobrachial syndrome: Secondary | ICD-10-CM | POA: Diagnosis not present

## 2018-03-14 DIAGNOSIS — M9902 Segmental and somatic dysfunction of thoracic region: Secondary | ICD-10-CM | POA: Diagnosis not present

## 2018-03-14 DIAGNOSIS — M9901 Segmental and somatic dysfunction of cervical region: Secondary | ICD-10-CM | POA: Diagnosis not present

## 2018-03-15 ENCOUNTER — Ambulatory Visit: Payer: BLUE CROSS/BLUE SHIELD | Admitting: Internal Medicine

## 2018-03-15 ENCOUNTER — Encounter: Payer: Self-pay | Admitting: Internal Medicine

## 2018-03-15 VITALS — BP 112/80 | HR 78 | Resp 16 | Ht 68.0 in | Wt 261.0 lb

## 2018-03-15 DIAGNOSIS — J4520 Mild intermittent asthma, uncomplicated: Secondary | ICD-10-CM | POA: Diagnosis not present

## 2018-03-15 DIAGNOSIS — R059 Cough, unspecified: Secondary | ICD-10-CM

## 2018-03-15 DIAGNOSIS — R05 Cough: Secondary | ICD-10-CM | POA: Diagnosis not present

## 2018-03-15 MED ORDER — TIOTROPIUM BROMIDE MONOHYDRATE 1.25 MCG/ACT IN AERS: 2.0000 | INHALATION_SPRAY | Freq: Every day | RESPIRATORY_TRACT | 5 refills | Status: DC

## 2018-03-15 MED ORDER — TIOTROPIUM BROMIDE MONOHYDRATE 1.25 MCG/ACT IN AERS: 2.0000 | INHALATION_SPRAY | Freq: Two times a day (BID) | RESPIRATORY_TRACT | 5 refills | Status: DC

## 2018-03-15 MED ORDER — GUAIFENESIN-CODEINE 100-10 MG/5ML PO SOLN: 5.0000 mL | ORAL | 0 refills | Status: DC | PRN

## 2018-03-15 NOTE — Patient Instructions (Addendum)
Continue Advair as prescribed  ALbuterol as needed  Start Spiriva Respimat  Cough syrup as prescribed  Continue meds for allergies  Avoid triggers

## 2018-03-15 NOTE — Progress Notes (Signed)
Patient seen in the office today and instructed on use of Spiriva Respimat.  Patient expressed understanding and demonstrated technique.  

## 2018-03-15 NOTE — Addendum Note (Signed)
Addended by: Janean Sark on: 03/15/2018 11:52 AM   Modules accepted: Orders

## 2018-03-15 NOTE — Progress Notes (Signed)
Name: Gilman SchmidtKimberly S Nordmann MRN: [id number] DOB: 02-Jan-1981     CONSULTATION DATE: 2.20.20 REFERRING MD : Sampson Sibaity  CHIEF COMPLAINT: SOB  STUDIES:    2.6.20  CXR independently reviewed by Me No pneumonia No infiltrates No edema  NL chest radiograph   HISTORY OF PRESENT ILLNESS: 38 yo pleasant white female seen today for chest congestion and wheezing  Patient has a diagnosis of asthma-diagnosed 15 years ago Triggers include cold air and exercise Patient uses Advair Diskus 250/50 daily and states that this helps a lot Patient has a 36-year-old child is in daycare  Patient has had symptoms since October of wheezing chest congestion cough She has been prescribed several rounds of antibiotics and prednisone over the last several months  Patient was told she has had a diagnosis of pneumonia however on current chest x-ray in February there is no evidence of pneumonia  I have explained to patient that she could have had pneumonia in October and was treated appropriately  Spirometry at this time show any evidence of obstructive lung disease Normal ratio normal FEV1  At this time patient is not in any distress however does have some intermittent wheezing cough chest congestion nasal congestion  No active signs of infection No respiratory distress  Patient does have a history of reflux disease and has been treated with PPI For reflux seems to be under control  Patient weighs approximately 261 pounds   PAST MEDICAL HISTORY :   has a past medical history of Asthma, Fatty liver, Folliculitis (03/31/2016), GERD (gastroesophageal reflux disease), Herpes simplex virus (HSV) infection (03/23/2009), History of methicillin resistant staphylococcus aureus (MRSA) (2013), IBS (irritable bowel syndrome), Migraine without aura, without mention of intractable migraine without mention of status migrainosus (06/05/2013), Obesity, Oligohydramnios, Pre-eclampsia, Preeclampsia (01/05/2016), Pregnancy  induced hypertension, and Wrist joint pain (01/09/2017).  has a past surgical history that includes Wisdom tooth extraction; Septoplasty; Cholecystectomy (N/A, 02/24/2017); and Liver biopsy (N/A, 02/24/2017). Prior to Admission medications   Medication Sig Start Date End Date Taking? Authorizing Provider  ADVAIR DISKUS 250-50 MCG/DOSE AEPB INHALE ONE PUFF INTO THE LUNGS TWICE DAILY 01/31/18   Emi BelfastGessner, Deborah B, FNP  albuterol (PROAIR HFA) 108 (90 Base) MCG/ACT inhaler Inhale 2 puffs into the lungs every 4 (four) hours as needed for wheezing or shortness of breath. 10/20/17   Emi BelfastGessner, Deborah B, FNP  Ascorbic Acid (VITAMIN C) 1000 MG tablet Take 1,000 mg by mouth daily.    [provider]  Bacillus Coagulans-Inulin (PROBIOTIC-PREBIOTIC PO) Take 2 tablets by mouth daily.    [provider]  benzonatate (TESSALON PERLES) 100 MG capsule Take 1 capsule (100 mg total) by mouth 3 (three) times daily as needed. 02/15/18   Doy Minceatcliffe, Heather R, PA-C  Cholecalciferol 2000 units CAPS Take by mouth.    [provider]  docusate sodium (COLACE) 100 MG capsule Take 100 mg by mouth daily.    [provider]  doxycycline (VIBRA-TABS) 100 MG tablet Take 1 tablet (100 mg total) by mouth 2 (two) times daily. 03/02/18   Lorre MunroeBaity, Regina W, NP  fluticasone (FLONASE) 50 MCG/ACT nasal spray [facility] 11/08/17   Emi BelfastGessner, Deborah B, FNP  Fluticasone-Salmeterol (ADVAIR DISKUS) 250-50 MCG/DOSE AEPB Inhale 1 puff into the lungs 2 (two) times daily. 12/05/17   Emi BelfastGessner, Deborah B, FNP  levocetirizine Elita Boone(XYZAL ALLERGY 24HR) 5 MG tablet Take 1 tablet (5 mg total) by mouth every evening. 10/20/17   Emi BelfastGessner, Deborah B, FNP  loratadine (CLARITIN)  10 MG tablet Take 10 mg by mouth at bedtime.     [provider]  Lysine 500 MG CAPS Take 500 mg by mouth daily.    [provider]  Magnesium 400 MG CAPS Take by mouth.    [provider]  montelukast (SINGULAIR)  10 MG tablet TAKE 1 TABLET BY MOUTH ONCE A DAY 01/15/18   Emi Belfast, FNP  Multiple Vitamin (MULTIVITAMIN) tablet Take 1 tablet by mouth daily.    [provider]  Omega-3 Fatty Acids (FISH OIL) 1000 MG CAPS Take 1,000 mg by mouth daily.    [provider]  predniSONE (DELTASONE) 10 MG tablet Take 6 tabs day 1, 5 tabs day 2, 4 tabs day 3, 3 tabs day 4, 2 tabs day 5, 1 tab day 6 03/01/18   Lorre Munroe, NP  Pyridoxine HCl (B-6) 100 MG TABS Take 100 mg by mouth daily.    [provider]  valACYclovir (VALTREX) 1000 MG tablet Take    2000mg  ( 2 tablets) every 12 hours for 2 doses. 02/12/18   Joaquim Nam, MD  valACYclovir (VALTREX) 500 MG tablet Take as directed 09/02/14   [provider]   Allergies  Allergen Reactions  . Coconut Oil Hives    ANYTHING WITH COCONUT IN IT  . Dulera [Mometasone Furo-Formoterol Fum]     Patient can only take advair  . Ciprofloxacin Hives and Rash  . Hydrocortisone Hives and Rash    FAMILY HISTORY:  family history includes Arthritis in her maternal grandmother and mother; Cancer - Other in her mother and paternal grandfather; Diabetes in her paternal grandfather and paternal grandmother; Heart disease in her maternal grandfather; Hyperlipidemia in her maternal grandfather, maternal grandmother, paternal grandfather, and paternal grandmother; Hypertension in her father, maternal grandfather, maternal grandmother, mother, paternal grandfather, and paternal grandmother; Lung cancer in her paternal grandfather; Stroke in her maternal grandmother and paternal grandmother. SOCIAL HISTORY:  reports that she has never smoked. She has never used smokeless tobacco. She reports that she does not drink alcohol or use drugs.  REVIEW OF SYSTEMS:   Constitutional: Negative for fever, chills, weight loss, malaise/fatigue and diaphoresis.  HENT: Negative for hearing loss, ear pain, nosebleeds, congestion, sore throat, neck pain,  tinnitus and ear discharge.   Eyes: Negative for blurred vision, double vision, photophobia, pain, discharge and redness.  Respiratory: +cough,- hemoptysis, -sputum production, +shortness of breath, +wheezing and -stridor.   Cardiovascular: Negative for chest pain, palpitations, orthopnea, claudication, leg swelling and PND.  Gastrointestinal: Negative for heartburn, nausea, vomiting, abdominal pain, diarrhea, constipation, blood in stool and melena.  Genitourinary: Negative for dysuria, urgency, frequency, hematuria and flank pain.  Musculoskeletal: Negative for myalgias, back pain, joint pain and falls.  Skin: Negative for itching and rash.  Neurological: Negative for dizziness, tingling, tremors, sensory change, speech change, focal weakness, seizures, loss of consciousness, weakness and headaches.  Endo/Heme/Allergies: Negative for environmental allergies and polydipsia. Does not bruise/bleed easily.  ALL OTHER ROS ARE NEGATIVE   BP 112/80 (BP Location: Left Arm, Cuff Size: Large)   Pulse 78   Resp 16   Ht 5\' 8"  (1.727 m)   Wt 261 lb (118.4 kg)   SpO2 97%   BMI 39.68 kg/m   Physical Examination:   GENERAL:NAD, no fevers, chills, no weakness no fatigue HEAD: Normocephalic, atraumatic.  EYES: Pupils equal, round, reactive to light. Extraocular muscles intact. No scleral icterus.  MOUTH: Moist mucosal membrane.   EAR, NOSE, THROAT: Clear  without exudates. No external lesions.  NECK: Supple. No thyromegaly. No nodules. No JVD.  PULMONARY:CTA B/L no wheezes, no crackles, no rhonchi CARDIOVASCULAR: S1 and S2. Regular rate and rhythm. No murmurs, rubs, or gallops. No edema.  GASTROINTESTINAL: Soft, nontender, nondistended. No masses. Positive bowel sounds.  MUSCULOSKELETAL: No swelling, clubbing, or edema. Range of motion full in all extremities.  NEUROLOGIC: Cranial nerves II through XII are intact. No gross focal neurological deficits.  SKIN: No ulceration, lesions, rashes, or  cyanosis. Skin warm and dry. Turgor intact.  PSYCHIATRIC: Mood, affect within normal limits. The patient is awake, alert and oriented x 3. Insight, judgment intact.      ASSESSMENT / PLAN: 38 year old pleasant white female seen today for assessment for reactive airways disease from ongoing acute bronchitis most likely viral etiology in the setting of moderately persistent asthma With a probable history of pneumonia in the setting of obesity and deconditioned state with reflux disease and chronic allergic rhinitis   Moderately persistent asthma Continue Advair as prescribed We will start Spiriva Respimat therapy Albuterol as needed Avoid triggers   Allergic rhinitis Continue Flonase Continue Singulair Continue antihistamine  Cough Robitussin with codeine as needed   Obesity -recommend significant weight loss -recommend changing diet  Deconditioned state -Recommend increased daily activity and exercise    Patient satisfied with Plan of action and management. All questions answered Follow up in 3 months   Noreta Kue Santiago Glad, M.D.  [facility]  Medical Director [facility] Medical Director [facility] Cardio-Pulmonary Department

## 2018-03-16 ENCOUNTER — Encounter: Payer: BLUE CROSS/BLUE SHIELD | Admitting: Family Medicine

## 2018-03-22 ENCOUNTER — Other Ambulatory Visit: Payer: Self-pay | Admitting: Family Medicine

## 2018-04-03 DIAGNOSIS — M9901 Segmental and somatic dysfunction of cervical region: Secondary | ICD-10-CM | POA: Diagnosis not present

## 2018-04-03 DIAGNOSIS — M530 Cervicocranial syndrome: Secondary | ICD-10-CM | POA: Diagnosis not present

## 2018-04-03 DIAGNOSIS — M9902 Segmental and somatic dysfunction of thoracic region: Secondary | ICD-10-CM | POA: Diagnosis not present

## 2018-04-03 DIAGNOSIS — M531 Cervicobrachial syndrome: Secondary | ICD-10-CM | POA: Diagnosis not present

## 2018-04-06 ENCOUNTER — Telehealth: Payer: Self-pay | Admitting: Family Medicine

## 2018-04-06 ENCOUNTER — Other Ambulatory Visit: Payer: Self-pay | Admitting: Family Medicine

## 2018-04-06 DIAGNOSIS — J4540 Moderate persistent asthma, uncomplicated: Secondary | ICD-10-CM

## 2018-04-06 NOTE — Telephone Encounter (Signed)
Please advise on below  

## 2018-04-06 NOTE — Telephone Encounter (Signed)
Patient called and said her husband has been diagnosed with the flu.  Her husband's doctor said she may want to call her PCP to get a rx for Tamilflu since she has been exposed to the flu.  Patient uses [facility].

## 2018-04-07 ENCOUNTER — Other Ambulatory Visit: Payer: Self-pay | Admitting: Family Medicine

## 2018-04-07 DIAGNOSIS — Z20828 Contact with and (suspected) exposure to other viral communicable diseases: Secondary | ICD-10-CM

## 2018-04-07 MED ORDER — OSELTAMIVIR PHOSPHATE 75 MG PO CAPS: 75.0000 mg | ORAL_CAPSULE | Freq: Every day | ORAL | 0 refills | Status: DC

## 2018-04-07 NOTE — Telephone Encounter (Signed)
I was out of the office yesterday afternoon and just got this message. I called patient and she is not experiencing symptoms of influenza currently. Tamiflu prevention course sent to her pharmacy, she was instructed to call if she has any questions or concerns.

## 2018-04-09 ENCOUNTER — Encounter: Payer: Self-pay | Admitting: Family Medicine

## 2018-04-18 DIAGNOSIS — M530 Cervicocranial syndrome: Secondary | ICD-10-CM | POA: Diagnosis not present

## 2018-04-18 DIAGNOSIS — M9901 Segmental and somatic dysfunction of cervical region: Secondary | ICD-10-CM | POA: Diagnosis not present

## 2018-04-18 DIAGNOSIS — M9902 Segmental and somatic dysfunction of thoracic region: Secondary | ICD-10-CM | POA: Diagnosis not present

## 2018-04-18 DIAGNOSIS — M531 Cervicobrachial syndrome: Secondary | ICD-10-CM | POA: Diagnosis not present

## 2018-05-11 DIAGNOSIS — M9901 Segmental and somatic dysfunction of cervical region: Secondary | ICD-10-CM | POA: Diagnosis not present

## 2018-05-11 DIAGNOSIS — M9902 Segmental and somatic dysfunction of thoracic region: Secondary | ICD-10-CM | POA: Diagnosis not present

## 2018-05-11 DIAGNOSIS — M530 Cervicocranial syndrome: Secondary | ICD-10-CM | POA: Diagnosis not present

## 2018-05-11 DIAGNOSIS — M531 Cervicobrachial syndrome: Secondary | ICD-10-CM | POA: Diagnosis not present

## 2018-05-15 ENCOUNTER — Encounter: Payer: Self-pay | Admitting: Family Medicine

## 2018-05-16 ENCOUNTER — Other Ambulatory Visit: Payer: Self-pay | Admitting: Family Medicine

## 2018-05-16 DIAGNOSIS — B370 Candidal stomatitis: Secondary | ICD-10-CM

## 2018-05-16 MED ORDER — NYSTATIN 100000 UNIT/ML MT SUSP: 5.0000 mL | Freq: Four times a day (QID) | OROMUCOSAL | 0 refills | Status: DC

## 2018-06-12 ENCOUNTER — Encounter: Payer: Self-pay | Admitting: Family Medicine

## 2018-06-12 ENCOUNTER — Other Ambulatory Visit: Payer: Self-pay | Admitting: Family Medicine

## 2018-06-13 DIAGNOSIS — M9902 Segmental and somatic dysfunction of thoracic region: Secondary | ICD-10-CM | POA: Diagnosis not present

## 2018-06-13 DIAGNOSIS — M531 Cervicobrachial syndrome: Secondary | ICD-10-CM | POA: Diagnosis not present

## 2018-06-13 DIAGNOSIS — M9901 Segmental and somatic dysfunction of cervical region: Secondary | ICD-10-CM | POA: Diagnosis not present

## 2018-06-13 DIAGNOSIS — M530 Cervicocranial syndrome: Secondary | ICD-10-CM | POA: Diagnosis not present

## 2018-06-19 ENCOUNTER — Telehealth: Payer: Self-pay

## 2018-06-19 NOTE — Telephone Encounter (Signed)
Called patient for COVID-19 pre-screening for in office visit.  Have you recently traveled any where out of the local area in the last 2 weeks? No  Have you been in close contact with a person diagnosed with COVID-19 within the last 2 weeks? No  Do you currently have any of the following symptoms? If so, when did they start? Cough - chronic (pt has asthma) Diarrhea   Joint Pain Fever      Muscle Pain   Red eyes Shortness of breath   Abdominal pain  Vomiting Loss of smell    Rash    Sore Throat Headache    Weakness   Bruising or bleeding  Okay to proceed with visit.

## 2018-06-21 ENCOUNTER — Encounter: Payer: Self-pay | Admitting: Internal Medicine

## 2018-06-21 ENCOUNTER — Ambulatory Visit (INDEPENDENT_AMBULATORY_CARE_PROVIDER_SITE_OTHER): Payer: BLUE CROSS/BLUE SHIELD | Admitting: Internal Medicine

## 2018-06-21 ENCOUNTER — Other Ambulatory Visit: Payer: Self-pay

## 2018-06-21 VITALS — BP 122/74 | HR 87 | Temp 98.1°F | Ht 67.5 in | Wt 253.2 lb

## 2018-06-21 DIAGNOSIS — J309 Allergic rhinitis, unspecified: Secondary | ICD-10-CM | POA: Diagnosis not present

## 2018-06-21 DIAGNOSIS — J4540 Moderate persistent asthma, uncomplicated: Secondary | ICD-10-CM

## 2018-06-21 MED ORDER — FLUTICASONE-SALMETEROL 100-50 MCG/DOSE IN AEPB: 1.0000 | INHALATION_SPRAY | Freq: Two times a day (BID) | RESPIRATORY_TRACT | 6 refills | Status: DC

## 2018-06-21 NOTE — Progress Notes (Signed)
Name: Sonia Martinez MRN: [id number] DOB: July 06, 1980     CONSULTATION DATE: 2.20.20 REFERRING MD : Sampson Si   STUDIES:    2.6.20  CXR independently reviewed by Me No pneumonia No infiltrates No edema  2.20.20  Spirometry at this time show any evidence of obstructive lung disease Normal ratio normal FEV1  NL chest radiograph     CHIEF COMPLAINT: follow up ASTHMA  HISTORY OF PRESENT ILLNESS: Patient feels much better since starting Spiriva Respimat And has had multiple episodes of thrush since Advair was increased to 50-50  Intermittent wheezing with extreme exercise However symptoms are controlled with triple therapy with inhaled steroids long-acting beta agonist and inhaled anticholinergic  Patient has lost 10 pounds since last office visit She feels much better Her breathing has improved No signs of exacerbation at this time  No signs of infection at this time   Reflux seems to be under control with PPI No respiratory distress    PAST MEDICAL HISTORY :   has a past medical history of Asthma, Fatty liver, Folliculitis (03/31/2016), GERD (gastroesophageal reflux disease), Herpes simplex virus (HSV) infection (03/23/2009), History of methicillin resistant staphylococcus aureus (MRSA) (2013), IBS (irritable bowel syndrome), Migraine without aura, without mention of intractable migraine without mention of status migrainosus (06/05/2013), Obesity, Oligohydramnios, Pre-eclampsia, Preeclampsia (01/05/2016), Pregnancy induced hypertension, and Wrist joint pain (01/09/2017).  has a past surgical history that includes Wisdom tooth extraction; Septoplasty; Cholecystectomy (N/A, 02/24/2017); and Liver biopsy (N/A, 02/24/2017). Prior to Admission medications   Medication Sig Start Date End Date Taking? Authorizing Provider  ADVAIR DISKUS 250-50 MCG/DOSE AEPB INHALE ONE PUFF INTO THE LUNGS TWICE DAILY 01/31/18   Emi Belfast, FNP  albuterol (PROAIR HFA) 108 (90 Base) MCG/ACT  inhaler Inhale 2 puffs into the lungs every 4 (four) hours as needed for wheezing or shortness of breath. 10/20/17   Emi Belfast, FNP  Ascorbic Acid (VITAMIN C) 1000 MG tablet Take 1,000 mg by mouth daily.    [provider]  Bacillus Coagulans-Inulin (PROBIOTIC-PREBIOTIC PO) Take 2 tablets by mouth daily.    [provider]  benzonatate (TESSALON PERLES) 100 MG capsule Take 1 capsule (100 mg total) by mouth 3 (three) times daily as needed. 02/15/18   Doy Mince, PA-C  Cholecalciferol 2000 units CAPS Take by mouth.    [provider]  docusate sodium (COLACE) 100 MG capsule Take 100 mg by mouth daily.    [provider]  doxycycline (VIBRA-TABS) 100 MG tablet Take 1 tablet (100 mg total) by mouth 2 (two) times daily. 03/02/18   Lorre Munroe, NP  fluticasone (FLONASE) 50 MCG/ACT nasal spray USE 2 SPRAYS IN [facility] NOSTRIL ONCE DAILY 11/08/17   Emi Belfast, FNP  Fluticasone-Salmeterol (ADVAIR DISKUS) 250-50 MCG/DOSE AEPB Inhale 1 puff into the lungs 2 (two) times daily. 12/05/17   Emi Belfast, FNP  levocetirizine (XYZAL ALLERGY 24HR) 5 MG tablet Take 1 tablet (5 mg total) by mouth every evening. 10/20/17   Emi Belfast, FNP  loratadine (CLARITIN) 10 MG tablet Take 10 mg by mouth at bedtime.     [provider]  Lysine 500 MG CAPS Take 500 mg by mouth daily.    [provider]  Magnesium 400 MG CAPS Take by mouth.    [provider]  montelukast (SINGULAIR) 10 MG tablet TAKE 1 TABLET BY MOUTH ONCE A DAY 01/15/18   Emi Belfast, FNP  Multiple Vitamin (MULTIVITAMIN) tablet Take 1 tablet by mouth  daily.    [provider]  Omega-3 Fatty Acids (FISH OIL) 1000 MG CAPS Take 1,000 mg by mouth daily.    [provider]  predniSONE (DELTASONE) 10 MG tablet Take 6 tabs day 1, 5 tabs day 2, 4 tabs day 3, 3 tabs day 4, 2 tabs day 5, 1 tab day 6 03/01/18   Lorre Munroe, NP  Pyridoxine HCl (B-6)  100 MG TABS Take 100 mg by mouth daily.    [provider]  valACYclovir (VALTREX) 1000 MG tablet Take    2000mg  ( 2 tablets) every 12 hours for 2 doses. 02/12/18   Joaquim Nam, MD  valACYclovir (VALTREX) 500 MG tablet Take as directed 09/02/14   [provider]   Allergies  Allergen Reactions  . Coconut Oil Hives    ANYTHING WITH COCONUT IN IT  . Dulera [Mometasone Furo-Formoterol Fum]     Patient can only take advair  . Ciprofloxacin Hives and Rash  . Hydrocortisone Hives and Rash     Review of Systems:  Gen:  Denies  fever, sweats, chills weigh loss  HEENT: Denies blurred vision, double vision, ear pain, eye pain, hearing loss, nose bleeds, sore throat Cardiac:  No dizziness, chest pain or heaviness, chest tightness,edema, No JVD Resp:   No cough, -sputum production, -shortness of breath,-wheezing, -hemoptysis,  Gi: Denies swallowing difficulty, stomach pain, nausea or vomiting, diarrhea, constipation, bowel incontinence Gu:  Denies bladder incontinence, burning urine Ext:   Denies Joint pain, stiffness or swelling Skin: Denies  skin rash, easy bruising or bleeding or hives Endoc:  Denies polyuria, polydipsia , polyphagia or weight change Psych:   Denies depression, insomnia or hallucinations  Other:  All other systems negative  ALL OTHER ROS ARE NEGATIVE  BP 122/74 (BP Location: Left Arm, Cuff Size: Normal)   Pulse 87   Temp 98.1 F (36.7 C) (Oral)   Ht 5' 7.5" (1.715 m)   Wt 253 lb 3.2 oz (114.9 kg)   SpO2 96%   BMI 39.07 kg/m   Physical Examination:   GENERAL:NAD, no fevers, chills, no weakness no fatigue HEAD: Normocephalic, atraumatic.  EYES: PERLA, EOMI No scleral icterus.  MOUTH: Moist mucosal membrane.  EAR, NOSE, THROAT: Clear without exudates. No external lesions.  NECK: Supple. No thyromegaly.  No JVD.  PULMONARY: CTA B/L no wheezing, rhonchi, crackles CARDIOVASCULAR: S1 and S2. Regular rate and rhythm. No murmurs GASTROINTESTINAL:  Soft, nontender, nondistended. Positive bowel sounds.  MUSCULOSKELETAL: No swelling, clubbing, or edema.  NEUROLOGIC: No gross focal neurological deficits. 5/5 strength all extremities SKIN: No ulceration, lesions, rashes, or cyanosis.  PSYCHIATRIC: Insight, judgment intact. -depression -anxiety ALL OTHER ROS ARE NEGATIVE         ASSESSMENT / PLAN:  38 year old pleasant white female seen today for follow-up assessment for reactive airways disease in the setting of acute on chronic bronchitis with moderately persistent asthma with underlying obesity and deconditioned state with GERD and chronic allergic rhinitis  Moderately persistent asthma Patient had been on Advair 250/50 and I have started Spiriva Respimat on previous office visit, at this time she is doing much better with her breathing but has had recurrent bouts of thrush and therefore will decrease the dose of Advair to 100/50 Continue Spiriva Respimat as prescribed  Continue to use albuterol as needed Avoid triggers  Allergic rhinitis Continue Flonase as prescribed Continue Singulair Continue antihistamine  Cough Robitussin as needed  Obesity -recommend significant weight loss -recommend changing diet  Deconditioned state -Recommend  increased daily activity and exercise  COVID-19 EDUCATION: The signs and symptoms of COVID-19 were discussed with the patient and how to seek care for testing.  The importance of social distancing was discussed today. Hand Washing Techniques and avoid touching face was advised.  MEDICATION ADJUSTMENTS/LABS AND TESTS ORDERED: Advair has changed to 150 as previously prescribed  CURRENT MEDICATIONS REVIEWED AT LENGTH WITH PATIENT TODAY   Patient/Family are satisfied with Plan of action and management. All questions answered Follow up in 6 months   Breana Litts Santiago Gladavid Elcie Pelster, M.D.  [facility]  Medical Director [facility] Medical Director [facility]  Cardio-Pulmonary Department

## 2018-06-21 NOTE — Patient Instructions (Addendum)
Continue Spiriva Respimat As prescribed  Change back to Adviar 100/50  Rinse mouth out after every use  Avoid Triggers  Continue Protonix  CONGRATULATIONS ON LOSING 10 pounds!

## 2018-07-10 DIAGNOSIS — Z03818 Encounter for observation for suspected exposure to other biological agents ruled out: Secondary | ICD-10-CM | POA: Diagnosis not present

## 2018-07-11 DIAGNOSIS — M530 Cervicocranial syndrome: Secondary | ICD-10-CM | POA: Diagnosis not present

## 2018-07-11 DIAGNOSIS — M9902 Segmental and somatic dysfunction of thoracic region: Secondary | ICD-10-CM | POA: Diagnosis not present

## 2018-07-11 DIAGNOSIS — M9901 Segmental and somatic dysfunction of cervical region: Secondary | ICD-10-CM | POA: Diagnosis not present

## 2018-07-11 DIAGNOSIS — M531 Cervicobrachial syndrome: Secondary | ICD-10-CM | POA: Diagnosis not present

## 2018-07-24 ENCOUNTER — Telehealth: Payer: BLUE CROSS/BLUE SHIELD | Admitting: Physician Assistant

## 2018-07-24 DIAGNOSIS — B370 Candidal stomatitis: Secondary | ICD-10-CM

## 2018-07-24 MED ORDER — NYSTATIN 100000 UNIT/ML MT SUSP: 5.0000 mL | Freq: Four times a day (QID) | OROMUCOSAL | 0 refills | Status: DC

## 2018-07-24 NOTE — Progress Notes (Unsigned)
We are sorry that you are not feeling well.  Here is how we plan to help! I will provide nystatin swish and swallow, as the picture you have submitted does appear to be thrush.   Your symptoms indicate a likely viral infection (Pharyngitis).   Pharyngitis is inflammation in the back of the throat which can cause a sore throat, scratchiness and sometimes difficulty swallowing.   Pharyngitis is typically caused by a respiratory virus and will just run its course.  Remember to wash your hands thoroughly throughout the day as this is the number one way to prevent the spread of infection and wipe down door knobs and counters with disinfectant.  After careful review of your answers, I would not recommend and antibiotic for your condition.  Antibiotics should not be used to treat conditions that we suspect are caused by viruses like the virus that causes the common cold or flu. However, some people can have Strep with atypical symptoms. You may need formal testing in clinic or office to confirm if your symptoms continue or worsen.  Providers prescribe antibiotics to treat infections caused by bacteria. Antibiotics are very powerful in treating bacterial infections when they are used properly.  To maintain their effectiveness, they should be used only when necessary.  Overuse of antibiotics has resulted in the development of super bugs that are resistant to treatment!    Home Care: Only take medications as instructed by your medical team. Do not drink alcohol while taking these medications. A steam or ultrasonic humidifier can help congestion.  You can place a towel over your head and breathe in the steam from hot water coming from a faucet. Avoid close contacts especially the very young and the elderly. Cover your mouth when you cough or sneeze. Always remember to wash your hands.  Get Help Right Away If: You develop worsening fever or throat pain. You develop a severe head ache or visual changes. Your  symptoms persist after you have completed your treatment plan.  Make sure you Understand these instructions. Will watch your condition. Will get help right away if you are not doing well or get worse.  Your e-visit answers were reviewed by a board certified advanced clinical practitioner to complete your personal care plan.  Depending on the condition, your plan could have included both over the counter or prescription medications.  If there is a problem please reply once you have received a response from your provider.  Your safety is important to [place].  If you have drug allergies check your prescription carefully.    You can use MyChart to ask questions about todays visit, request a non-urgent call back, or ask for a work or school excuse for 24 hours related to this e-Visit. If it has been greater than 24 hours you will need to follow up with your provider, or enter a new e-Visit to address those concerns.  You will get an e-mail in the next two days asking about your experience.  I hope that your e-visit has been valuable and will speed your recovery. Thank you for using e-visits.    ===View-only below this line===   ----- Message -----    From: Sonia Martinez    Sent: 07/24/2018  3:40 PM EDT      To: E-Visit Mailing List Subject: Sore Throat  Sore Throat --------------------------------  Question: Do you have any of the following symptoms (please select all that apply)? Answer:   Change in the quality of your voice  White patches or pus on side or back of the throat  Question: How long have you been having these symptoms? Answer:   4 days  Question: Do you have a fever? Answer:   No, I do not have a fever  Question: Are you in close contact with anyone who has similar symptoms ? Answer:   No  Question: Are you taking any over the counter medications for your symptoms? Answer:   No  Question: Please list your medication allergies that you may have ? (If  'none' , please list as 'none') Answer:   Hydrocortisone and Cypro  Question: Please list any additional comments  Answer:   I have recurrent oral thrush and it is on the back of my throat/uvula and on my tongue.  Question: Are you pregnant? Answer:   I am confident that I am not pregnant  Question: Are you breastfeeding? Answer:   No  A total of 5-10 minutes was spent evaluating this patients questionnaire and formulating a plan of care.

## 2018-07-31 DIAGNOSIS — B379 Candidiasis, unspecified: Secondary | ICD-10-CM | POA: Diagnosis not present

## 2018-08-07 ENCOUNTER — Telehealth: Payer: Self-pay

## 2018-08-07 ENCOUNTER — Telehealth: Payer: Self-pay | Admitting: Family Medicine

## 2018-08-07 DIAGNOSIS — Z20822 Contact with and (suspected) exposure to covid-19: Secondary | ICD-10-CM

## 2018-08-07 NOTE — Telephone Encounter (Signed)
Please call patient and let her know that  I have sent a message to the [facility] to request testing. She should get a call in the next couple of days regarding testing. She should quarantine at home while awaiting test results.

## 2018-08-07 NOTE — Telephone Encounter (Signed)
Pt called to request covid testing. She is not having any symptoms at this time. Pt's parents-in-law were watching her children for a few days. They met on 6/29 with minimal exposure and exchanged the children and again on 7/5. Her mother-in-law was covid  tested on 7/8 and results were positive. Her children are being test=ted thru pediatrician.

## 2018-08-07 NOTE — Telephone Encounter (Signed)
Attempted to call pt. To schedule appt. For COVID testing.  Left vm. To return call to [phone] to schedule appt.  Order placed.

## 2018-08-07 NOTE — Telephone Encounter (Signed)
Pt given appt for tomorrow at [facility] at 0900. Advised pt to wear masks to testing site and to stay in car. Pt verbalized understanding.

## 2018-08-07 NOTE — Telephone Encounter (Signed)
-----   Message from Elby Beck, [place] sent at 08/07/2018  3:46 PM EDT ----- Good afternoon,  I am requesting COVID testing for this patient who has had a direct contact with a positive covid person.  Thank you,  Tor Netters, FNP-BC

## 2018-08-08 ENCOUNTER — Other Ambulatory Visit: Payer: BLUE CROSS/BLUE SHIELD

## 2018-08-08 DIAGNOSIS — Z20822 Contact with and (suspected) exposure to covid-19: Secondary | ICD-10-CM

## 2018-08-08 DIAGNOSIS — R6889 Other general symptoms and signs: Secondary | ICD-10-CM | POA: Diagnosis not present

## 2018-08-08 NOTE — Telephone Encounter (Signed)
Patient is scheduled to have COVID test today.

## 2018-08-09 ENCOUNTER — Telehealth: Payer: BC Managed Care – PPO | Admitting: Family

## 2018-08-09 DIAGNOSIS — J4540 Moderate persistent asthma, uncomplicated: Secondary | ICD-10-CM | POA: Diagnosis not present

## 2018-08-09 DIAGNOSIS — R6889 Other general symptoms and signs: Secondary | ICD-10-CM

## 2018-08-09 DIAGNOSIS — Z20822 Contact with and (suspected) exposure to covid-19: Secondary | ICD-10-CM

## 2018-08-09 MED ORDER — ALBUTEROL SULFATE HFA 108 (90 BASE) MCG/ACT IN AERS: 2.0000 | INHALATION_SPRAY | RESPIRATORY_TRACT | 2 refills | Status: DC | PRN

## 2018-08-09 MED ORDER — BENZONATATE 100 MG PO CAPS: 100.0000 mg | ORAL_CAPSULE | Freq: Three times a day (TID) | ORAL | 0 refills | Status: DC | PRN

## 2018-08-09 NOTE — Progress Notes (Signed)
Greater than 5 minutes, yet less than 10 minutes of time have been spent researching, coordinating, and implementing care for this patient today.  Thank you for the details you included in the comment boxes. Those details are very helpful in determining the best course of treatment for you and help us to provide the best care.  Providers prescribe antibiotics to treat infections caused by bacteria. Antibiotics are very powerful in treating bacterial infections when they are used properly. To maintain their effectiveness, they should be used only when necessary. Overuse of antibiotics has resulted in the development of superbugs that are resistant to treatment!    After careful review of your answers, I would not recommend an antibiotic for your condition.  Antibiotics are not effective against viruses and therefore should not be used to treat them. Common examples of infections caused by viruses include colds and flu   [facility]   Your current symptoms could be consistent with the coronavirus.  Many health care providers can now test patients at their office but not all are.  [place] has multiple testing sites. For information on our COVID testing locations and hours go to [email or web address]  Please quarantine yourself while awaiting your test results.    COVID-19 is a respiratory illness with symptoms that are similar to the flu. Symptoms are typically mild to moderate, but there have been cases of severe illness and death due to the virus. The following symptoms may appear 2-14 days after exposure: . Fever . Cough . Shortness of breath or difficulty breathing . Chills . Repeated shaking with chills . Muscle pain . Headache . Sore throat . New loss of taste or smell . Fatigue . Congestion or runny nose . Nausea or vomiting . Diarrhea  It is vitally important that if you feel that you have an infection such as this virus or any  other virus that you stay home and away from places where you may spread it to others.  You should self-quarantine for 14 days if you have symptoms that could potentially be coronavirus or have been in close contact a with a person diagnosed with COVID-19 within the last 2 weeks. You should avoid contact with people age 36 and older.   You should wear a mask or cloth face covering over your nose and mouth if you must be around other people or animals, including pets (even at home). Try to stay at least 6 feet away from other people. This will protect the people around you.  You can use medication such as A prescription cough medication called Tessalon Perles 100 mg. You may take 1-2 capsules every 8 hours as needed for cough and A prescription inhaler called Albuterol MDI 90 mcg /actuation 2 puffs every 4 hours as needed for shortness of breath, wheezing, cough  You may also take acetaminophen (Tylenol) as needed for fever.   Reduce your risk of any infection by using the same precautions used for avoiding the common cold or flu:  Marland Kitchen. Wash your hands often with soap and warm water for at least 20 seconds.  If soap and water are not readily available, use an alcohol-based hand sanitizer with at least 60% alcohol.  . If coughing or sneezing, cover your mouth and nose by coughing or sneezing into the elbow areas of your shirt or coat, into a tissue or into your sleeve (not your hands). . Avoid shaking hands with others and consider head nods or verbal greetings only. .Marland Kitchen  Avoid touching your eyes, nose, or mouth with unwashed hands.  . Avoid close contact with people who are sick. . Avoid places or events with large numbers of people in one location, like concerts or sporting events. . Carefully consider travel plans you have or are making. . If you are planning any travel outside or inside the [place], visit the CDC's Travelers' Health webpage for the latest health notices. . If you have some symptoms but not  all symptoms, continue to monitor at home and seek medical attention if your symptoms worsen. . If you are having a medical emergency, call 911.  HOME CARE . Only take medications as instructed by your medical team. . Drink plenty of fluids and get plenty of rest. . A steam or ultrasonic humidifier can help if you have congestion.   GET HELP RIGHT AWAY IF YOU HAVE EMERGENCY WARNING SIGNS** FOR COVID-19. If you or someone is showing any of these signs seek emergency medical care immediately. Call 911 or proceed to your closest emergency facility if: . You develop worsening high fever. . Trouble breathing . Bluish lips or face . Persistent pain or pressure in the chest . New confusion . Inability to wake or stay awake . You cough up blood. . Your symptoms become more severe  **This list is not all possible symptoms. Contact your medical provider for any symptoms that are sever or concerning to you.   MAKE SURE YOU   Understand these instructions.  Will watch your condition.  Will get help right away if you are not doing well or get worse.  Your e-visit answers were reviewed by a board certified advanced clinical practitioner to complete your personal care plan.  Depending on the condition, your plan could have included both over the counter or prescription medications.  If there is a problem please reply once you have received a response from your provider.  Your safety is important to [place].  If you have drug allergies check your prescription carefully.    You can use MyChart to ask questions about today's visit, request a non-urgent call back, or ask for a work or school excuse for 24 hours related to this e-Visit. If it has been greater than 24 hours you will need to follow up with your provider, or enter a new e-Visit to address those concerns. You will get an e-mail in the next two days asking about your experience.  I hope that your e-visit has been valuable and will speed your  recovery. Thank you for using e-visits.

## 2018-08-11 ENCOUNTER — Telehealth: Payer: Self-pay

## 2018-08-11 NOTE — Telephone Encounter (Signed)
Pt called for results- results not back yet.

## 2018-08-12 LAB — NOVEL CORONAVIRUS, NAA: SARS-CoV-2, NAA: NOT DETECTED

## 2018-08-13 ENCOUNTER — Other Ambulatory Visit: Payer: Self-pay | Admitting: Family Medicine

## 2018-08-13 DIAGNOSIS — J4540 Moderate persistent asthma, uncomplicated: Secondary | ICD-10-CM

## 2018-08-21 ENCOUNTER — Other Ambulatory Visit: Payer: Self-pay

## 2018-08-21 DIAGNOSIS — R490 Dysphonia: Secondary | ICD-10-CM

## 2018-08-21 MED ORDER — OMEPRAZOLE 20 MG PO CPDR: 20.0000 mg | DELAYED_RELEASE_CAPSULE | Freq: Every day | ORAL | 0 refills | Status: DC

## 2018-08-21 NOTE — Telephone Encounter (Signed)
Has she tried a lower dose of the omeprazole like 20 mg? It may be reasonable to try her on a lower dose to see if that is enough for her symptoms.  Has she had any breakthrough hoarseness, esophageal reflux/heartburn while taking omeprazole 40 mg?

## 2018-08-21 NOTE — Telephone Encounter (Signed)
Patient has not tried 20 mg dose. Patient states she never had heartburn/reflux issues she was just put on this medication because Dr. Richardson Landry thought hoarseness was from indigestion issue. Patient states hoarsness did improve significantly on this medication. She does have hoarse feeling in the morning at times but it improves after drinking some water and clearing her throat. She is willing to try 20 mg dose if need be.

## 2018-08-21 NOTE — Telephone Encounter (Signed)
Noted.  Will send omeprazole 20 mg to pharmacy for her to try. Please have her follow-up with PCP for further refills of this medication and follow-up of hoarseness. Will CC PCP.

## 2018-08-21 NOTE — Telephone Encounter (Signed)
Spoke with patient about refill request we received from pharmacy for Omeprazole 40 mg. This was originally prescribed by Dr. Richardson Landry  July 2019 with refills when she went to see him for hoarseness. She has been taking it this whole time (this was not on her list of medication with [place]). She has not seen Dr. Richardson Landry in 1 year and they will not fill without appointment. Patient is not able to come in and be checked due to she is under quarantine because her daughter has [place]. Patient wanted to know if we could at least send in for 30 days if we can not fill for longer.

## 2018-08-22 NOTE — Telephone Encounter (Signed)
Patient advised.

## 2018-09-10 ENCOUNTER — Encounter: Payer: Self-pay | Admitting: Family Medicine

## 2018-09-10 ENCOUNTER — Other Ambulatory Visit: Payer: Self-pay

## 2018-09-10 ENCOUNTER — Ambulatory Visit (INDEPENDENT_AMBULATORY_CARE_PROVIDER_SITE_OTHER): Payer: BC Managed Care – PPO | Admitting: Family Medicine

## 2018-09-10 VITALS — BP 110/68 | HR 74 | Temp 98.4°F | Ht 68.0 in | Wt 244.4 lb

## 2018-09-10 DIAGNOSIS — E669 Obesity, unspecified: Secondary | ICD-10-CM | POA: Diagnosis not present

## 2018-09-10 DIAGNOSIS — Z8619 Personal history of other infectious and parasitic diseases: Secondary | ICD-10-CM | POA: Diagnosis not present

## 2018-09-10 DIAGNOSIS — Z0000 Encounter for general adult medical examination without abnormal findings: Secondary | ICD-10-CM

## 2018-09-10 DIAGNOSIS — R5383 Other fatigue: Secondary | ICD-10-CM

## 2018-09-10 LAB — COMPREHENSIVE METABOLIC PANEL
ALT: 39 U/L — ABNORMAL HIGH (ref 0–35)
AST: 28 U/L (ref 0–37)
Albumin: 4 g/dL (ref 3.5–5.2)
Alkaline Phosphatase: 78 U/L (ref 39–117)
BUN: 9 mg/dL (ref 6–23)
CO2: 26 mEq/L (ref 19–32)
Calcium: 9.1 mg/dL (ref 8.4–10.5)
Chloride: 107 mEq/L (ref 96–112)
Creatinine, Ser: 0.83 mg/dL (ref 0.40–1.20)
GFR: 77.88 mL/min (ref >60.00)
Glucose, Bld: 95 mg/dL (ref 70–99)
Potassium: 4.2 mEq/L (ref 3.5–5.1)
Sodium: 140 mEq/L (ref 135–145)
Total Bilirubin: 0.7 mg/dL (ref 0.2–1.2)
Total Protein: 7 g/dL (ref 6.0–8.3)

## 2018-09-10 LAB — CBC WITH DIFFERENTIAL/PLATELET
Basophils Absolute: 0 10*3/uL (ref 0.0–0.1)
Basophils Relative: 0.4 % (ref 0.0–3.0)
Eosinophils Absolute: 0.2 10*3/uL (ref 0.0–0.7)
Eosinophils Relative: 2.4 % (ref 0.0–5.0)
HCT: 40.1 % (ref 36.0–46.0)
Hemoglobin: 13.6 g/dL (ref 12.0–15.0)
Lymphocytes Relative: 25.9 % (ref 12.0–46.0)
Lymphs Abs: 1.7 10*3/uL (ref 0.7–4.0)
MCHC: 33.9 g/dL (ref 30.0–36.0)
MCV: 92.5 fl (ref 78.0–100.0)
Monocytes Absolute: 0.3 10*3/uL (ref 0.1–1.0)
Monocytes Relative: 4.9 % (ref 3.0–12.0)
Neutro Abs: 4.3 10*3/uL (ref 1.4–7.7)
Neutrophils Relative %: 66.4 % (ref 43.0–77.0)
Platelets: 214 10*3/uL (ref 150.0–400.0)
RBC: 4.34 Mil/uL (ref 3.87–5.11)
RDW: 13.1 % (ref 11.5–15.5)
WBC: 6.4 10*3/uL (ref 4.0–10.5)

## 2018-09-10 LAB — VITAMIN D 25 HYDROXY (VIT D DEFICIENCY, FRACTURES): VITD: 65.55 ng/mL (ref 30.00–100.00)

## 2018-09-10 LAB — VITAMIN B12: Vitamin B-12: 719 pg/mL (ref 211–911)

## 2018-09-10 LAB — HEMOGLOBIN A1C: Hgb A1c MFr Bld: 5.4 % (ref 4.6–6.5)

## 2018-09-10 NOTE — Patient Instructions (Signed)
Good to see you today  Consider- are you getting enough restorative movement? Yoga, pleasure walking.  Are you doing too much cardio?   Look at Get to the point with Jennet Maduro podcast, Fat loss lifestyle    Health Maintenance, Female Adopting a healthy lifestyle and getting preventive care are important in promoting health and wellness. Ask your health care provider about:  The right schedule for you to have regular tests and exams.  Things you can do on your own to prevent diseases and keep yourself healthy. What should I know about diet, weight, and exercise? Eat a healthy diet   Eat a diet that includes plenty of vegetables, fruits, low-fat dairy products, and lean protein.  Do not eat a lot of foods that are high in solid fats, added sugars, or sodium. Maintain a healthy weight Body mass index (BMI) is used to identify weight problems. It estimates body fat based on height and weight. Your health care provider can help determine your BMI and help you achieve or maintain a healthy weight. Get regular exercise Get regular exercise. This is one of the most important things you can do for your health. Most adults should:  Exercise for at least 150 minutes each week. The exercise should increase your heart rate and make you sweat (moderate-intensity exercise).  Do strengthening exercises at least twice a week. This is in addition to the moderate-intensity exercise.  Spend less time sitting. Even light physical activity can be beneficial. Watch cholesterol and blood lipids Have your blood tested for lipids and cholesterol at 36 years of age, then have this test every 5 years. Have your cholesterol levels checked more often if:  Your lipid or cholesterol levels are high.  You are older than 36 years of age.  You are at high risk for heart disease. What should I know about cancer screening? Depending on your health history and family history, you may need to have cancer  screening at various ages. This may include screening for:  Breast cancer.  Cervical cancer.  Colorectal cancer.  Skin cancer.  Lung cancer. What should I know about heart disease, diabetes, and high blood pressure? Blood pressure and heart disease  High blood pressure causes heart disease and increases the risk of stroke. This is more likely to develop in people who have high blood pressure readings, are of African descent, or are overweight.  Have your blood pressure checked: ? Every 3-5 years if you are 18-16 years of age. ? Every year if you are 22 years old or older. Diabetes Have regular diabetes screenings. This checks your fasting blood sugar level. Have the screening done:  Once every three years after age 15 if you are at a normal weight and have a low risk for diabetes.  More often and at a younger age if you are overweight or have a high risk for diabetes. What should I know about preventing infection? Hepatitis B If you have a higher risk for hepatitis B, you should be screened for this virus. Talk with your health care provider to find out if you are at risk for hepatitis B infection. Hepatitis C Testing is recommended for:  Everyone born from [birth year] through 1963.  Anyone with known risk factors for hepatitis C. Sexually transmitted infections (STIs)  Get screened for STIs, including gonorrhea and chlamydia, if: ? You are sexually active and are younger than 36 years of age. ? You are older than 36 years of age and your health  care provider tells you that you are at risk for this type of infection. ? Your sexual activity has changed since you were last screened, and you are at increased risk for chlamydia or gonorrhea. Ask your health care provider if you are at risk.  Ask your health care provider about whether you are at high risk for HIV. Your health care provider may recommend a prescription medicine to help prevent HIV infection. If you choose to take  medicine to prevent HIV, you should first get tested for HIV. You should then be tested every 3 months for as long as you are taking the medicine. Pregnancy  If you are about to stop having your period (premenopausal) and you may become pregnant, seek counseling before you get pregnant.  Take 400 to 800 micrograms (mcg) of folic acid every day if you become pregnant.  Ask for birth control (contraception) if you want to prevent pregnancy. Osteoporosis and menopause Osteoporosis is a disease in which the bones lose minerals and strength with aging. This can result in bone fractures. If you are 23 years old or older, or if you are at risk for osteoporosis and fractures, ask your health care provider if you should:  Be screened for bone loss.  Take a calcium or vitamin D supplement to lower your risk of fractures.  Be given hormone replacement therapy (HRT) to treat symptoms of menopause. Follow these instructions at home: Lifestyle  Do not use any products that contain nicotine or tobacco, such as cigarettes, e-cigarettes, and chewing tobacco. If you need help quitting, ask your health care provider.  Do not use street drugs.  Do not share needles.  Ask your health care provider for help if you need support or information about quitting drugs. Alcohol use  Do not drink alcohol if: ? Your health care provider tells you not to drink. ? You are pregnant, may be pregnant, or are planning to become pregnant.  If you drink alcohol: ? Limit how much you use to 0-1 drink a day. ? Limit intake if you are breastfeeding.  Be aware of how much alcohol is in your drink. In the [place] one drink equals one 12 oz bottle of beer (355 mL), one 5 oz glass of wine (148 mL), or one 1 oz glass of hard liquor (44 mL). General instructions  Schedule regular health, dental, and eye exams.  Stay current with your vaccines.  Tell your health care provider if: ? You often feel depressed. ? You have  ever been abused or do not feel safe at home. Summary  Adopting a healthy lifestyle and getting preventive care are important in promoting health and wellness.  Follow your health care provider's instructions about healthy diet, exercising, and getting tested or screened for diseases.  Follow your health care provider's instructions on monitoring your cholesterol and blood pressure. This information is not intended to replace advice given to you by your health care provider. Make sure you discuss any questions you have with your health care provider. Document Released: 07/26/2010 Document Revised: 01/03/2018 Document Reviewed: 01/03/2018 Elsevier Patient Education  2020 [employer].

## 2018-09-10 NOTE — Progress Notes (Signed)
Subjective:    Patient ID: Sonia Martinez, female    DOB: Feb 11, 1980, 38 y.o.   MRN: [id number]  HPI This is a 38 yo female who presents today for CPE.    Last CPE- last year Pap- Up to date Mammo- has screening scheduled (early due to family history) Tdap- deferrred Flu- annual Eye- regular Dental- regular Exercise- [employer] several times a week, combination cardio, strength, stretching Sleep- doesn't feel rested when she awakes, Apple watch- good sleep. No sleep apnea- evaluated by ENT and pulmonologist. For a long time, about 8 months. Prior, energy level was better.   Thrush- saw ENT, has gone down on Advair and had course of diflucan.   Asthma- better.   Past Medical History:  Diagnosis Date  . Asthma    WELL CONTROLLED  . Fatty liver   . Folliculitis 03/31/2016  . GERD (gastroesophageal reflux disease)   . Herpes simplex virus (HSV) infection 03/23/2009   Overview:  Herpes Simplex  10/1 IMO update  . History of methicillin resistant staphylococcus aureus (MRSA) 2013   + PCR SCREEN  . IBS (irritable bowel syndrome)   . Migraine without aura, without mention of intractable migraine without mention of status migrainosus 06/05/2013  . Obesity   . Oligohydramnios    2013  . Pre-eclampsia    2013  . Preeclampsia 01/05/2016  . Pregnancy induced hypertension   . Wrist joint pain 01/09/2017   Past Surgical History:  Procedure Laterality Date  . CHOLECYSTECTOMY N/A 02/24/2017   Procedure: LAPAROSCOPIC CHOLECYSTECTOMY;  Surgeon: Leafy RoPabon, Diego F, MD;  Location: [facility] ORS;  Service: General;  Laterality: N/A;  . LIVER BIOPSY N/A 02/24/2017   Procedure: LIVER BIOPSY;  Surgeon: Leafy RoPabon, Diego F, MD;  Location: [facility] ORS;  Service: General;  Laterality: N/A;  . SEPTOPLASTY    . WISDOM TOOTH EXTRACTION     Family History  Problem Relation Age of Onset  . Hypertension Mother   . Cancer - Other Mother        uterine  . Arthritis Mother   . Heart disease Maternal  Grandfather   . Hyperlipidemia Maternal Grandfather   . Hypertension Maternal Grandfather   . Diabetes Paternal Grandmother   . Hyperlipidemia Paternal Grandmother   . Stroke Paternal Grandmother   . Hypertension Paternal Grandmother   . Lung cancer Paternal Grandfather   . Diabetes Paternal Grandfather   . Cancer - Other Paternal Grandfather        lung  . Hyperlipidemia Paternal Grandfather   . Hypertension Paternal Grandfather   . Hypertension Father   . Arthritis Maternal Grandmother   . Hyperlipidemia Maternal Grandmother   . Stroke Maternal Grandmother   . Hypertension Maternal Grandmother   . Anesthesia problems Neg Hx   . Malignant hyperthermia Neg Hx   . Pseudochol deficiency Neg Hx   . Hypotension Neg Hx   . Migraines Neg Hx    Social History   Tobacco Use  . Smoking status: Never Smoker  . Smokeless tobacco: Never Used  Substance Use Topics  . Alcohol use: No  . Drug use: No      Review of Systems  Constitutional: Positive for fatigue (see HPI).  HENT: Positive for postnasal drip (occasional).   Eyes:       Eyes dry on Xyzal.   Respiratory: Positive for chest tightness (rare, very infrequent rescue inhaler use). Negative for shortness of breath and wheezing.   Cardiovascular: Negative.   Gastrointestinal: Negative.  Endocrine: Negative.   Genitourinary: Negative.   Musculoskeletal: Negative.   Skin: Negative.   Allergic/Immunologic: Positive for environmental allergies.  Neurological: Negative.   Hematological: Negative.        Objective:   Physical Exam Physical Exam  Constitutional: She is oriented to person, place, and time. She appears well-developed and well-nourished. No distress.  HENT:  Head: Normocephalic and atraumatic.  Right Ear: External ear normal.  Left Ear: External ear normal.  Nose: Nose normal.  Eyes: Conjunctivae are normal.  Neck: Normal range of motion. Neck supple. No JVD present. No thyromegaly present.   Cardiovascular: Normal rate, regular rhythm, normal heart sounds and intact distal pulses.   Pulmonary/Chest: Effort normal and breath sounds normal. Right breast exhibits no inverted nipple, no mass, no nipple discharge, no skin change and no tenderness. Left breast exhibits no inverted nipple, no mass, no nipple discharge, no skin change and no tenderness. Breasts are symmetrical.  Abdominal: Soft. Bowel sounds are normal. She exhibits no distension and no mass. There is no tenderness. There is no rebound and no guarding.  Musculoskeletal: Normal range of motion. She exhibits no edema or tenderness.  Lymphadenopathy:    She has no cervical adenopathy.  Neurological: She is alert and oriented to person, place, and time. Skin: Skin is warm and dry. She is not diaphoretic.  Psychiatric: She has a normal mood and affect. Her behavior is normal. Judgment and thought content normal.  Vitals reviewed.    BP 110/68 (BP Location: Left Arm, Patient Position: Sitting, Cuff Size: Normal)   Pulse 74   Temp 98.4 F (36.9 C) (Temporal)   Ht 5\' 8"  (1.727 m)   Wt 244 lb 6.4 oz (110.9 kg)   SpO2 98%   BMI 37.16 kg/m  Wt Readings from Last 3 Encounters:  09/10/18 244 lb 6.4 oz (110.9 kg)  06/21/18 253 lb 3.2 oz (114.9 kg)  03/15/18 261 lb (118.4 kg)    Results of the Epworth flowsheet 09/10/2018  Sitting and reading 1  Watching TV 0  Sitting, inactive in a public place (e.g. a theatre or a meeting) 0  As a passenger in a car for an hour without a break 1  Lying down to rest in the afternoon when circumstances permit 2  Sitting and talking to someone 0  Sitting quietly after a lunch without alcohol 0  In a car, while stopped for a few minutes in traffic 0  Total score 4   Depression screen [facility] 2/9 09/10/2018 01/09/2017  Decreased Interest 0 0  Down, Depressed, Hopeless 0 0  PHQ - 2 Score 0 0       Assessment & Plan:  .1. Annual physical exam - Discussed and encouraged healthy lifestyle  choices- adequate sleep, regular exercise, stress management and healthy food choices.    2. Other fatigue - negative Epworth, she is making healthy food choices and exercising regularly - will check labs - CBC with Differential - Comprehensive metabolic panel - Vitamin Y40 - Vitamin D, 25-hydroxy  3. Frequent infections - IgG, IgA, IgM - HIV Antibody (routine testing w rflx)  4. Obesity (BMI 35.0-39.9 without comorbidity) - has lost 17 pounds in last 6 months with diet and exercise - Hemoglobin A1c - Lipid panel; Future   Clarene Reamer, FNP-BC  [facility]  09/10/2018 9:59 AM

## 2018-09-11 LAB — IGG, IGA, IGM
IgG (Immunoglobin G), Serum: 999 mg/dL (ref 600–1640)
IgM, Serum: 109 mg/dL (ref 50–300)
Immunoglobulin A: 120 mg/dL (ref 47–310)

## 2018-09-11 LAB — HIV ANTIBODY (ROUTINE TESTING W REFLEX): HIV 1&2 Ab, 4th Generation: NONREACTIVE

## 2018-09-12 DIAGNOSIS — M530 Cervicocranial syndrome: Secondary | ICD-10-CM | POA: Diagnosis not present

## 2018-09-12 DIAGNOSIS — M531 Cervicobrachial syndrome: Secondary | ICD-10-CM | POA: Diagnosis not present

## 2018-09-12 DIAGNOSIS — M9902 Segmental and somatic dysfunction of thoracic region: Secondary | ICD-10-CM | POA: Diagnosis not present

## 2018-09-12 DIAGNOSIS — M9901 Segmental and somatic dysfunction of cervical region: Secondary | ICD-10-CM | POA: Diagnosis not present

## 2018-09-13 ENCOUNTER — Encounter: Payer: Self-pay | Admitting: Family Medicine

## 2018-09-17 ENCOUNTER — Other Ambulatory Visit: Payer: Self-pay | Admitting: Primary Care

## 2018-09-17 ENCOUNTER — Other Ambulatory Visit: Payer: Self-pay

## 2018-09-17 DIAGNOSIS — R490 Dysphonia: Secondary | ICD-10-CM

## 2018-09-18 ENCOUNTER — Other Ambulatory Visit: Payer: Self-pay | Admitting: *Deleted

## 2018-09-18 DIAGNOSIS — R5383 Other fatigue: Secondary | ICD-10-CM

## 2018-09-18 NOTE — Telephone Encounter (Signed)
Sonia Martinez can you please look into this and see if the TSH was ever added to her labs drawn on 09/10/2018?  I do not see any results in Epic.

## 2018-09-18 NOTE — Progress Notes (Signed)
See mychart encounter 09/13/2018

## 2018-09-18 NOTE — Telephone Encounter (Signed)
Blood was too old to be used to add on TSH Pt notified and asked if this lab is still wanted.  Pt advised that she can set up a lab visit to have this lab checked if wanted.   Will send to [facility] as Juluis Rainier

## 2018-09-19 ENCOUNTER — Telehealth: Payer: Self-pay

## 2018-09-19 DIAGNOSIS — R5383 Other fatigue: Secondary | ICD-10-CM | POA: Diagnosis not present

## 2018-09-19 NOTE — Telephone Encounter (Signed)
[place] from [facility] calling that pt is there to have labs but [facility] cannot see orders. Terri in lab will release the order so [place] can see it. [place] voiced understanding.

## 2018-09-20 LAB — TSH: TSH: 0.857 u[IU]/mL (ref 0.450–4.500)

## 2018-10-10 DIAGNOSIS — M531 Cervicobrachial syndrome: Secondary | ICD-10-CM | POA: Diagnosis not present

## 2018-10-10 DIAGNOSIS — M530 Cervicocranial syndrome: Secondary | ICD-10-CM | POA: Diagnosis not present

## 2018-10-10 DIAGNOSIS — M9902 Segmental and somatic dysfunction of thoracic region: Secondary | ICD-10-CM | POA: Diagnosis not present

## 2018-10-10 DIAGNOSIS — M9901 Segmental and somatic dysfunction of cervical region: Secondary | ICD-10-CM | POA: Diagnosis not present

## 2018-11-01 ENCOUNTER — Telehealth: Payer: BC Managed Care – PPO | Admitting: Physician Assistant

## 2018-11-01 DIAGNOSIS — B370 Candidal stomatitis: Secondary | ICD-10-CM

## 2018-11-01 MED ORDER — CLOTRIMAZOLE 10 MG MT TROC: 10.0000 mg | Freq: Every day | OROMUCOSAL | 0 refills | Status: AC

## 2018-11-01 NOTE — Progress Notes (Signed)
We are sorry that you are not feeling well.  Here is how we plan to help! Sounds like you have thrush. I see this is not your first diagnosis recently.  If this continues to happen, I recommend following up with your PCP, Tor Netters.  I will provide Clotrimazole troche 10mg  dissolved in mouth 5x/day for 7 days.    Your symptoms indicate a likely viral infection (Pharyngitis).   Pharyngitis is inflammation in the back of the throat which can cause a sore throat, scratchiness and sometimes difficulty swallowing.   Pharyngitis is typically caused by a respiratory virus and will just run its course.  Please keep in mind that your symptoms could last up to 10 days.  For throat pain, we recommend over the counter oral pain relief medications such as acetaminophen or aspirin, or anti-inflammatory medications such as ibuprofen or naproxen sodium.  Topical treatments such as oral throat lozenges or sprays may be used as needed.  Avoid close contact with loved ones, especially the very young and elderly.  Remember to wash your hands thoroughly throughout the day as this is the number one way to prevent the spread of infection and wipe down door knobs and counters with disinfectant.  After careful review of your answers, I would not recommend and antibiotic for your condition.  Antibiotics should not be used to treat conditions that we suspect are caused by viruses like the virus that causes the common cold or flu. However, some people can have Strep with atypical symptoms. You may need formal testing in clinic or office to confirm if your symptoms continue or worsen.  Providers prescribe antibiotics to treat infections caused by bacteria. Antibiotics are very powerful in treating bacterial infections when they are used properly.  To maintain their effectiveness, they should be used only when necessary.  Overuse of antibiotics has resulted in the development of super bugs that are resistant to treatment!    Home  Care:  Only take medications as instructed by your medical team.  Do not drink alcohol while taking these medications.  A steam or ultrasonic humidifier can help congestion.  You can place a towel over your head and breathe in the steam from hot water coming from a faucet.  Avoid close contacts especially the very young and the elderly.  Cover your mouth when you cough or sneeze.  Always remember to wash your hands.  Get Help Right Away If:  You develop worsening fever or throat pain.  You develop a severe head ache or visual changes.  Your symptoms persist after you have completed your treatment plan.  Make sure you  Understand these instructions.  Will watch your condition.  Will get help right away if you are not doing well or get worse.  Your e-visit answers were reviewed by a board certified advanced clinical practitioner to complete your personal care plan.  Depending on the condition, your plan could have included both over the counter or prescription medications.  If there is a problem please reply  once you have received a response from your provider.  Your safety is important to [place].  If you have drug allergies check your prescription carefully.    You can use MyChart to ask questions about todays visit, request a non-urgent call back, or ask for a work or school excuse for 24 hours related to this e-Visit. If it has been greater than 24 hours you will need to follow up with your provider, or enter a new  e-Visit to address those concerns.  You will get an e-mail in the next two days asking about your experience.  I hope that your e-visit has been valuable and will speed your recovery. Thank you for using e-visits.   Greater than 5 minutes, yet less than 10 minutes of time have been spent researching, coordinating and implementing care for this patient today.

## 2018-11-07 DIAGNOSIS — M9901 Segmental and somatic dysfunction of cervical region: Secondary | ICD-10-CM | POA: Diagnosis not present

## 2018-11-07 DIAGNOSIS — M9902 Segmental and somatic dysfunction of thoracic region: Secondary | ICD-10-CM | POA: Diagnosis not present

## 2018-11-07 DIAGNOSIS — M531 Cervicobrachial syndrome: Secondary | ICD-10-CM | POA: Diagnosis not present

## 2018-11-07 DIAGNOSIS — M530 Cervicocranial syndrome: Secondary | ICD-10-CM | POA: Diagnosis not present

## 2018-11-08 ENCOUNTER — Other Ambulatory Visit: Payer: Self-pay | Admitting: Obstetrics and Gynecology

## 2018-11-08 DIAGNOSIS — Z01419 Encounter for gynecological examination (general) (routine) without abnormal findings: Secondary | ICD-10-CM | POA: Diagnosis not present

## 2018-11-08 DIAGNOSIS — Z800 Family history of malignant neoplasm of digestive organs: Secondary | ICD-10-CM | POA: Diagnosis not present

## 2018-11-08 DIAGNOSIS — Z801 Family history of malignant neoplasm of trachea, bronchus and lung: Secondary | ICD-10-CM | POA: Diagnosis not present

## 2018-11-08 DIAGNOSIS — Z1231 Encounter for screening mammogram for malignant neoplasm of breast: Secondary | ICD-10-CM | POA: Diagnosis not present

## 2018-11-08 DIAGNOSIS — Z6836 Body mass index (BMI) 36.0-36.9, adult: Secondary | ICD-10-CM | POA: Diagnosis not present

## 2018-11-08 DIAGNOSIS — Z8049 Family history of malignant neoplasm of other genital organs: Secondary | ICD-10-CM | POA: Diagnosis not present

## 2018-11-08 DIAGNOSIS — N6310 Unspecified lump in the right breast, unspecified quadrant: Secondary | ICD-10-CM

## 2018-11-09 ENCOUNTER — Ambulatory Visit
Admission: RE | Admit: 2018-11-09 | Discharge: 2018-11-09 | Disposition: A | Discharge status: Home / Self Care | Payer: BC Managed Care – PPO | Source: Ambulatory Visit | Attending: Obstetrics and Gynecology | Admitting: Obstetrics and Gynecology

## 2018-11-09 ENCOUNTER — Other Ambulatory Visit: Payer: Self-pay

## 2018-11-09 DIAGNOSIS — N6310 Unspecified lump in the right breast, unspecified quadrant: Secondary | ICD-10-CM

## 2018-11-09 DIAGNOSIS — N6489 Other specified disorders of breast: Secondary | ICD-10-CM | POA: Diagnosis not present

## 2018-11-09 DIAGNOSIS — R928 Other abnormal and inconclusive findings on diagnostic imaging of breast: Secondary | ICD-10-CM | POA: Diagnosis not present

## 2018-11-21 ENCOUNTER — Other Ambulatory Visit: Payer: Self-pay

## 2018-11-21 ENCOUNTER — Ambulatory Visit: Payer: Self-pay

## 2018-11-21 DIAGNOSIS — Z23 Encounter for immunization: Secondary | ICD-10-CM

## 2018-12-04 DIAGNOSIS — M531 Cervicobrachial syndrome: Secondary | ICD-10-CM | POA: Diagnosis not present

## 2018-12-04 DIAGNOSIS — M9902 Segmental and somatic dysfunction of thoracic region: Secondary | ICD-10-CM | POA: Diagnosis not present

## 2018-12-04 DIAGNOSIS — M530 Cervicocranial syndrome: Secondary | ICD-10-CM | POA: Diagnosis not present

## 2018-12-04 DIAGNOSIS — M9901 Segmental and somatic dysfunction of cervical region: Secondary | ICD-10-CM | POA: Diagnosis not present

## 2018-12-13 DIAGNOSIS — M531 Cervicobrachial syndrome: Secondary | ICD-10-CM | POA: Diagnosis not present

## 2018-12-13 DIAGNOSIS — M530 Cervicocranial syndrome: Secondary | ICD-10-CM | POA: Diagnosis not present

## 2018-12-13 DIAGNOSIS — M9902 Segmental and somatic dysfunction of thoracic region: Secondary | ICD-10-CM | POA: Diagnosis not present

## 2018-12-13 DIAGNOSIS — M9901 Segmental and somatic dysfunction of cervical region: Secondary | ICD-10-CM | POA: Diagnosis not present

## 2018-12-31 ENCOUNTER — Encounter: Payer: Self-pay | Admitting: Family Medicine

## 2018-12-31 ENCOUNTER — Encounter: Payer: Self-pay | Admitting: Internal Medicine

## 2018-12-31 ENCOUNTER — Ambulatory Visit (INDEPENDENT_AMBULATORY_CARE_PROVIDER_SITE_OTHER): Payer: BC Managed Care – PPO | Admitting: Internal Medicine

## 2018-12-31 ENCOUNTER — Other Ambulatory Visit: Payer: Self-pay

## 2018-12-31 DIAGNOSIS — B370 Candidal stomatitis: Secondary | ICD-10-CM | POA: Diagnosis not present

## 2018-12-31 MED ORDER — FLUCONAZOLE 150 MG PO TABS: 150.0000 mg | ORAL_TABLET | Freq: Once | ORAL | 0 refills | Status: AC

## 2018-12-31 NOTE — Telephone Encounter (Signed)
Does patient need an appointment to discuss first?

## 2018-12-31 NOTE — Progress Notes (Signed)
Virtual Visit via Video Note  I connected with Sonia Martinez on 12/31/18 at  2:15 PM EST by a video enabled telemedicine application and verified that I am speaking with the correct person using two identifiers.  Location: Patient: Sonia Martinez Provider: Office   I discussed the limitations of evaluation and management by telemedicine and the availability of in person appointments. The patient expressed understanding and agreed to proceed.  History of Present Illness:  Pt reports oral thrush. She reports this occurs intermittently. She takes Advair daily but rinses her mouth out after each use. She also changes her toothbrush regularly. She had a full workup by PCP 08/2018 for the same, no cause identified. She had a negative A1C and HIV. Her pulmonary doctor decreased her Advair which has not really helped. She reports the Nystatin swish and swallow tastes terrible, she prefers Diflucan.   Past Medical History:  Diagnosis Date  . Asthma    WELL CONTROLLED  . Fatty liver   . Folliculitis 02/02/4172  . GERD (gastroesophageal reflux disease)   . Herpes simplex virus (HSV) infection 03/23/2009   Overview:  Herpes Simplex  10/1 IMO update  . History of methicillin resistant staphylococcus aureus (MRSA) 2013   + PCR SCREEN  . IBS (irritable bowel syndrome)   . Migraine without aura, without mention of intractable migraine without mention of status migrainosus 06/05/2013  . Obesity   . Oligohydramnios    2013  . Pre-eclampsia    2013  . Preeclampsia 01/05/2016  . Pregnancy induced hypertension   . Wrist joint pain 01/09/2017    Current Outpatient Medications  Medication Sig Dispense Refill  . albuterol (PROAIR HFA) 108 (90 Base) MCG/ACT inhaler Inhale 2 puffs into the lungs every 4 (four) hours as needed for wheezing or shortness of breath. 8 g 2  . Ascorbic Acid (VITAMIN C) 1000 MG tablet Take 500 mg by mouth daily.     . Bacillus Coagulans-Inulin (PROBIOTIC-PREBIOTIC PO) Take 2  tablets by mouth daily.    . benzonatate (TESSALON PERLES) 100 MG capsule Take 1-2 capsules (100-200 mg total) by mouth every 8 (eight) hours as needed for cough. 30 capsule 0  . Cholecalciferol (VITAMIN D3) 125 MCG (5000 UT) CAPS Take 1 capsule by mouth daily.    Marland Kitchen docusate sodium (COLACE) 100 MG capsule Take 100 mg by mouth daily.    . fluticasone (FLONASE) 50 MCG/ACT nasal spray USE 2 SPRAYS IN EACH NOSTRIL ONCE DAILY 16 g 5  . Fluticasone-Salmeterol (ADVAIR DISKUS) 100-50 MCG/DOSE AEPB Inhale 1 puff into the lungs 2 (two) times daily.    Marland Kitchen levocetirizine (XYZAL) 5 MG tablet TAKE 1 TABLET BY MOUTH IN THE EVENING 90 tablet 1  . Lysine 500 MG CAPS Take 1,000 mg by mouth daily.     . Magnesium 400 MG CAPS Take by mouth.    . montelukast (SINGULAIR) 10 MG tablet TAKE 1 TABLET BY MOUTH ONCE A DAY 90 tablet 3  . Multiple Vitamin (MULTIVITAMIN) tablet Take 1 tablet by mouth daily.    . Omega-3 Fatty Acids (FISH OIL) 1000 MG CAPS Take 1,000 mg by mouth daily.    Marland Kitchen omeprazole (PRILOSEC) 20 MG capsule Take 1 capsule (20 mg total) by mouth daily. 30 capsule 0  . Pyridoxine HCl (B-6) 100 MG TABS Take 100 mg by mouth daily.    . Tiotropium Bromide Monohydrate (SPIRIVA RESPIMAT) 1.25 MCG/ACT AERS Inhale 2 puffs into the lungs daily. 1 Inhaler 5  . UNABLE TO FIND Med  Name: Energy Blend - Vit B12 300mg , COQ-10 30mg , Goj Berry    . UNABLE TO FIND ed Name: Copaiba 120mg     . UNABLE TO FIND Med Name: Zendourine  (supplement)    . UNABLE TO FIND Med Name: (supplement)    . valACYclovir (VALTREX) 1000 MG tablet Take    2000mg  ( 2 tablets) every 12 hours for 2 doses. 20 tablet 1   No current facility-administered medications for this visit.     Allergies  Allergen Reactions  . Coconut Oil Hives    ANYTHING WITH COCONUT IN IT  . Dulera [Mometasone Furo-Formoterol Fum]     Patient can only take advair  . Ciprofloxacin Hives and Rash  . Hydrocortisone Hives and Rash    Family History  Problem  Relation Age of Onset  . Hypertension Mother   . Cancer - Other Mother        uterine  . Arthritis Mother   . Heart disease Maternal Grandfather   . Hyperlipidemia Maternal Grandfather   . Hypertension Maternal Grandfather   . Diabetes Paternal Grandmother   . Hyperlipidemia Paternal Grandmother   . Stroke Paternal Grandmother   . Hypertension Paternal Grandmother   . Lung cancer Paternal Grandfather   . Diabetes Paternal Grandfather   . Cancer - Other Paternal Grandfather        lung  . Hyperlipidemia Paternal Grandfather   . Hypertension Paternal Grandfather   . Hypertension Father   . Arthritis Maternal Grandmother   . Hyperlipidemia Maternal Grandmother   . Stroke Maternal Grandmother   . Hypertension Maternal Grandmother   . Anesthesia problems Neg Hx   . Malignant hyperthermia Neg Hx   . Pseudochol deficiency Neg Hx   . Hypotension Neg Hx   . Migraines Neg Hx     Social History   Socioeconomic History  . Marital status: Married    Spouse name: Not on file  . Number of children: 1  . Years of education: MA  . Highest education level: Not on file  Occupational History  . Occupation:    Comment: [employer]  Social Needs  . Financial resource strain: Not on file  . Food insecurity    Worry: Not on file    Inability: Not on file  . Transportation needs    Medical: Not on file    Non-medical: Not on file  Tobacco Use  . Smoking status: Never Smoker  . Smokeless tobacco: Never Used  Substance and Sexual Activity  . Alcohol use: No  . Drug use: No  . Sexual activity: Yes  Lifestyle  . Physical activity    Days per week: Not on file    Minutes per session: Not on file  . Stress: Not on file  Relationships  . Social Durenda Hurt on phone: Not on file    Gets together: Not on file    Attends religious service: Not on file    Active member of club or organization: Not on file    Attends meetings of clubs or organizations: Not on  file    Relationship status: Not on file  . Intimate partner violence    Fear of current or ex partner: Not on file    Emotionally abused: Not on file    Physically abused: Not on file    Forced sexual activity: Not on file  Other Topics Concern  . 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   Husband is Emergency planning/management officerpolice officer.   She enjoys cooking and reading.    No regular exercise.    Patient is right handed.   Patient has a Master's degree.   Patient drinks 2 cups daily.     Constitutional: Denies fever, malaise, fatigue, headache or abrupt weight changes.  HEENT: Pt reports coating on tongue. Denies eye pain, eye redness, ear pain, ringing in the ears, wax buildup, runny nose, nasal congestion, bloody nose, or sore throat. Respiratory: Denies difficulty breathing, shortness of breath, cough or sputum production.   Cardiovascular: Denies chest pain, chest tightness, palpitations or swelling in the hands or feet.   No other specific complaints in a complete review of systems (except as listed in HPI above).  Observations/Objective:   Wt Readings from Last 3 Encounters:  09/10/18 244 lb 6.4 oz (110.9 kg)  06/21/18 253 lb 3.2 oz (114.9 kg)  03/15/18 261 lb (118.4 kg)    General: Appears her stated age, obese, in NAD.  Throat/Mouth: White coating noted on tongue. Pulmonary/Chest: Normal effort. No respiratory distress.  Neurological: Alert and oriented.   BMET    Component Value Date/Time   NA 140 09/10/2018 1014   K 4.2 09/10/2018 1014   CL 107 09/10/2018 1014   CO2 26 09/10/2018 1014   GLUCOSE 95 09/10/2018 1014   BUN 9 09/10/2018 1014   CREATININE 0.83 09/10/2018 1014   CALCIUM 9.1 09/10/2018 1014   GFRNONAA >60 03/03/2017 0851   GFRAA >60 03/03/2017 0851    Lipid Panel     Component Value Date/Time   CHOL 134 01/09/2017 0855   TRIG 58.0  01/09/2017 0855   HDL 66.00 01/09/2017 0855   CHOLHDL 2 01/09/2017 0855   VLDL 11.6 01/09/2017 0855   LDLCALC 56 01/09/2017 0855    CBC    Component Value Date/Time   WBC 6.4 09/10/2018 1014   RBC 4.34 09/10/2018 1014   HGB 13.6 09/10/2018 1014   HCT 40.1 09/10/2018 1014   PLT 214.0 09/10/2018 1014   MCV 92.5 09/10/2018 1014   MCH 30.7 03/03/2017 0851   MCHC 33.9 09/10/2018 1014   RDW 13.1 09/10/2018 1014   LYMPHSABS 1.7 09/10/2018 1014   MONOABS 0.3 09/10/2018 1014   EOSABS 0.2 09/10/2018 1014   BASOSABS 0.0 09/10/2018 1014    Hgb A1C Lab Results  Component Value Date   HGBA1C 5.4 09/10/2018       Assessment and Plan:  Recurrent Oral Thrush:  RX for Diflucan 150 mg PO x 1 Continue to wash mouth out after using your Advair  Return precautions discussed  Follow Up Instructions:    I discussed the assessment and treatment plan with the patient. The patient was provided an opportunity to ask questions and all were answered. The patient agreed with the plan and demonstrated an understanding of the instructions.   The patient was advised to call back or seek an in-person evaluation if the symptoms worsen or if the condition fails to improve as anticipated.     Nicki Reaperegina Baity, NP

## 2018-12-31 NOTE — Patient Instructions (Signed)
Oral Thrush, Adult  Oral thrush is an infection in your mouth and throat. It causes white patches on your tongue and in your mouth. Follow these instructions at home: Helping with soreness   To lessen your pain: ? Drink cold liquids, like water and iced tea. ? Eat frozen ice pops or frozen juices. ? Eat foods that are easy to swallow, like gelatin and ice cream. ? Drink from a straw if the patches in your mouth are painful. General instructions  Take or use over-the-counter and prescription medicines only as told by your doctor. Medicine for oral thrush may be something to swallow, or it may be something to put on the infected area.  Eat plain yogurt that has live cultures in it. Read the label to make sure.  If you wear dentures: ? Take out your dentures before you go to bed. ? Brush them well. ? Soak them in a denture cleaner.  Rinse your mouth with warm salt-water many times a day. To make the salt-water mixture, completely dissolve 1/2-1 teaspoon of salt in 1 cup of warm water. Contact a doctor if:  Your problems are getting worse.  Your problems do not get better in less than 7 days with treatment.  Your infection is spreading. This may show as white patches on the skin outside of your mouth.  You are nursing your baby and you have redness and pain in the nipples. This information is not intended to replace advice given to you by your health care provider. Make sure you discuss any questions you have with your health care provider. Document Released: 04/06/2009 Document Revised: 04/14/2017 Document Reviewed: 10/05/2015 Elsevier Patient Education  2020 Elsevier Inc.  

## 2018-12-31 NOTE — Telephone Encounter (Signed)
Yes, please schedule either in person or virtually.

## 2019-01-01 DIAGNOSIS — M9902 Segmental and somatic dysfunction of thoracic region: Secondary | ICD-10-CM | POA: Diagnosis not present

## 2019-01-01 DIAGNOSIS — M9901 Segmental and somatic dysfunction of cervical region: Secondary | ICD-10-CM | POA: Diagnosis not present

## 2019-01-01 DIAGNOSIS — M530 Cervicocranial syndrome: Secondary | ICD-10-CM | POA: Diagnosis not present

## 2019-01-01 DIAGNOSIS — M531 Cervicobrachial syndrome: Secondary | ICD-10-CM | POA: Diagnosis not present

## 2019-01-08 ENCOUNTER — Other Ambulatory Visit: Payer: Self-pay | Admitting: Family Medicine

## 2019-01-23 DIAGNOSIS — M530 Cervicocranial syndrome: Secondary | ICD-10-CM | POA: Diagnosis not present

## 2019-01-23 DIAGNOSIS — M9901 Segmental and somatic dysfunction of cervical region: Secondary | ICD-10-CM | POA: Diagnosis not present

## 2019-01-23 DIAGNOSIS — M9902 Segmental and somatic dysfunction of thoracic region: Secondary | ICD-10-CM | POA: Diagnosis not present

## 2019-01-23 DIAGNOSIS — M531 Cervicobrachial syndrome: Secondary | ICD-10-CM | POA: Diagnosis not present

## 2019-01-30 ENCOUNTER — Other Ambulatory Visit: Payer: Self-pay | Admitting: Internal Medicine

## 2019-02-12 DIAGNOSIS — M9901 Segmental and somatic dysfunction of cervical region: Secondary | ICD-10-CM | POA: Diagnosis not present

## 2019-02-12 DIAGNOSIS — M531 Cervicobrachial syndrome: Secondary | ICD-10-CM | POA: Diagnosis not present

## 2019-02-12 DIAGNOSIS — M530 Cervicocranial syndrome: Secondary | ICD-10-CM | POA: Diagnosis not present

## 2019-02-12 DIAGNOSIS — M9902 Segmental and somatic dysfunction of thoracic region: Secondary | ICD-10-CM | POA: Diagnosis not present

## 2019-02-13 ENCOUNTER — Telehealth: Payer: BC Managed Care – PPO | Admitting: Family

## 2019-02-13 DIAGNOSIS — R399 Unspecified symptoms and signs involving the genitourinary system: Secondary | ICD-10-CM | POA: Diagnosis not present

## 2019-02-13 MED ORDER — CEPHALEXIN 500 MG PO CAPS: 500.0000 mg | ORAL_CAPSULE | Freq: Two times a day (BID) | ORAL | 0 refills | Status: DC

## 2019-02-13 NOTE — Progress Notes (Signed)

## 2019-02-26 ENCOUNTER — Other Ambulatory Visit: Payer: Self-pay | Admitting: Internal Medicine

## 2019-03-06 DIAGNOSIS — M531 Cervicobrachial syndrome: Secondary | ICD-10-CM | POA: Diagnosis not present

## 2019-03-06 DIAGNOSIS — M530 Cervicocranial syndrome: Secondary | ICD-10-CM | POA: Diagnosis not present

## 2019-03-06 DIAGNOSIS — M9901 Segmental and somatic dysfunction of cervical region: Secondary | ICD-10-CM | POA: Diagnosis not present

## 2019-03-06 DIAGNOSIS — M9902 Segmental and somatic dysfunction of thoracic region: Secondary | ICD-10-CM | POA: Diagnosis not present

## 2019-03-12 ENCOUNTER — Encounter: Payer: Self-pay | Admitting: Family Medicine

## 2019-03-13 ENCOUNTER — Other Ambulatory Visit: Payer: Self-pay | Admitting: Family Medicine

## 2019-03-13 DIAGNOSIS — B370 Candidal stomatitis: Secondary | ICD-10-CM

## 2019-03-13 MED ORDER — FLUCONAZOLE 100 MG PO TABS: ORAL_TABLET | ORAL | 0 refills | Status: DC

## 2019-03-19 DIAGNOSIS — M9901 Segmental and somatic dysfunction of cervical region: Secondary | ICD-10-CM | POA: Diagnosis not present

## 2019-03-19 DIAGNOSIS — M531 Cervicobrachial syndrome: Secondary | ICD-10-CM | POA: Diagnosis not present

## 2019-03-19 DIAGNOSIS — M9902 Segmental and somatic dysfunction of thoracic region: Secondary | ICD-10-CM | POA: Diagnosis not present

## 2019-03-19 DIAGNOSIS — M530 Cervicocranial syndrome: Secondary | ICD-10-CM | POA: Diagnosis not present

## 2019-03-20 DIAGNOSIS — N6310 Unspecified lump in the right breast, unspecified quadrant: Secondary | ICD-10-CM | POA: Diagnosis not present

## 2019-03-26 ENCOUNTER — Other Ambulatory Visit: Payer: Self-pay | Admitting: Internal Medicine

## 2019-03-29 ENCOUNTER — Other Ambulatory Visit: Payer: Self-pay

## 2019-03-29 ENCOUNTER — Telehealth: Payer: Self-pay | Admitting: Internal Medicine

## 2019-03-29 ENCOUNTER — Other Ambulatory Visit: Payer: Self-pay | Admitting: Internal Medicine

## 2019-03-29 DIAGNOSIS — L720 Epidermal cyst: Secondary | ICD-10-CM | POA: Diagnosis not present

## 2019-03-29 DIAGNOSIS — L309 Dermatitis, unspecified: Secondary | ICD-10-CM | POA: Diagnosis not present

## 2019-03-29 MED ORDER — ADVAIR DISKUS 100-50 MCG/DOSE IN AEPB: INHALATION_SPRAY | RESPIRATORY_TRACT | 1 refills | Status: DC

## 2019-03-29 NOTE — Telephone Encounter (Signed)
Two month supply of advair 100 has been sent to preferred pharmacy as requested by pt.  Pt is aware that she will need to keep scheduled OV for 05/14/19 for additional refills.   Nothing further is needed.

## 2019-04-03 DIAGNOSIS — M9901 Segmental and somatic dysfunction of cervical region: Secondary | ICD-10-CM | POA: Diagnosis not present

## 2019-04-03 DIAGNOSIS — M530 Cervicocranial syndrome: Secondary | ICD-10-CM | POA: Diagnosis not present

## 2019-04-03 DIAGNOSIS — M9902 Segmental and somatic dysfunction of thoracic region: Secondary | ICD-10-CM | POA: Diagnosis not present

## 2019-04-03 DIAGNOSIS — M531 Cervicobrachial syndrome: Secondary | ICD-10-CM | POA: Diagnosis not present

## 2019-04-06 ENCOUNTER — Ambulatory Visit: Payer: Self-pay | Attending: Internal Medicine

## 2019-04-06 DIAGNOSIS — Z23 Encounter for immunization: Secondary | ICD-10-CM

## 2019-04-06 NOTE — Progress Notes (Signed)
   Covid-19 Vaccination Clinic  Name:  Sonia Martinez    MRN: [id number] DOB: 1980/12/29  04/06/2019  [name] was observed post Covid-19 immunization for 15 minutes without incident. She was provided with Vaccine Information Sheet and instruction to access the V-Safe system.   [name] was instructed to call 911 with any severe reactions post vaccine: Marland Kitchen Difficulty breathing  . Swelling of face and throat  . A fast heartbeat  . A bad rash all over body  . Dizziness and weakness   Immunizations Administered    Name Date Dose VIS Date Route   Pfizer COVID-19 Vaccine 04/06/2019  8:18 AM 0.3 mL 01/04/2019 Intramuscular   Manufacturer: [employer], [employer]   Lot: [id number]   NDC: [address]-1

## 2019-04-24 DIAGNOSIS — M9902 Segmental and somatic dysfunction of thoracic region: Secondary | ICD-10-CM | POA: Diagnosis not present

## 2019-04-24 DIAGNOSIS — M530 Cervicocranial syndrome: Secondary | ICD-10-CM | POA: Diagnosis not present

## 2019-04-24 DIAGNOSIS — M9901 Segmental and somatic dysfunction of cervical region: Secondary | ICD-10-CM | POA: Diagnosis not present

## 2019-04-24 DIAGNOSIS — M531 Cervicobrachial syndrome: Secondary | ICD-10-CM | POA: Diagnosis not present

## 2019-04-30 ENCOUNTER — Ambulatory Visit: Payer: Self-pay | Attending: Internal Medicine

## 2019-04-30 DIAGNOSIS — Z23 Encounter for immunization: Secondary | ICD-10-CM

## 2019-04-30 NOTE — Progress Notes (Signed)
   Covid-19 Vaccination Clinic  Name:  Sonia Martinez    MRN: [id number] DOB: [birth year]  04/30/2019  [name] was observed post Covid-19 immunization for 15 minutes without incident. She was provided with Vaccine Information Sheet and instruction to access the V-Safe system.   [name] was instructed to call 911 with any severe reactions post vaccine: Marland Kitchen Difficulty breathing  . Swelling of face and throat  . A fast heartbeat  . A bad rash all over body  . Dizziness and weakness   Immunizations Administered    Name Date Dose VIS Date Route   Pfizer COVID-19 Vaccine 04/30/2019 10:21 AM 0.3 mL 01/04/2019 Intramuscular   Manufacturer: [employer], [employer]   Lot: [id number]   NDC: [address]-9

## 2019-05-09 ENCOUNTER — Telehealth: Payer: BC Managed Care – PPO | Admitting: Family

## 2019-05-09 DIAGNOSIS — R12 Heartburn: Secondary | ICD-10-CM

## 2019-05-09 MED ORDER — OMEPRAZOLE 40 MG PO CPDR: 40.0000 mg | DELAYED_RELEASE_CAPSULE | Freq: Every day | ORAL | 3 refills | Status: DC

## 2019-05-09 NOTE — Progress Notes (Signed)
We are sorry that you are not feeling well.  Here is how we plan to help!  Based on what you shared with me it looks like you most likely have Gastroesophageal Reflux Disease (GERD)  Gastroesophageal reflux disease (GERD) happens when acid from your stomach flows up into the esophagus.  When acid comes in contact with the esophagus, the acid causes sorenss (inflammation) in the esophagus.  Over time, GERD may create small holes (ulcers) in the lining of the esophagus.  I have prescribed Omeprazole 40 mg 1 tab once daily. See you primary care provider   Your symptoms should improve in the next day or two.  You can use antacids as needed until symptoms resolve.  Call us if your heartburn worsens, you have trouble swallowing, weight loss, spitting up blood or recurrent vomiting.  Home Care:  May include lifestyle changes such as weight loss, quitting smoking and alcohol consumption  Avoid foods and drinks that make your symptoms worse, such as:  Caffeine or alcoholic drinks  Chocolate  Peppermint or mint flavorings  Garlic and onions  Spicy foods  Citrus fruits, such as oranges, lemons, or limes  Tomato-based foods such as sauce, chili, salsa and pizza  Fried and fatty foods  Avoid lying down for 3 hours prior to your bedtime or prior to taking a nap  Eat small, frequent meals instead of a large meals  Wear loose-fitting clothing.  Do not wear anything tight around your waist that causes pressure on your stomach.  Raise the head of your bed 6 to 8 inches with wood blocks to help you sleep.  Extra pillows will not help.  Seek Help Right Away If:  You have pain in your arms, neck, jaw, teeth or back  Your pain increases or changes in intensity or duration  You develop nausea, vomiting or sweating (diaphoresis)  You develop shortness of breath or you faint  Your vomit is green, yellow, black or looks like coffee grounds or blood  Your stool is red, bloody or  black  These symptoms could be signs of other problems, such as heart disease, gastric bleeding or esophageal bleeding.  Make sure you :  Understand these instructions.  Will watch your condition.  Will get help right away if you are not doing well or get worse.  Your e-visit answers were reviewed by a board certified advanced clinical practitioner to complete your personal care plan.  Depending on the condition, your plan could have included both over the counter or prescription medications.  If there is a problem please reply  once you have received a response from your provider.  Your safety is important to [place].  If you have drug allergies check your prescription carefully.    You can use MyChart to ask questions about today's visit, request a non-urgent call back, or ask for a work or school excuse for 24 hours related to this e-Visit. If it has been greater than 24 hours you will need to follow up with your provider, or enter a new e-Visit to address those concerns.  You will get an e-mail in the next two days asking about your experience.  I hope that your e-visit has been valuable and will speed your recovery. Thank you for using e-visits.  Greater than 5 minutes, yet less than 10 minutes of time have been spent researching, coordinating, and implementing care for this patient today.  Thank you for the details you included in the comment boxes. Those details  are very helpful in determining the best course of treatment for you and help [place] to provide the best care.

## 2019-05-14 ENCOUNTER — Encounter: Payer: Self-pay | Admitting: Internal Medicine

## 2019-05-14 ENCOUNTER — Other Ambulatory Visit: Payer: Self-pay

## 2019-05-14 ENCOUNTER — Ambulatory Visit (INDEPENDENT_AMBULATORY_CARE_PROVIDER_SITE_OTHER): Payer: BC Managed Care – PPO | Admitting: Internal Medicine

## 2019-05-14 VITALS — BP 118/72 | HR 84 | Temp 97.7°F | Ht 67.5 in | Wt 219.2 lb

## 2019-05-14 DIAGNOSIS — J4520 Mild intermittent asthma, uncomplicated: Secondary | ICD-10-CM

## 2019-05-14 NOTE — Progress Notes (Signed)
Name: Sonia Martinez MRN: [id number] DOB: 1980-05-12     CONSULTATION DATE: 2.20.20 REFERRING MD : Garnette Gunner   STUDIES:    2.6.20  CXR independently reviewed by Me No pneumonia No infiltrates No edema  2.20.20  Spirometry at this time show any evidence of obstructive lung disease Normal ratio normal FEV1  NL chest radiograph     CHIEF COMPLAINT:  Follow-up asthma     HISTORY OF PRESENT ILLNESS: Patient feels much better since losing 50 pounds Previously weight 267 pounds down to 217 pounds Patient only uses albuterol as needed for running Patient was switched to Advair 100/50 twice daily  At this time we talked about stopping Advair and assessing her respiratory status Patient is very active at this time has increased exercise tolerance She is very happy with her weight loss Patient is doing extremely well  No  exacerbation at this time No evidence of heart failure at this time No evidence or signs of infection at this time No respiratory distress No fevers, chills, nausea, vomiting, diarrhea No evidence of lower extremity edema No evidence hemoptysis     PAST MEDICAL HISTORY :   has a past medical history of Asthma, Fatty liver, Folliculitis (0/02/7739), GERD (gastroesophageal reflux disease), Herpes simplex virus (HSV) infection (03/23/2009), History of methicillin resistant staphylococcus aureus (MRSA) (2013), IBS (irritable bowel syndrome), Migraine without aura, without mention of intractable migraine without mention of status migrainosus (06/05/2013), Obesity, Oligohydramnios, Pre-eclampsia, Preeclampsia (01/05/2016), Pregnancy induced hypertension, and Wrist joint pain (01/09/2017).  has a past surgical history that includes Wisdom tooth extraction; Septoplasty; Cholecystectomy (N/A, 02/24/2017); and Liver biopsy (N/A, 02/24/2017). Prior to Admission medications   Medication Sig Start Date End Date Taking? Authorizing Provider  ADVAIR DISKUS 250-50  MCG/DOSE AEPB INHALE ONE PUFF INTO THE LUNGS TWICE DAILY 01/31/18   Elby Beck, FNP  albuterol (PROAIR HFA) 108 (90 Base) MCG/ACT inhaler Inhale 2 puffs into the lungs every 4 (four) hours as needed for wheezing or shortness of breath. 10/20/17   Elby Beck, FNP  Ascorbic Acid (VITAMIN C) 1000 MG tablet Take 1,000 mg by mouth daily.    [provider]  Bacillus Coagulans-Inulin (PROBIOTIC-PREBIOTIC PO) Take 2 tablets by mouth daily.    [provider]  benzonatate (TESSALON PERLES) 100 MG capsule Take 1 capsule (100 mg total) by mouth 3 (three) times daily as needed. 02/15/18   Talmage Nap, PA-C  Cholecalciferol 2000 units CAPS Take by mouth.    [provider]  docusate sodium (COLACE) 100 MG capsule Take 100 mg by mouth daily.    [provider]  doxycycline (VIBRA-TABS) 100 MG tablet Take 1 tablet (100 mg total) by mouth 2 (two) times daily. 03/02/18   Jearld Fenton, NP  fluticasone (FLONASE) 50 MCG/ACT nasal spray USE 2 SPRAYS IN [facility] NOSTRIL ONCE DAILY 11/08/17   Elby Beck, FNP  Fluticasone-Salmeterol (ADVAIR DISKUS) 250-50 MCG/DOSE AEPB Inhale 1 puff into the lungs 2 (two) times daily. 12/05/17   Elby Beck, FNP  levocetirizine (XYZAL ALLERGY 24HR) 5 MG tablet Take 1 tablet (5 mg total) by mouth every evening. 10/20/17   Elby Beck, FNP  loratadine (CLARITIN) 10 MG tablet Take 10 mg by mouth at bedtime.     [provider]  Lysine 500 MG CAPS Take 500 mg by mouth daily.    [provider]  Magnesium 400 MG CAPS Take by mouth.    [provider]  montelukast (SINGULAIR) 10  MG tablet TAKE 1 TABLET BY MOUTH ONCE A DAY 01/15/18   Emi Belfast, FNP  Multiple Vitamin (MULTIVITAMIN) tablet Take 1 tablet by mouth daily.    [provider]  Omega-3 Fatty Acids (FISH OIL) 1000 MG CAPS Take 1,000 mg by mouth daily.    [provider]  predniSONE (DELTASONE) 10 MG tablet Take  6 tabs day 1, 5 tabs day 2, 4 tabs day 3, 3 tabs day 4, 2 tabs day 5, 1 tab day 6 03/01/18   Lorre Munroe, NP  Pyridoxine HCl (B-6) 100 MG TABS Take 100 mg by mouth daily.    [provider]  valACYclovir (VALTREX) 1000 MG tablet Take    2000mg  ( 2 tablets) every 12 hours for 2 doses. 02/12/18   02/14/18, MD  valACYclovir (VALTREX) 500 MG tablet Take as directed 09/02/14   [provider]   Allergies  Allergen Reactions  . Coconut Oil Hives    ANYTHING WITH COCONUT IN IT  . Dulera [Mometasone Furo-Formoterol Fum]     Patient can only take advair  . Ciprofloxacin Hives and Rash  . Hydrocortisone Hives and Rash    Review of Systems:  Gen:  Denies  fever, sweats, chills weight loss  HEENT: Denies blurred vision, double vision, ear pain, eye pain, hearing loss, nose bleeds, sore throat Cardiac:  No dizziness, chest pain or heaviness, chest tightness,edema, No JVD Resp:   No cough, -sputum production, -shortness of breath,-wheezing, -hemoptysis,  Gi: Denies swallowing difficulty, stomach pain, nausea or vomiting, diarrhea, constipation, bowel incontinence Gu:  Denies bladder incontinence, burning urine Ext:   Denies Joint pain, stiffness or swelling Skin: Denies  skin rash, easy bruising or bleeding or hives Endoc:  Denies polyuria, polydipsia , polyphagia or weight change Psych:   Denies depression, insomnia or hallucinations  Other:  All other systems negative   ALL OTHER ROS ARE NEGATIVE   BP 118/72 (BP Location: Left Arm, Cuff Size: Normal)   Pulse 84   Temp 97.7 F (36.5 C) (Temporal)   Ht 5' 7.5" (1.715 m)   Wt 219 lb 3.2 oz (99.4 kg)   SpO2 99%   BMI 33.82 kg/m    Physical Examination:   General Appearance: No distress  Neuro:without focal findings,  speech normal,  HEENT: PERRLA, EOM intact.   Pulmonary: normal breath sounds, No wheezing.  CardiovascularNormal S1,S2.  No m/r/g.   Abdomen: Benign, Soft, non-tender. Renal:  No  costovertebral tenderness  GU:  Not performed at this time. Endoc: No evident thyromegaly Skin:   warm, no rashes, no ecchymosis  Extremities: normal, no cyanosis, clubbing. PSYCHIATRIC: Mood, affect within normal limits.   ALL OTHER ROS ARE NEGATIVE      ASSESSMENT / PLAN:  39 year old pleasant white female seen today for follow-up assessment for reactive airways disease in the setting of acute on chronic bronchitis with moderately persistent asthma with underlying obesity and deconditioned state with GERD and chronic allergic rhinitis  Patient has been reclassified to MILD INTERMITTANT asthma Continue albuterol as needed 2 to 4 puffs prior to running Avoid triggers    Allergic rhinitis Continue Flonase as prescribed Continue Singulair Continue antihistamine   Cough Robitussin as needed  Overweight Patient continues to lose significant amount of weight She has lost 50 pounds over 1 year She is doing fantastic and feeling great Respiratory symptoms have significantly improved   Deconditioned state -Recommend increased daily activity and exercise    COVID-19 EDUCATION: The signs  and symptoms of COVID-19 were discussed with the patient and how to seek care for testing.  The importance of social distancing was discussed today. Hand Washing Techniques and avoid touching face was advised.  MEDICATION ADJUSTMENTS/LABS AND TESTS ORDERED: AVOID TRIGGERS RECOMMEND WEIGHT LOSS  CURRENT MEDICATIONS REVIEWED AT LENGTH WITH PATIENT TODAY   Patientare satisfied with Plan of action and management. All questions answered Follow up in 1 year  Total time spent 22 minutes  Wallis Bamberg Santiago Glad, M.D.  [facility]  Medical Director [facility] Medical Director [facility] Cardio-Pulmonary Department

## 2019-05-14 NOTE — Patient Instructions (Addendum)
AVOID TRIGGERS RECOMMEND WEIGHT LOSS ALBUTEROL AS NEEDED 2-4 puffs prior to running

## 2019-05-16 ENCOUNTER — Encounter: Payer: Self-pay | Admitting: Family Medicine

## 2019-05-16 NOTE — Telephone Encounter (Signed)
Sonia Martinez, does patient need an appointment to discuss?

## 2019-05-20 ENCOUNTER — Encounter: Payer: Self-pay | Admitting: Family Medicine

## 2019-05-20 ENCOUNTER — Ambulatory Visit: Payer: BC Managed Care – PPO | Admitting: Family Medicine

## 2019-05-20 ENCOUNTER — Other Ambulatory Visit: Payer: Self-pay

## 2019-05-20 VITALS — BP 110/60 | HR 63 | Temp 98.6°F | Ht 67.5 in | Wt 223.0 lb

## 2019-05-20 DIAGNOSIS — K219 Gastro-esophageal reflux disease without esophagitis: Secondary | ICD-10-CM | POA: Diagnosis not present

## 2019-05-20 DIAGNOSIS — J309 Allergic rhinitis, unspecified: Secondary | ICD-10-CM

## 2019-05-20 DIAGNOSIS — K136 Irritative hyperplasia of oral mucosa: Secondary | ICD-10-CM | POA: Diagnosis not present

## 2019-05-20 NOTE — Patient Instructions (Signed)
Good to see you today  Follow up with ENT if symptoms persist

## 2019-05-20 NOTE — Progress Notes (Signed)
   Subjective:    Patient ID: Sonia Martinez, female    DOB: 07-05-1980, 39 y.o.   MRN: [id number]  HPI Chief Complaint  Patient presents with  . Thrush    x5 days, dry mouth    This is a 39 yo female who presents today for recurrence of thrush. Seems to have every couple of months. Had ENT referral 7/20 and subsequent negative blood work up. Wears a mouth guard at night, doesn't cover palate, no s/s sleep apnea. No vaginitis, no skin rashes.  Current symptoms started about 5 days ago. Noticed some dry tongue, cotton mouth, dry/ irritated throat. Recently came off steroid inhaler. Has regular dental care- no problems.  Had virtual visit 11 days ago for worsening GERD. Had been taking omeprazole 10 mg daily, this was increased to 20 mg and she has had significant improvement of symptoms.  Allergies very bad now, takes montelukast, Xyzol, flonase daily. Has increased dry mouth and headaches with Xyzol. Usually takes claritin but not strong enough this time of year.   Review of Systems Per HPI    Objective:   Physical Exam Vitals reviewed.  Constitutional:      General: She is not in acute distress.    Appearance: Normal appearance. She is obese. She is not ill-appearing, toxic-appearing or diaphoretic.  HENT:     Head: Normocephalic.     Mouth/Throat:     Mouth: Mucous membranes are moist.     Pharynx: Oropharynx is clear. Posterior oropharyngeal erythema (uvula, pharyngeal pillars) present. No oropharyngeal exudate.     Comments: No lesions/ rash on tongue, buccal membranes.  Eyes:     Conjunctiva/sclera: Conjunctivae normal.  Neurological:     Mental Status: She is alert.       BP 110/60 (BP Location: Left Arm, Patient Position: Sitting, Cuff Size: Large)   Pulse 63   Temp 98.6 F (37 C) (Temporal)   Ht 5' 7.5" (1.715 m)   Wt 223 lb (101.2 kg)   SpO2 98%   BMI 34.41 kg/m  Wt Readings from Last 3 Encounters:  05/20/19 223 lb (101.2 kg)  05/14/19 219 lb 3.2 oz  (99.4 kg)  09/10/18 244 lb 6.4 oz (110.9 kg)       Assessment & Plan:  1. Irritation of oral cavity - she has mild erythema of uvula, ? Related to GERD. Continue omeprazole 20 mg qd, may also be from overdrying due antihistamines, ? Mouth breathing at night - no s/s recurrent thrush - discussed symptomatic measures, follow up with ENT if no improvement of symptoms  2. Gastroesophageal reflux disease, unspecified whether esophagitis present - improvement on omeprazole 20 mg - avoid triggers  3. Allergic rhinitis, unspecified seasonality, unspecified trigger - discussed change of antihistamine to see if less drying   This visit occurred during the SARS-CoV-2 public health emergency.  Safety protocols were in place, including screening questions prior to the visit, additional usage of staff PPE, and extensive cleaning of exam room while observing appropriate contact time as indicated for disinfecting solutions.      Olean Ree, FNP-BC  [facility], [facility]  05/20/2019 5:24 PM

## 2019-05-22 DIAGNOSIS — M9902 Segmental and somatic dysfunction of thoracic region: Secondary | ICD-10-CM | POA: Diagnosis not present

## 2019-05-22 DIAGNOSIS — M531 Cervicobrachial syndrome: Secondary | ICD-10-CM | POA: Diagnosis not present

## 2019-05-22 DIAGNOSIS — M9901 Segmental and somatic dysfunction of cervical region: Secondary | ICD-10-CM | POA: Diagnosis not present

## 2019-05-22 DIAGNOSIS — M530 Cervicocranial syndrome: Secondary | ICD-10-CM | POA: Diagnosis not present

## 2019-05-31 ENCOUNTER — Other Ambulatory Visit: Payer: Self-pay | Admitting: Family Medicine

## 2019-06-26 DIAGNOSIS — M530 Cervicocranial syndrome: Secondary | ICD-10-CM | POA: Diagnosis not present

## 2019-06-26 DIAGNOSIS — M531 Cervicobrachial syndrome: Secondary | ICD-10-CM | POA: Diagnosis not present

## 2019-06-26 DIAGNOSIS — M9902 Segmental and somatic dysfunction of thoracic region: Secondary | ICD-10-CM | POA: Diagnosis not present

## 2019-06-26 DIAGNOSIS — M9901 Segmental and somatic dysfunction of cervical region: Secondary | ICD-10-CM | POA: Diagnosis not present

## 2019-07-01 DIAGNOSIS — L718 Other rosacea: Secondary | ICD-10-CM | POA: Diagnosis not present

## 2019-07-24 DIAGNOSIS — M9901 Segmental and somatic dysfunction of cervical region: Secondary | ICD-10-CM | POA: Diagnosis not present

## 2019-07-24 DIAGNOSIS — M531 Cervicobrachial syndrome: Secondary | ICD-10-CM | POA: Diagnosis not present

## 2019-07-24 DIAGNOSIS — M9902 Segmental and somatic dysfunction of thoracic region: Secondary | ICD-10-CM | POA: Diagnosis not present

## 2019-07-24 DIAGNOSIS — M530 Cervicocranial syndrome: Secondary | ICD-10-CM | POA: Diagnosis not present

## 2019-07-31 DIAGNOSIS — J301 Allergic rhinitis due to pollen: Secondary | ICD-10-CM | POA: Diagnosis not present

## 2019-07-31 DIAGNOSIS — H6981 Other specified disorders of Eustachian tube, right ear: Secondary | ICD-10-CM | POA: Diagnosis not present

## 2019-07-31 DIAGNOSIS — H93291 Other abnormal auditory perceptions, right ear: Secondary | ICD-10-CM | POA: Diagnosis not present

## 2019-08-21 DIAGNOSIS — M530 Cervicocranial syndrome: Secondary | ICD-10-CM | POA: Diagnosis not present

## 2019-08-21 DIAGNOSIS — M531 Cervicobrachial syndrome: Secondary | ICD-10-CM | POA: Diagnosis not present

## 2019-08-21 DIAGNOSIS — M9901 Segmental and somatic dysfunction of cervical region: Secondary | ICD-10-CM | POA: Diagnosis not present

## 2019-08-21 DIAGNOSIS — M9902 Segmental and somatic dysfunction of thoracic region: Secondary | ICD-10-CM | POA: Diagnosis not present

## 2019-09-10 ENCOUNTER — Other Ambulatory Visit: Payer: Self-pay | Admitting: Family

## 2019-09-16 ENCOUNTER — Encounter: Payer: Self-pay | Admitting: Family Medicine

## 2019-09-16 MED ORDER — OMEPRAZOLE 40 MG PO CPDR: 40.0000 mg | DELAYED_RELEASE_CAPSULE | Freq: Every day | ORAL | 2 refills | Status: DC

## 2019-09-16 NOTE — Telephone Encounter (Signed)
Refill sent.

## 2019-09-18 DIAGNOSIS — M9902 Segmental and somatic dysfunction of thoracic region: Secondary | ICD-10-CM | POA: Diagnosis not present

## 2019-09-18 DIAGNOSIS — M531 Cervicobrachial syndrome: Secondary | ICD-10-CM | POA: Diagnosis not present

## 2019-09-18 DIAGNOSIS — M9901 Segmental and somatic dysfunction of cervical region: Secondary | ICD-10-CM | POA: Diagnosis not present

## 2019-09-18 DIAGNOSIS — M530 Cervicocranial syndrome: Secondary | ICD-10-CM | POA: Diagnosis not present

## 2019-09-25 DIAGNOSIS — Z30432 Encounter for removal of intrauterine contraceptive device: Secondary | ICD-10-CM | POA: Diagnosis not present

## 2019-10-15 ENCOUNTER — Other Ambulatory Visit: Payer: Self-pay | Admitting: Family Medicine

## 2019-10-15 NOTE — Telephone Encounter (Addendum)
Pt has upcoming appt 11/08/2019. Refill 01/09/19 #90 with 3 refills.

## 2019-10-23 DIAGNOSIS — M530 Cervicocranial syndrome: Secondary | ICD-10-CM | POA: Diagnosis not present

## 2019-10-23 DIAGNOSIS — M531 Cervicobrachial syndrome: Secondary | ICD-10-CM | POA: Diagnosis not present

## 2019-10-23 DIAGNOSIS — M9902 Segmental and somatic dysfunction of thoracic region: Secondary | ICD-10-CM | POA: Diagnosis not present

## 2019-10-23 DIAGNOSIS — M9901 Segmental and somatic dysfunction of cervical region: Secondary | ICD-10-CM | POA: Diagnosis not present

## 2019-11-08 ENCOUNTER — Other Ambulatory Visit: Payer: Self-pay

## 2019-11-08 ENCOUNTER — Ambulatory Visit (INDEPENDENT_AMBULATORY_CARE_PROVIDER_SITE_OTHER): Payer: BC Managed Care – PPO | Admitting: Family Medicine

## 2019-11-08 ENCOUNTER — Encounter: Payer: Self-pay | Admitting: Family Medicine

## 2019-11-08 VITALS — BP 102/64 | HR 60 | Temp 97.4°F | Ht 67.0 in | Wt 214.5 lb

## 2019-11-08 DIAGNOSIS — Z0000 Encounter for general adult medical examination without abnormal findings: Secondary | ICD-10-CM

## 2019-11-08 DIAGNOSIS — E669 Obesity, unspecified: Secondary | ICD-10-CM

## 2019-11-08 DIAGNOSIS — E559 Vitamin D deficiency, unspecified: Secondary | ICD-10-CM

## 2019-11-08 DIAGNOSIS — K219 Gastro-esophageal reflux disease without esophagitis: Secondary | ICD-10-CM | POA: Diagnosis not present

## 2019-11-08 LAB — CBC WITH DIFFERENTIAL/PLATELET
Basophils Absolute: 0 10*3/uL (ref 0.0–0.1)
Basophils Relative: 0.5 % (ref 0.0–3.0)
Eosinophils Absolute: 0.2 10*3/uL (ref 0.0–0.7)
Eosinophils Relative: 3.5 % (ref 0.0–5.0)
HCT: 40.1 % (ref 36.0–46.0)
Hemoglobin: 13.6 g/dL (ref 12.0–15.0)
Lymphocytes Relative: 31.6 % (ref 12.0–46.0)
Lymphs Abs: 2 10*3/uL (ref 0.7–4.0)
MCHC: 33.8 g/dL (ref 30.0–36.0)
MCV: 93.1 fl (ref 78.0–100.0)
Monocytes Absolute: 0.4 10*3/uL (ref 0.1–1.0)
Monocytes Relative: 6.3 % (ref 3.0–12.0)
Neutro Abs: 3.6 10*3/uL (ref 1.4–7.7)
Neutrophils Relative %: 58.1 % (ref 43.0–77.0)
Platelets: 203 10*3/uL (ref 150.0–400.0)
RBC: 4.3 Mil/uL (ref 3.87–5.11)
RDW: 13.1 % (ref 11.5–15.5)
WBC: 6.2 10*3/uL (ref 4.0–10.5)

## 2019-11-08 LAB — COMPREHENSIVE METABOLIC PANEL
ALT: 30 U/L (ref 0–35)
AST: 24 U/L (ref 0–37)
Albumin: 4 g/dL (ref 3.5–5.2)
Alkaline Phosphatase: 62 U/L (ref 39–117)
BUN: 13 mg/dL (ref 6–23)
CO2: 29 mEq/L (ref 19–32)
Calcium: 8.9 mg/dL (ref 8.4–10.5)
Chloride: 105 mEq/L (ref 96–112)
Creatinine, Ser: 0.77 mg/dL (ref 0.40–1.20)
GFR: 98.67 mL/min (ref >60.00)
Glucose, Bld: 90 mg/dL (ref 70–99)
Potassium: 4.5 mEq/L (ref 3.5–5.1)
Sodium: 139 mEq/L (ref 135–145)
Total Bilirubin: 0.8 mg/dL (ref 0.2–1.2)
Total Protein: 6.5 g/dL (ref 6.0–8.3)

## 2019-11-08 LAB — LIPID PANEL
Cholesterol: 176 mg/dL (ref 0–200)
HDL: 67.2 mg/dL (ref >39.00)
LDL Cholesterol: 99 mg/dL (ref 0–99)
NonHDL: 109.02
Total CHOL/HDL Ratio: 3
Triglycerides: 52 mg/dL (ref 0.0–149.0)
VLDL: 10.4 mg/dL (ref 0.0–40.0)

## 2019-11-08 LAB — VITAMIN D 25 HYDROXY (VIT D DEFICIENCY, FRACTURES): VITD: 31.84 ng/mL (ref 30.00–100.00)

## 2019-11-08 LAB — HEMOGLOBIN A1C: Hgb A1c MFr Bld: 5.5 % (ref 4.6–6.5)

## 2019-11-08 LAB — TSH: TSH: 0.66 u[IU]/mL (ref 0.35–4.50)

## 2019-11-08 MED ORDER — OMEPRAZOLE 20 MG PO CPDR: 20.0000 mg | DELAYED_RELEASE_CAPSULE | Freq: Every day | ORAL | 3 refills | Status: AC

## 2019-11-08 MED ORDER — OMEPRAZOLE 20 MG PO CPDR: 20.0000 mg | DELAYED_RELEASE_CAPSULE | Freq: Every day | ORAL | 3 refills | Status: DC

## 2019-11-08 NOTE — Patient Instructions (Signed)
Good to see you today  I have sent in lower dose of omperazole, goal is to wean if able, take for 90 days then try to go to every other day

## 2019-11-08 NOTE — Progress Notes (Signed)
Subjective:    Patient ID: Sonia Martinez, female    DOB: 06-05-1980, 39 y.o.   MRN: [id number]  HPI Chief Complaint  Patient presents with  . Annual Exam     scheduled pap test 11/13/2019, mammogram10/2020   This is a 39 yo female who presents today for annual exam.     Last CPE-09/10/2018 Pap-upcoming GYN appointment 11/13/19 Tdap-unsure, will have today Flu-annual Covid 19 vaccine-fully vaccinated Eye-for several years Dental-regular Exercise-trying to hit step count, 6 days a week of exercise.  Diet- tracking her food intake, decreased white foods, increased vegetables. Snacks- jerky, fruit, occasional protein bars.   Genella Rife- still on omeprazole 40 mg, stopped, started getting hoarse, saw ENT, had some polyps. Recommended she stay on omeprazole (year ago).   Asthma, well controlled since she has lost weight.   Review of Systems  Constitutional: Negative.   HENT: Negative.   Eyes: Negative.   Respiratory: Negative.   Cardiovascular: Negative.   Gastrointestinal: Positive for abdominal pain (occasional gerd). Negative for constipation and diarrhea.  Endocrine: Negative.   Genitourinary: Negative.   Musculoskeletal: Negative.   Skin: Negative.   Allergic/Immunologic: Negative.   Neurological: Positive for headaches (improved).  Hematological: Negative.   Psychiatric/Behavioral: Negative.        Objective:   Physical Exam Physical Exam  Constitutional: She is oriented to person, place, and time. She appears well-developed and well-nourished. No distress.  HENT:  Head: Normocephalic and atraumatic.  Right Ear: External ear normal. TM normal.  Left Ear: External ear normal. TM normal.  Nose: Nose normal.  Mouth/Throat: Oropharynx is clear and moist. No oropharyngeal exudate.  Eyes: Conjunctivae are normal.   Neck: Normal range of motion. Neck supple. No JVD present. No thyromegaly present.  Cardiovascular: Normal rate, regular rhythm, normal heart sounds  and intact distal pulses.   Pulmonary/Chest: Effort normal and breath sounds normal. Right breast exhibits no inverted nipple, no mass, no nipple discharge, no skin change and no tenderness. Left breast exhibits no inverted nipple, no mass, no nipple discharge, no skin change and no tenderness. Breasts are symmetrical.  Abdominal: Soft. Bowel sounds are normal. She exhibits no distension and no mass. There is no tenderness. There is no rebound and no guarding.  Musculoskeletal: Normal range of motion. She exhibits no edema or tenderness.  Lymphadenopathy:    She has no cervical adenopathy.  Neurological: She is alert and oriented to person, place, and time.   Skin: Skin is warm and dry. She is not diaphoretic.  Psychiatric: She has a normal mood and affect. Her behavior is normal. Judgment and thought content normal.  Vitals reviewed.     BP 102/64   Pulse 60   Temp (!) 97.4 F (36.3 C) (Temporal)   Ht 5\' 7"  (1.702 m)   Wt 214 lb 8 oz (97.3 kg)   LMP 10/30/2019 (Exact Date)   SpO2 96%   BMI 33.60 kg/m  Wt Readings from Last 3 Encounters:  11/08/19 214 lb 8 oz (97.3 kg)  05/20/19 223 lb (101.2 kg)  05/14/19 219 lb 3.2 oz (99.4 kg)       Assessment & Plan:  1. Annual physical exam - Discussed and encouraged healthy lifestyle choices- adequate sleep, regular exercise, stress management and healthy food choices.    2. Obesity (BMI 35.0-39.9 without comorbidity) -Is making progress with diet change and increased exercise, encouraged her efforts - Lipid Panel - TSH - Hemoglobin A1c - CBC with Differential - Comprehensive metabolic panel -  Vitamin D, 25-hydroxy  3. Vitamin D deficiency - Vitamin D, 25-hydroxy  4. Gastroesophageal reflux disease, unspecified whether esophagitis present -Discussed possible side effects of long-term PPI use, will try to decrease her omeprazole from 40 mg to 20 mg with a slow wean and then go to every other day if tolerated - omeprazole  (PRILOSEC) 20 MG capsule; Take 1 capsule (20 mg total) by mouth daily.  Dispense: 90 capsule; Refill: 3  This visit occurred during the SARS-CoV-2 public health emergency.  Safety protocols were in place, including screening questions prior to the visit, additional usage of staff PPE, and extensive cleaning of exam room while observing appropriate contact time as indicated for disinfecting solutions.    Olean Ree, FNP-BC  [facility] at [facility], [facility]  11/08/2019 5:51 PM

## 2019-11-12 ENCOUNTER — Telehealth: Payer: BC Managed Care – PPO | Admitting: Physician Assistant

## 2019-11-12 DIAGNOSIS — R059 Cough, unspecified: Secondary | ICD-10-CM

## 2019-11-12 MED ORDER — BENZONATATE 100 MG PO CAPS: 100.0000 mg | ORAL_CAPSULE | Freq: Three times a day (TID) | ORAL | 0 refills | Status: AC

## 2019-11-12 MED ORDER — PREDNISONE 10 MG (21) PO TBPK: ORAL_TABLET | Freq: Every day | ORAL | 0 refills | Status: DC

## 2019-11-12 NOTE — Progress Notes (Signed)
We are sorry that you are not feeling well.  Here is how we plan to help!  Based on your presentation I believe you most likely have A cough due to a virus.  This is called viral bronchitis and is best treated by rest, plenty of fluids and control of the cough.  You may use Ibuprofen or Tylenol as directed to help your symptoms.     In addition you may use A prescription cough medication called Tessalon Perles 100mg . You may take 1-2 capsules every 8 hours as needed for your cough.  I have also written a prescription for Prednisone. Please take as directed.   From your responses in the eVisit questionnaire you describe inflammation in the upper respiratory tract which is causing a significant cough.  This is commonly called Bronchitis and has four common causes:    Allergies  Viral Infections  Acid Reflux  Bacterial Infection Allergies, viruses and acid reflux are treated by controlling symptoms or eliminating the cause. An example might be a cough caused by taking certain blood pressure medications. You stop the cough by changing the medication. Another example might be a cough caused by acid reflux. Controlling the reflux helps control the cough.  USE OF BRONCHODILATOR ("RESCUE") INHALERS: There is a risk from using your bronchodilator too frequently.  The risk is that over-reliance on a medication which only relaxes the muscles surrounding the breathing tubes can reduce the effectiveness of medications prescribed to reduce swelling and congestion of the tubes themselves.  Although you feel brief relief from the bronchodilator inhaler, your asthma may actually be worsening with the tubes becoming more swollen and filled with mucus.  This can delay other crucial treatments, such as oral steroid medications. If you need to use a bronchodilator inhaler daily, several times per day, you should discuss this with your provider.  There are probably better treatments that could be used to keep your  asthma under control.     HOME CARE . Only take medications as instructed by your medical team. . Complete the entire course of an antibiotic. . Drink plenty of fluids and get plenty of rest. . Avoid close contacts especially the very young and the elderly . Cover your mouth if you cough or cough into your sleeve. . Always remember to wash your hands . A steam or ultrasonic humidifier can help congestion.   GET HELP RIGHT AWAY IF: . You develop worsening fever. . You become short of breath . You cough up blood. . Your symptoms persist after you have completed your treatment plan MAKE SURE YOU   Understand these instructions.  Will watch your condition.  Will get help right away if you are not doing well or get worse.  Your e-visit answers were reviewed by a board certified advanced clinical practitioner to complete your personal care plan.  Depending on the condition, your plan could have included both over the counter or prescription medications. If there is a problem please reply  once you have received a response from your provider. Your safety is important to .  If you have drug allergies check your prescription carefully.    You can use MyChart to ask questions about today's visit, request a non-urgent call back, or ask for a work or school excuse for 24 hours related to this e-Visit. If it has been greater than 24 hours you will need to follow up with your provider, or enter a new e-Visit to address those concerns. You will get  an e-mail in the next two days asking about your experience.  I hope that your e-visit has been valuable and will speed your recovery. Thank you for using e-visits.  Approximately 5 minutes was spent documenting and reviewing patient's chart.

## 2019-11-13 DIAGNOSIS — Z6834 Body mass index (BMI) 34.0-34.9, adult: Secondary | ICD-10-CM | POA: Diagnosis not present

## 2019-11-13 DIAGNOSIS — Z01419 Encounter for gynecological examination (general) (routine) without abnormal findings: Secondary | ICD-10-CM | POA: Diagnosis not present

## 2019-11-15 ENCOUNTER — Encounter: Payer: Self-pay | Admitting: Family Medicine

## 2019-11-18 ENCOUNTER — Other Ambulatory Visit: Payer: Self-pay | Admitting: Family Medicine

## 2019-11-26 ENCOUNTER — Telehealth: Payer: BC Managed Care – PPO | Admitting: Physician Assistant

## 2019-11-26 DIAGNOSIS — J069 Acute upper respiratory infection, unspecified: Secondary | ICD-10-CM

## 2019-11-26 MED ORDER — BENZONATATE 100 MG PO CAPS: 100.0000 mg | ORAL_CAPSULE | Freq: Three times a day (TID) | ORAL | 0 refills | Status: AC

## 2019-11-26 MED ORDER — FLUTICASONE PROPIONATE 50 MCG/ACT NA SUSP: 2.0000 | Freq: Every day | NASAL | 0 refills | Status: DC

## 2019-11-26 NOTE — Progress Notes (Signed)
We are sorry you are not feeling well.  Here is how we plan to help!  Based on what you have shared with me, it looks like you may have a viral upper respiratory infection.  Upper respiratory infections are caused by a large number of viruses; however, rhinovirus is the most common cause. The symptoms of bronchitis can last for several weeks so this may just be a continuation of your bronchitis. The treatment for this is supportive.  Symptoms vary from person to person, with common symptoms including sore throat, cough, fatigue or lack of energy and feeling of general discomfort.  A low-grade fever of up to 100.4 may present, but is often uncommon.  Symptoms vary however, and are closely related to a person's age or underlying illnesses.  The most common symptoms associated with an upper respiratory infection are nasal discharge or congestion, cough, sneezing, headache and pressure in the ears and face.  These symptoms usually persist for about 3 to 10 days, but can last up to 2 weeks.  It is important to know that upper respiratory infections do not cause serious illness or complications in most cases.    Upper respiratory infections can be transmitted from person to person, with the most common method of transmission being a person's hands.  The virus is able to live on the skin and can infect other persons for up to 2 hours after direct contact.  Also, these can be transmitted when someone coughs or sneezes; thus, it is important to cover the mouth to reduce this risk.  To keep the spread of the illness at bay, good hand hygiene is very important.  This is an infection that is most likely caused by a virus. There are no specific treatments other than to help you with the symptoms until the infection runs its course.  We are sorry you are not feeling well.  Here is how we plan to help!   For nasal congestion, you may use an oral decongestants such as Mucinex D or if you have glaucoma or high blood  pressure use plain Mucinex.  Saline nasal spray or nasal drops can help and can safely be used as often as needed for congestion.  For your congestion, I have prescribed Fluticasone nasal spray one spray in each nostril twice a day  If you do not have a history of heart disease, hypertension, diabetes or thyroid disease, prostate/bladder issues or glaucoma, you may also use Sudafed to treat nasal congestion.  It is highly recommended that you consult with a pharmacist or your primary care physician to ensure this medication is safe for you to take.     If you have a cough, you may use cough suppressants such as Delsym and Robitussin.  If you have glaucoma or high blood pressure, you can also use Coricidin HBP.   For cough I have prescribed for you A prescription cough medication called Tessalon Perles 100 mg. You may take 1-2 capsules every 8 hours as needed for cough  If you have a sore or scratchy throat, use a saltwater gargle-  to  teaspoon of salt dissolved in a 4-ounce to 8-ounce glass of warm water.  Gargle the solution for approximately 15-30 seconds and then spit.  It is important not to swallow the solution.  You can also use throat lozenges/cough drops and Chloraseptic spray to help with throat pain or discomfort.  Warm or cold liquids can also be helpful in relieving throat pain.  For headache,  pain or general discomfort, you can use Ibuprofen or Tylenol as directed.   Some authorities believe that zinc sprays or the use of Echinacea may shorten the course of your symptoms.   HOME CARE . Only take medications as instructed by your medical team. . Be sure to drink plenty of fluids. Water is fine as well as fruit juices, sodas and electrolyte beverages. You may want to stay away from caffeine or alcohol. If you are nauseated, try taking small sips of liquids. How do you know if you are getting enough fluid? Your urine should be a pale yellow or almost colorless. . Get rest. . Taking a  steamy shower or using a humidifier may help nasal congestion and ease sore throat pain. You can place a towel over your head and breathe in the steam from hot water coming from a faucet. . Using a saline nasal spray works much the same way. . Cough drops, hard candies and sore throat lozenges may ease your cough. . Avoid close contacts especially the very young and the elderly . Cover your mouth if you cough or sneeze . Always remember to wash your hands.   GET HELP RIGHT AWAY IF: . You develop worsening fever. . If your symptoms do not improve within 10 days . You develop yellow or green discharge from your nose over 3 days. . You have coughing fits . You develop a severe head ache or visual changes. . You develop shortness of breath, difficulty breathing or start having chest pain . Your symptoms persist after you have completed your treatment plan  MAKE SURE YOU   Understand these instructions.  Will watch your condition.  Will get help right away if you are not doing well or get worse.  Your e-visit answers were reviewed by a board certified advanced clinical practitioner to complete your personal care plan. Depending upon the condition, your plan could have included both over the counter or prescription medications. Please review your pharmacy choice. If there is a problem, you may call our nursing hot line at and have the prescription routed to another pharmacy. Your safety is important to [place]. If you have drug allergies check your prescription carefully.   You can use MyChart to ask questions about today's visit, request a non-urgent call back, or ask for a work or school excuse for 24 hours related to this e-Visit. If it has been greater than 24 hours you will need to follow up with your provider, or enter a new e-Visit to address those concerns. You will get an e-mail in the next two days asking about your experience.  I hope that your e-visit has been valuable and will speed  your recovery. Thank you for using e-visits.   Approximately 5 minutes was spent documenting and reviewing patient's chart.

## 2019-12-10 ENCOUNTER — Telehealth: Payer: Self-pay | Admitting: Family Medicine

## 2019-12-10 ENCOUNTER — Other Ambulatory Visit: Payer: Self-pay | Admitting: Family Medicine

## 2019-12-10 ENCOUNTER — Encounter: Payer: Self-pay | Admitting: Family Medicine

## 2019-12-10 DIAGNOSIS — B001 Herpesviral vesicular dermatitis: Secondary | ICD-10-CM

## 2019-12-10 MED ORDER — VALACYCLOVIR HCL 1 G PO TABS: 1000.0000 mg | ORAL_TABLET | Freq: Two times a day (BID) | ORAL | 1 refills | Status: AC | PRN

## 2019-12-10 NOTE — Telephone Encounter (Signed)
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   Pharmacy : [facility]

## 2019-12-11 NOTE — Telephone Encounter (Signed)
Pt has her medication.

## 2019-12-13 ENCOUNTER — Encounter: Payer: Self-pay | Admitting: Family Medicine

## 2019-12-17 ENCOUNTER — Encounter: Payer: Self-pay | Admitting: Family Medicine

## 2019-12-17 ENCOUNTER — Other Ambulatory Visit: Payer: Self-pay

## 2019-12-17 ENCOUNTER — Ambulatory Visit (INDEPENDENT_AMBULATORY_CARE_PROVIDER_SITE_OTHER)
Admission: RE | Admit: 2019-12-17 | Discharge: 2019-12-17 | Disposition: A | Discharge status: Home / Self Care | Payer: BC Managed Care – PPO | Source: Ambulatory Visit | Attending: Family Medicine | Admitting: Family Medicine

## 2019-12-17 ENCOUNTER — Ambulatory Visit: Payer: BC Managed Care – PPO | Admitting: Family Medicine

## 2019-12-17 VITALS — BP 100/70 | HR 87 | Temp 98.0°F | Ht 67.0 in | Wt 224.0 lb

## 2019-12-17 DIAGNOSIS — M25562 Pain in left knee: Secondary | ICD-10-CM

## 2019-12-17 DIAGNOSIS — M222X2 Patellofemoral disorders, left knee: Secondary | ICD-10-CM

## 2019-12-17 MED ORDER — DICLOFENAC SODIUM 75 MG PO TBEC: 75.0000 mg | DELAYED_RELEASE_TABLET | Freq: Two times a day (BID) | ORAL | 3 refills | Status: DC

## 2019-12-17 NOTE — Progress Notes (Signed)
Amey Hossain T. Tyria Springer, MD, CAQ Sports Medicine  Primary Care and Sports Medicine [facility] [address] [place], [address]  Phone: [phone]  FAX: [phone]  Sonia Martinez - 39 y.o. female  MRN [id number]  Date of Birth: 09/09/1980  Date: 12/17/2019  PCP: Emi Belfast, FNP  Referral: Emi Belfast, FNP  Chief Complaint  Patient presents with  . Knee Pain    Left    This visit occurred during the SARS-CoV-2 public health emergency.  Safety protocols were in place, including screening questions prior to the visit, additional usage of staff PPE, and extensive cleaning of exam room while observing appropriate contact time as indicated for disinfecting solutions.   Subjective:   Sonia Martinez is a 39 y.o. very pleasant female patient with Body mass index is 35.08 kg/m. who presents with the following:  L knee pain off and on for 1 year:  18 months she started working out, and she more recently has had difficulties with knee pain.  She was doing some interval training as well as plyometrics, and this seemed to bother her quite a bit more.  She has adapted her workouts now she is doing a lot more pure weightlifting.  She is able to do some more stationary movements without any with much less significant pain.  Only with activity.  When she is not moving she does not really have any pain.  Over the last several weeks she is having more pain with going up and down and going up and down stairs.  She has no significant history of prior knee injury, trauma or operations.  NSAIDS, Tylenol  She had been doing some yoga as well as barre classes, and these do seem to aggravate it.  She has made some modifications, she has been able to do some modified yoga.  Review of Systems is noted in the HPI, as appropriate   Objective:   BP 100/70   Pulse 87   Temp 98 F (36.7 C) (Temporal)   Ht 5\' 7"  (1.702 m)   Wt 224  lb (101.6 kg)   LMP 11/25/2019   SpO2 98%   BMI 35.08 kg/m    Knee: Left Gait: Normal heel toe pattern ROM: 0-1 25 Effusion: neg Echymosis or edema: none Patellar tendon NT Painful PLICA: neg Patellar grind: This causes some pain and grinding Medial and lateral patellar facet loading: She does have pain medial and lateral joint lines:NT Mcmurray's neg Flexion-pinch neg Varus and valgus stress: stable Lachman: neg Ant and Post drawer: neg Hip abduction, IR, ER: WNL Hip flexion str: 5/5 Hip abd: 5/5 Quad: 5/5 VMO atrophy:No Hamstring concentric and eccentric: 5/5   Radiology: DG Knee 4 Views W/Patella Left  Result Date: 12/17/2019 CLINICAL DATA:  Anterior knee pain EXAM: LEFT KNEE - COMPLETE 4+ VIEW COMPARISON:  None FINDINGS: Osseous mineralization normal. Joint spaces preserved. No acute fracture, dislocation, or bone destruction. No joint effusion. Medial and lateral compartments of the RIGHT knee appear preserved. Serpiginous soft tissue opacities are seen at the medial RIGHT leg and at the lateral distal LEFT thigh anteriorly suspect venous varicosities RIGHT greater than LEFT. IMPRESSION: No acute osseous abnormalities. BILATERAL suspected venous varicosities greater on RIGHT. Electronically Signed   By: 12/19/2019 M.D.   On: 12/17/2019 15:31   Assessment and Plan:     ICD-10-CM   1. Patellofemoral syndrome of left knee  M22.2X2 Ambulatory referral to Physical Therapy  2. Acute pain of left knee  M25.562 DG Knee 4 Views W/Patella Left    Ambulatory referral to Physical Therapy   Plain x-rays, reviewed with the patient independently.  She does have some evidence of abnormal lateral tilt on her sunrise view.  Otherwise there is no significant osteoarthritic change, evidence of fracture or dislocation.  I really think the thing would benefit her the most is doing some formal physical therapy.  I did review some home rehab with her.  I think some stability with quads and  hip strengthening would help quite a bit.  I do think that she can ride an exercise or spin bike as hard as she would like.  Otherwise, she can use pain as her guide.  Note reviewed, x-ray independent review, knee pain with exacerbation.  Meds ordered this encounter  Medications  . diclofenac (VOLTAREN) 75 MG EC tablet    Sig: Take 1 tablet (75 mg total) by mouth 2 (two) times daily.    Dispense:  60 tablet    Refill:  3   Medications Discontinued During This Encounter  Medication Reason  . predniSONE (STERAPRED UNI-PAK 21 TAB) 10 MG (21) TBPK tablet Completed Course   Orders Placed This Encounter  Procedures  . DG Knee 4 Views W/Patella Left  . Ambulatory referral to Physical Therapy    Follow-up: No follow-ups on file.  Signed,  Elpidio Galea. Roniya Tetro, MD   Outpatient Encounter Medications as of 12/17/2019  Medication Sig  . albuterol (PROAIR HFA) 108 (90 Base) MCG/ACT inhaler Inhale 2 puffs into the lungs every 4 (four) hours as needed for wheezing or shortness of breath.  . Ascorbic Acid (VITAMIN C) 1000 MG tablet Take 500 mg by mouth daily.   . Bacillus Coagulans-Inulin (PROBIOTIC-PREBIOTIC PO) Take 2 tablets by mouth daily.  . Cholecalciferol (VITAMIN D3) 125 MCG (5000 UT) CAPS Take 1 capsule by mouth daily.  . fluticasone (FLONASE) 50 MCG/ACT nasal spray Place 2 sprays into both nostrils daily.  . Loratadine (CLARITIN PO) Take by mouth.  . Lysine 500 MG CAPS Take 1,000 mg by mouth daily.   . Magnesium 400 MG CAPS Take by mouth.  . montelukast (SINGULAIR) 10 MG tablet TAKE 1 TABLET BY MOUTH ONCE DAILY  . Multiple Vitamin (MULTIVITAMIN) tablet Take 1 tablet by mouth daily.  . Omega-3 Fatty Acids (FISH OIL) 1000 MG CAPS Take 1,000 mg by mouth daily.  Marland Kitchen omeprazole (PRILOSEC) 20 MG capsule Take 1 capsule (20 mg total) by mouth daily.  . Pyridoxine HCl (B-6) 100 MG TABS Take 100 mg by mouth daily.  Marland Kitchen UNABLE TO FIND Med Name: Energy Blend - Vit B12 300mg , COQ-10 30mg , Goj Berry   . UNABLE TO FIND ed Name: Copaiba 120mg   . UNABLE TO FIND Med Name: Zendourine  (supplement)  . UNABLE TO FIND Med Name: (supplement)  . valACYclovir (VALTREX) 1000 MG tablet Take 1 tablet (1,000 mg total) by mouth 2 (two) times daily as needed.  . diclofenac (VOLTAREN) 75 MG EC tablet Take 1 tablet (75 mg total) by mouth 2 (two) times daily.  . [DISCONTINUED] predniSONE (STERAPRED UNI-PAK 21 TAB) 10 MG (21) TBPK tablet Take by mouth daily. Take 6 tabs by mouth daily  for 2 days, then 5 tabs for 2 days, then 4 tabs for 2 days, then 3 tabs for 2 days, 2 tabs for 2 days, then 1 tab by mouth daily for 2 days   No facility-administered encounter medications on file as of  12/17/2019.    

## 2019-12-24 DIAGNOSIS — M9901 Segmental and somatic dysfunction of cervical region: Secondary | ICD-10-CM | POA: Diagnosis not present

## 2019-12-24 DIAGNOSIS — M531 Cervicobrachial syndrome: Secondary | ICD-10-CM | POA: Diagnosis not present

## 2019-12-24 DIAGNOSIS — M530 Cervicocranial syndrome: Secondary | ICD-10-CM | POA: Diagnosis not present

## 2019-12-24 DIAGNOSIS — M9902 Segmental and somatic dysfunction of thoracic region: Secondary | ICD-10-CM | POA: Diagnosis not present

## 2019-12-30 DIAGNOSIS — M2242 Chondromalacia patellae, left knee: Secondary | ICD-10-CM | POA: Diagnosis not present

## 2020-01-28 DIAGNOSIS — M531 Cervicobrachial syndrome: Secondary | ICD-10-CM | POA: Diagnosis not present

## 2020-01-28 DIAGNOSIS — M9901 Segmental and somatic dysfunction of cervical region: Secondary | ICD-10-CM | POA: Diagnosis not present

## 2020-01-28 DIAGNOSIS — M530 Cervicocranial syndrome: Secondary | ICD-10-CM | POA: Diagnosis not present

## 2020-01-28 DIAGNOSIS — M9902 Segmental and somatic dysfunction of thoracic region: Secondary | ICD-10-CM | POA: Diagnosis not present

## 2020-02-13 DIAGNOSIS — I8312 Varicose veins of left lower extremity with inflammation: Secondary | ICD-10-CM | POA: Diagnosis not present

## 2020-02-13 DIAGNOSIS — I8311 Varicose veins of right lower extremity with inflammation: Secondary | ICD-10-CM | POA: Diagnosis not present

## 2020-02-13 DIAGNOSIS — I83813 Varicose veins of bilateral lower extremities with pain: Secondary | ICD-10-CM | POA: Diagnosis not present

## 2020-03-03 DIAGNOSIS — M530 Cervicocranial syndrome: Secondary | ICD-10-CM | POA: Diagnosis not present

## 2020-03-03 DIAGNOSIS — M531 Cervicobrachial syndrome: Secondary | ICD-10-CM | POA: Diagnosis not present

## 2020-03-03 DIAGNOSIS — M9901 Segmental and somatic dysfunction of cervical region: Secondary | ICD-10-CM | POA: Diagnosis not present

## 2020-03-03 DIAGNOSIS — M9902 Segmental and somatic dysfunction of thoracic region: Secondary | ICD-10-CM | POA: Diagnosis not present

## 2020-03-10 DIAGNOSIS — I83813 Varicose veins of bilateral lower extremities with pain: Secondary | ICD-10-CM | POA: Diagnosis not present

## 2020-03-10 DIAGNOSIS — I8312 Varicose veins of left lower extremity with inflammation: Secondary | ICD-10-CM | POA: Diagnosis not present

## 2020-03-10 DIAGNOSIS — I8311 Varicose veins of right lower extremity with inflammation: Secondary | ICD-10-CM | POA: Diagnosis not present

## 2020-03-16 ENCOUNTER — Telehealth: Payer: BC Managed Care – PPO | Admitting: Nurse Practitioner

## 2020-03-16 DIAGNOSIS — J0100 Acute maxillary sinusitis, unspecified: Secondary | ICD-10-CM | POA: Diagnosis not present

## 2020-03-16 MED ORDER — AMOXICILLIN-POT CLAVULANATE 875-125 MG PO TABS: 1.0000 | ORAL_TABLET | Freq: Two times a day (BID) | ORAL | 0 refills | Status: DC

## 2020-03-16 NOTE — Progress Notes (Signed)

## 2020-03-19 DIAGNOSIS — I8312 Varicose veins of left lower extremity with inflammation: Secondary | ICD-10-CM | POA: Diagnosis not present

## 2020-03-19 DIAGNOSIS — I83813 Varicose veins of bilateral lower extremities with pain: Secondary | ICD-10-CM | POA: Diagnosis not present

## 2020-03-19 DIAGNOSIS — I8311 Varicose veins of right lower extremity with inflammation: Secondary | ICD-10-CM | POA: Diagnosis not present

## 2020-04-07 DIAGNOSIS — M9901 Segmental and somatic dysfunction of cervical region: Secondary | ICD-10-CM | POA: Diagnosis not present

## 2020-04-07 DIAGNOSIS — M9902 Segmental and somatic dysfunction of thoracic region: Secondary | ICD-10-CM | POA: Diagnosis not present

## 2020-04-07 DIAGNOSIS — M531 Cervicobrachial syndrome: Secondary | ICD-10-CM | POA: Diagnosis not present

## 2020-04-07 DIAGNOSIS — M530 Cervicocranial syndrome: Secondary | ICD-10-CM | POA: Diagnosis not present

## 2020-05-05 DIAGNOSIS — M530 Cervicocranial syndrome: Secondary | ICD-10-CM | POA: Diagnosis not present

## 2020-05-05 DIAGNOSIS — M9902 Segmental and somatic dysfunction of thoracic region: Secondary | ICD-10-CM | POA: Diagnosis not present

## 2020-05-05 DIAGNOSIS — M9901 Segmental and somatic dysfunction of cervical region: Secondary | ICD-10-CM | POA: Diagnosis not present

## 2020-05-05 DIAGNOSIS — M531 Cervicobrachial syndrome: Secondary | ICD-10-CM | POA: Diagnosis not present

## 2020-05-20 ENCOUNTER — Other Ambulatory Visit: Payer: Self-pay

## 2020-05-20 DIAGNOSIS — I8311 Varicose veins of right lower extremity with inflammation: Secondary | ICD-10-CM | POA: Diagnosis not present

## 2020-05-20 DIAGNOSIS — I83813 Varicose veins of bilateral lower extremities with pain: Secondary | ICD-10-CM | POA: Diagnosis not present

## 2020-05-20 DIAGNOSIS — I8312 Varicose veins of left lower extremity with inflammation: Secondary | ICD-10-CM | POA: Diagnosis not present

## 2020-05-20 NOTE — Telephone Encounter (Signed)
Pharmacy requests refill on: Fluticasone Propionate 50 mcg/act Nasal Spray   LAST REFILL: 11/26/2019 (Q-16 g, R-0) LAST OV: 11/08/2019 NEXT OV: Not Scheduled  PHARMACY: [facility]

## 2020-05-21 MED ORDER — FLUTICASONE PROPIONATE 50 MCG/ACT NA SUSP: 2.0000 | Freq: Every day | NASAL | 0 refills | Status: DC

## 2020-06-02 DIAGNOSIS — M9902 Segmental and somatic dysfunction of thoracic region: Secondary | ICD-10-CM | POA: Diagnosis not present

## 2020-06-02 DIAGNOSIS — M9901 Segmental and somatic dysfunction of cervical region: Secondary | ICD-10-CM | POA: Diagnosis not present

## 2020-06-02 DIAGNOSIS — M530 Cervicocranial syndrome: Secondary | ICD-10-CM | POA: Diagnosis not present

## 2020-06-02 DIAGNOSIS — M531 Cervicobrachial syndrome: Secondary | ICD-10-CM | POA: Diagnosis not present

## 2020-06-18 ENCOUNTER — Ambulatory Visit (INDEPENDENT_AMBULATORY_CARE_PROVIDER_SITE_OTHER): Payer: BC Managed Care – PPO | Admitting: Nurse Practitioner

## 2020-06-18 ENCOUNTER — Encounter (INDEPENDENT_AMBULATORY_CARE_PROVIDER_SITE_OTHER): Payer: Self-pay | Admitting: Nurse Practitioner

## 2020-06-18 ENCOUNTER — Other Ambulatory Visit: Payer: Self-pay

## 2020-06-18 VITALS — BP 107/72 | HR 88 | Ht 67.0 in | Wt 223.0 lb

## 2020-06-18 DIAGNOSIS — I8311 Varicose veins of right lower extremity with inflammation: Secondary | ICD-10-CM | POA: Diagnosis not present

## 2020-06-18 DIAGNOSIS — R1011 Right upper quadrant pain: Secondary | ICD-10-CM | POA: Insufficient documentation

## 2020-06-18 DIAGNOSIS — R143 Flatulence: Secondary | ICD-10-CM | POA: Insufficient documentation

## 2020-06-18 DIAGNOSIS — R141 Gas pain: Secondary | ICD-10-CM | POA: Insufficient documentation

## 2020-06-18 DIAGNOSIS — I8312 Varicose veins of left lower extremity with inflammation: Secondary | ICD-10-CM

## 2020-06-18 DIAGNOSIS — R7401 Elevation of levels of liver transaminase levels: Secondary | ICD-10-CM | POA: Insufficient documentation

## 2020-06-18 DIAGNOSIS — K8020 Calculus of gallbladder without cholecystitis without obstruction: Secondary | ICD-10-CM | POA: Insufficient documentation

## 2020-06-18 DIAGNOSIS — K581 Irritable bowel syndrome with constipation: Secondary | ICD-10-CM | POA: Insufficient documentation

## 2020-06-22 ENCOUNTER — Encounter (INDEPENDENT_AMBULATORY_CARE_PROVIDER_SITE_OTHER): Payer: Self-pay | Admitting: Nurse Practitioner

## 2020-06-22 NOTE — Progress Notes (Signed)
Subjective:    Patient ID: Sonia Martinez, female    DOB: [birth year], 40 y.o.   MRN: [id number] Chief Complaint  Patient presents with  . New Patient (Initial Visit)    New patient has studies in NP  paper work    Sonia Martinez is a 40 year old female that is seen for evaluation of symptomatic varicose veins.  The patient previously had work-up and ultrasounds done at an outside facility.  Unfortunately, her insurance was out of network at this facility.  The patient relates burning and stinging which worsened steadily throughout the course of the day, particularly with standing. The patient also notes an aching and throbbing pain over the varicosities, particularly with prolonged dependent positions. The symptoms are significantly improved with elevation.  The patient also notes that during hot weather the symptoms are greatly intensified. The patient states the pain from the varicose veins interferes with work, daily exercise, shopping and household maintenance. At this point, the symptoms are persistent and severe enough that they're having a negative impact on lifestyle and are interfering with daily activities.  There is no history of DVT, PE or superficial thrombophlebitis. There is no history of ulceration or hemorrhage. The patient denies a significant family history of varicose veins. OB history: [id number]  The patient has completed 3 months of conservative therapy including use of medical grade compression stockings worn daily. At the present time the patient has  been using over-the-counter analgesics. There is no history of prior surgical intervention or sclerotherapy.  Noninvasive studies show reflux in the great saphenous vein of the right lower extremity beginning in the proximal thigh extending to the distal calf.  There is also reflux in the small saphenous vein.  The left lower extremity has reflux in the saphenofemoral junction as well as at the knee.  There is also  reflux in the left small saphenous vein.  There is no evidence of deep venous insufficiency.  No evidence of DVT or superficial thrombophlebitis.   Review of Systems  Cardiovascular: Positive for leg swelling.  Hematological: Bruises/bleeds easily.  All other systems reviewed and are negative.      Objective:   Physical Exam Vitals reviewed.  HENT:     Head: Normocephalic.  Cardiovascular:     Rate and Rhythm: Normal rate.     Pulses: Normal pulses.  Pulmonary:     Effort: Pulmonary effort is normal.  Neurological:     Mental Status: She is alert and oriented to person, place, and time.  Psychiatric:        Mood and Affect: Mood normal.        Behavior: Behavior normal.        Thought Content: Thought content normal.        Judgment: Judgment normal.     BP 107/72   Pulse 88   Ht 5\' 7"  (1.702 m)   Wt 223 lb (101.2 kg)   BMI 34.93 kg/m   Past Medical History:  Diagnosis Date  . Asthma    WELL CONTROLLED  . Fatty liver   . Folliculitis 03/31/2016  . GERD (gastroesophageal reflux disease)   . Herpes simplex virus (HSV) infection 03/23/2009   Overview:  Herpes Simplex  10/1 IMO update  . History of methicillin resistant staphylococcus aureus (MRSA) 2013   + PCR SCREEN  . IBS (irritable bowel syndrome)   . Migraine without aura, without mention of intractable migraine without mention of status migrainosus 06/05/2013  . Obesity   .  Oligohydramnios    2013  . Pre-eclampsia    2013  . Preeclampsia 01/05/2016  . Pregnancy induced hypertension   . Wrist joint pain 01/09/2017    Social History   Socioeconomic History  . Marital status: Married    Spouse name: Not on file  . Number of children: 1  . Years of education: MA  . Highest education level: Not on file  Occupational History  . Occupation: Estate manager/land agent    Comment: [employer]  Tobacco Use  . Smoking status: Never Smoker  . Smokeless tobacco: Never Used  Vaping Use  . Vaping Use: Never used   Substance and Sexual Activity  . Alcohol use: No  . Drug use: No  . Sexual activity: Yes  Other Topics Concern  . 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   Husband is Emergency planning/management officer.   She enjoys cooking and reading.    No regular exercise.    Patient is right handed.   Patient has a Master's degree.   Patient drinks 2 cups daily.   Social Determinants of Health   Financial Resource Strain: Not on file  Food Insecurity: Not on file  Transportation Needs: Not on file  Physical Activity: Not on file  Stress: Not on file  Social Connections: Not on file  Intimate Partner Violence: Not on file    Past Surgical History:  Procedure Laterality Date  . CHOLECYSTECTOMY N/A 02/24/2017   Procedure: LAPAROSCOPIC CHOLECYSTECTOMY;  Surgeon: Leafy Ro, MD;  Location: [facility] ORS;  Service: General;  Laterality: N/A;  . LIVER BIOPSY N/A 02/24/2017   Procedure: LIVER BIOPSY;  Surgeon: Leafy Ro, MD;  Location: [facility] ORS;  Service: General;  Laterality: N/A;  . SEPTOPLASTY    . WISDOM TOOTH EXTRACTION      Family History  Problem Relation Age of Onset  . Hypertension Mother   . Cancer - Other Mother        uterine  . Arthritis Mother   . Heart disease Maternal Grandfather   . Hyperlipidemia Maternal Grandfather   . Hypertension Maternal Grandfather   . Diabetes Paternal Grandmother   . Hyperlipidemia Paternal Grandmother   . Stroke Paternal Grandmother   . Hypertension Paternal Grandmother   . Lung cancer Paternal Grandfather   . Diabetes Paternal Grandfather   . Cancer - Other Paternal Grandfather        lung  . Hyperlipidemia Paternal Grandfather   . Hypertension Paternal Grandfather   . Hypertension Father   . Arthritis Maternal Grandmother   . Hyperlipidemia Maternal Grandmother   . Stroke Maternal Grandmother   . Hypertension Maternal Grandmother    . Anesthesia problems Neg Hx   . Malignant hyperthermia Neg Hx   . Pseudochol deficiency Neg Hx   . Hypotension Neg Hx   . Migraines Neg Hx     Allergies  Allergen Reactions  . Coconut Oil Hives    ANYTHING WITH COCONUT IN IT  . Dulera [Mometasone Furo-Formoterol Fum]     Patient can only take advair  . Ciprofloxacin Hives and Rash  . Hydrocortisone Hives and Rash    CBC Latest Ref Rng & Units 11/08/2019 09/10/2018 03/03/2017  WBC 4.0 - 10.5 K/uL 6.2 6.4 7.1  Hemoglobin 12.0 - 15.0 g/dL 71.6 96.7 89.3  Hematocrit 36.0 - 46.0 % 40.1 40.1 40.1  Platelets  150.0 - 400.0 K/uL 203.0 214.0 220      CMP     Component Value Date/Time   NA 139 11/08/2019 0936   K 4.5 11/08/2019 0936   CL 105 11/08/2019 0936   CO2 29 11/08/2019 0936   GLUCOSE 90 11/08/2019 0936   BUN 13 11/08/2019 0936   CREATININE 0.77 11/08/2019 0936   CALCIUM 8.9 11/08/2019 0936   PROT 6.5 11/08/2019 0936   ALBUMIN 4.0 11/08/2019 0936   AST 24 11/08/2019 0936   ALT 30 11/08/2019 0936   ALKPHOS 62 11/08/2019 0936   BILITOT 0.8 11/08/2019 0936   GFRNONAA >60 03/03/2017 0851   GFRAA >60 03/03/2017 0851     No results found.     Assessment & Plan:   1. Varicose veins of both lower extremities with inflammation Recommend  I have reviewed my previous  discussion with the patient regarding  varicose veins and why they cause symptoms. Patient will continue  wearing graduated compression stockings class 1 on a daily basis, beginning first thing in the morning and removing them in the evening.    In addition, behavioral modification including elevation during the day was again discussed and this will continue.  The patient has utilized over the counter pain medications and has been exercising.  However, at this time conservative therapy has not alleviated the patient's symptoms of leg pain and swelling  Recommend: laser ablation of the right and  left great saphenous veins to eliminate the symptoms of  pain and swelling of the lower extremities caused by the severe superficial venous reflux disease.    Current Outpatient Medications on File Prior to Visit  Medication Sig Dispense Refill  . albuterol (PROAIR HFA) 108 (90 Base) MCG/ACT inhaler Inhale 2 puffs into the lungs every 4 (four) hours as needed for wheezing or shortness of breath. 8 g 2  . amoxicillin-clavulanate (AUGMENTIN) 875-125 MG tablet Take 1 tablet by mouth 2 (two) times daily. 14 tablet 0  . Ascorbic Acid (VITAMIN C) 1000 MG tablet Take 500 mg by mouth daily.     . Bacillus Coagulans-Inulin (PROBIOTIC-PREBIOTIC PO) Take 2 tablets by mouth daily.    . Cholecalciferol (VITAMIN D3) 125 MCG (5000 UT) CAPS Take 1 capsule by mouth daily.    . diclofenac (VOLTAREN) 75 MG EC tablet Take 1 tablet (75 mg total) by mouth 2 (two) times daily. 60 tablet 3  . fluticasone (FLONASE) 50 MCG/ACT nasal spray Place 2 sprays into both nostrils daily. 16 g 0  . Loratadine (CLARITIN PO) Take by mouth.    . Lysine 500 MG CAPS Take 1,000 mg by mouth daily.     . Magnesium 400 MG CAPS Take by mouth.    . montelukast (SINGULAIR) 10 MG tablet TAKE 1 TABLET BY MOUTH ONCE DAILY 90 tablet 3  . Multiple Vitamin (MULTIVITAMIN) tablet Take 1 tablet by mouth daily.    . Omega-3 Fatty Acids (FISH OIL) 1000 MG CAPS Take 1,000 mg by mouth daily.    Marland Kitchen omeprazole (PRILOSEC) 20 MG capsule Take 1 capsule (20 mg total) by mouth daily. 90 capsule 3  . Pyridoxine HCl (B-6) 100 MG TABS Take 100 mg by mouth daily.    Marland Kitchen UNABLE TO FIND Med Name: Energy Blend - Vit B12 300mg , COQ-10 30mg , Goj Berry    . UNABLE TO FIND ed Name: Copaiba 120mg     . UNABLE TO FIND Med Name: Zendourine  (supplement)    . UNABLE TO FIND Med Name: (  supplement)    . valACYclovir (VALTREX) 1000 MG tablet Take 1 tablet (1,000 mg total) by mouth 2 (two) times daily as needed. 10 tablet 1   No current facility-administered medications on file prior to visit.    There are no Patient  Instructions on file for this visit. No follow-ups on file.   Georgiana SpinnerFallon E Ludwika Rodd, NP

## 2020-06-30 DIAGNOSIS — M530 Cervicocranial syndrome: Secondary | ICD-10-CM | POA: Diagnosis not present

## 2020-06-30 DIAGNOSIS — M9901 Segmental and somatic dysfunction of cervical region: Secondary | ICD-10-CM | POA: Diagnosis not present

## 2020-06-30 DIAGNOSIS — M9902 Segmental and somatic dysfunction of thoracic region: Secondary | ICD-10-CM | POA: Diagnosis not present

## 2020-06-30 DIAGNOSIS — M531 Cervicobrachial syndrome: Secondary | ICD-10-CM | POA: Diagnosis not present

## 2020-07-06 ENCOUNTER — Other Ambulatory Visit: Payer: Self-pay | Admitting: Family

## 2020-07-09 ENCOUNTER — Telehealth: Payer: BC Managed Care – PPO | Admitting: Physician Assistant

## 2020-07-09 DIAGNOSIS — J019 Acute sinusitis, unspecified: Secondary | ICD-10-CM | POA: Diagnosis not present

## 2020-07-09 DIAGNOSIS — B9689 Other specified bacterial agents as the cause of diseases classified elsewhere: Secondary | ICD-10-CM

## 2020-07-09 MED ORDER — AMOXICILLIN-POT CLAVULANATE 875-125 MG PO TABS: 1.0000 | ORAL_TABLET | Freq: Two times a day (BID) | ORAL | 0 refills | Status: DC

## 2020-07-09 NOTE — Progress Notes (Signed)

## 2020-07-09 NOTE — Progress Notes (Signed)
I have spent 5 minutes in review of e-visit questionnaire, review and updating patient chart, medical decision making and response to patient.   Lavra Imler Cody Alexine Pilant, PA-C    

## 2020-07-14 ENCOUNTER — Telehealth (INDEPENDENT_AMBULATORY_CARE_PROVIDER_SITE_OTHER): Payer: Self-pay | Admitting: Nurse Practitioner

## 2020-07-15 NOTE — Telephone Encounter (Signed)
Documentation only.

## 2020-07-19 ENCOUNTER — Emergency Department
Admission: EM | Admit: 2020-07-19 | Discharge: 2020-07-19 | Disposition: A | Discharge status: Home / Self Care | Payer: BC Managed Care – PPO | Attending: Student in an Organized Health Care Education/Training Program | Admitting: Student in an Organized Health Care Education/Training Program

## 2020-07-19 ENCOUNTER — Other Ambulatory Visit: Payer: Self-pay

## 2020-07-19 DIAGNOSIS — S6992XA Unspecified injury of left wrist, hand and finger(s), initial encounter: Secondary | ICD-10-CM | POA: Diagnosis not present

## 2020-07-19 DIAGNOSIS — S61412A Laceration without foreign body of left hand, initial encounter: Secondary | ICD-10-CM | POA: Diagnosis not present

## 2020-07-19 DIAGNOSIS — J45909 Unspecified asthma, uncomplicated: Secondary | ICD-10-CM | POA: Insufficient documentation

## 2020-07-19 DIAGNOSIS — Z23 Encounter for immunization: Secondary | ICD-10-CM | POA: Insufficient documentation

## 2020-07-19 DIAGNOSIS — Z7951 Long term (current) use of inhaled steroids: Secondary | ICD-10-CM | POA: Diagnosis not present

## 2020-07-19 DIAGNOSIS — W260XXA Contact with knife, initial encounter: Secondary | ICD-10-CM | POA: Insufficient documentation

## 2020-07-19 MED ORDER — CEPHALEXIN 500 MG PO CAPS: 500.0000 mg | ORAL_CAPSULE | Freq: Three times a day (TID) | ORAL | 0 refills | Status: AC

## 2020-07-19 MED ORDER — TETANUS-DIPHTH-ACELL PERTUSSIS 5-2.5-18.5 LF-MCG/0.5 IM SUSY
0.5000 mL | PREFILLED_SYRINGE | Freq: Once | INTRAMUSCULAR | Status: AC
  Administered 2020-07-19: 0.5 mL via INTRAMUSCULAR
  Filled 2020-07-19: qty 0.5

## 2020-07-19 MED ORDER — FLUCONAZOLE 150 MG PO TABS: 150.0000 mg | ORAL_TABLET | Freq: Every day | ORAL | 0 refills | Status: DC

## 2020-07-19 NOTE — Discharge Instructions (Addendum)
Take Keflex 3 times a day for the next 7 days. Diflucan has been prescribed.

## 2020-07-19 NOTE — ED Triage Notes (Signed)
Pt states was slicing and avacado this pm when she lacerated between her index and middle finger of left hand. Bleeding controlled.

## 2020-07-19 NOTE — ED Provider Notes (Signed)
[facility]-EMERGENCY DEPARTMENT  ____________________________________________  Time seen: Approximately 8:59 PM  I have reviewed the triage vital signs and the nursing notes.   HISTORY  Chief Complaint finger laceration   Historian Patient     HPI Sonia Martinez is a 40 y.o. female presents to the emergency department with a left hand laceration.  Patient has 1 and half centimeter laceration that is well approximated between left second and third digits.  Patient accidentally sustained laceration with a knife in avocado.  No numbness or tingling of the left hand.  Patient cannot recall her last tetanus shot.   Past Medical History:  Diagnosis Date   Asthma    WELL CONTROLLED   Fatty liver    Folliculitis 03/31/2016   GERD (gastroesophageal reflux disease)    Herpes simplex virus (HSV) infection 03/23/2009   Overview:  Herpes Simplex  10/1 IMO update   History of methicillin resistant staphylococcus aureus (MRSA) 2013   + PCR SCREEN   IBS (irritable bowel syndrome)    Migraine without aura, without mention of intractable migraine without mention of status migrainosus 06/05/2013   Obesity    Oligohydramnios    2013   Pre-eclampsia    2013   Preeclampsia 01/05/2016   Pregnancy induced hypertension    Wrist joint pain 01/09/2017     Immunizations up to date:  Yes.     Past Medical History:  Diagnosis Date   Asthma    WELL CONTROLLED   Fatty liver    Folliculitis 03/31/2016   GERD (gastroesophageal reflux disease)    Herpes simplex virus (HSV) infection 03/23/2009   Overview:  Herpes Simplex  10/1 IMO update   History of methicillin resistant staphylococcus aureus (MRSA) 2013   + PCR SCREEN   IBS (irritable bowel syndrome)    Migraine without aura, without mention of intractable migraine without mention of status migrainosus 06/05/2013   Obesity    Oligohydramnios    2013   Pre-eclampsia    2013   Preeclampsia 01/05/2016   Pregnancy induced hypertension     Wrist joint pain 01/09/2017    Patient Active Problem List   Diagnosis Date Noted   Cholelithiasis without obstruction 06/18/2020   Irritable bowel syndrome with constipation 06/18/2020   Elevated levels of transaminase & lactic acid dehydrogenase 06/18/2020   Flatulence, eructation and gas pain 06/18/2020   Right upper quadrant pain 06/18/2020   Gastroesophageal reflux disease 05/20/2019   Fatty liver    Carpal tunnel syndrome 01/09/2017   Wrist joint pain 01/09/2017   Folliculitis 03/31/2016   Buttock pain 02/16/2016   NSVD (normal spontaneous vaginal delivery) 01/06/2016   Preeclampsia 01/05/2016   Allergic rhinitis 09/22/2015   Abnormal MSAFP (maternal serum alpha-fetoprotein), elevated 09/01/2015   Migraine without aura, without mention of intractable migraine without mention of status migrainosus 06/05/2013   Deviated nasal septum 02/18/2013   Asthma 02/22/2010   Vitamin D deficiency 11/04/2009   Headache 07/16/2009   Abnormal weight gain 07/02/2009   Exercise induced bronchospasm 04/01/2009   Obesity, unspecified 04/01/2009   Herpes simplex virus (HSV) infection 03/23/2009    Past Surgical History:  Procedure Laterality Date   CHOLECYSTECTOMY N/A 02/24/2017   Procedure: LAPAROSCOPIC CHOLECYSTECTOMY;  Surgeon: Leafy Ro, MD;  Location: [facility] ORS;  Service: General;  Laterality: N/A;   LIVER BIOPSY N/A 02/24/2017   Procedure: LIVER BIOPSY;  Surgeon: Leafy Ro, MD;  Location: [facility] ORS;  Service: General;  Laterality: N/A;   SEPTOPLASTY  WISDOM TOOTH EXTRACTION      Prior to Admission medications   Medication Sig Start Date End Date Taking? Authorizing Provider  cephALEXin (KEFLEX) 500 MG capsule Take 1 capsule (500 mg total) by mouth 3 (three) times daily for 7 days. 07/19/20 07/26/20 Yes Pia Mau M, PA-C  fluconazole (DIFLUCAN) 150 MG tablet Take 1 tablet (150 mg total) by mouth daily. 07/19/20  Yes Pia Mau M, PA-C  albuterol (PROAIR HFA) 108 (90 Base)  MCG/ACT inhaler Inhale 2 puffs into the lungs every 4 (four) hours as needed for wheezing or shortness of breath. 08/09/18   Withrow, Everardo All, FNP  amoxicillin-clavulanate (AUGMENTIN) 875-125 MG tablet Take 1 tablet by mouth 2 (two) times daily. 07/09/20   Waldon Merl, PA-C  Ascorbic Acid (VITAMIN C) 1000 MG tablet Take 500 mg by mouth daily.     [provider]  Bacillus Coagulans-Inulin (PROBIOTIC-PREBIOTIC PO) Take 2 tablets by mouth daily.    [provider]  Cholecalciferol (VITAMIN D3) 125 MCG (5000 UT) CAPS Take 1 capsule by mouth daily.    [provider]  diclofenac (VOLTAREN) 75 MG EC tablet Take 1 tablet (75 mg total) by mouth 2 (two) times daily. 12/17/19 12/16/20  Copland, Karleen Hampshire, MD  fluticasone (FLONASE) 50 MCG/ACT nasal spray Place 2 sprays into both nostrils daily. 05/21/20   Worthy Rancher B, FNP  Loratadine (CLARITIN PO) Take by mouth.    [provider]  Lysine 500 MG CAPS Take 1,000 mg by mouth daily.     [provider]  Magnesium 400 MG CAPS Take by mouth.    [provider]  montelukast (SINGULAIR) 10 MG tablet TAKE 1 TABLET BY MOUTH ONCE DAILY 10/16/19   Emi Belfast, FNP  Multiple Vitamin (MULTIVITAMIN) tablet Take 1 tablet by mouth daily.    [provider]  Omega-3 Fatty Acids (FISH OIL) 1000 MG CAPS Take 1,000 mg by mouth daily.    [provider]  omeprazole (PRILOSEC) 20 MG capsule Take 1 capsule (20 mg total) by mouth daily. 11/08/19   Emi Belfast, FNP  Pyridoxine HCl (B-6) 100 MG TABS Take 100 mg by mouth daily.    [provider]  UNABLE TO FIND Med Name: Energy Blend - Vit B12 300mg , COQ-10 30mg , Goj    [provider]  UNABLE TO FIND ed Name: Copaiba 120mg     [provider]  UNABLE TO FIND Med Name: Zendourine  (supplement)    [provider]  UNABLE TO FIND Med Name: (supplement)    [provider]  valACYclovir  (VALTREX) 1000 MG tablet Take 1 tablet (1,000 mg total) by mouth 2 (two) times daily as needed. 12/10/19   , FNP    Allergies Coconut oil, Dulera [mometasone furo-formoterol fum], Ciprofloxacin, and Hydrocortisone  Family History  Problem Relation Age of Onset   Hypertension Mother    Cancer - Other Mother        uterine   Arthritis Mother    Heart disease Maternal Grandfather    Hyperlipidemia Maternal Grandfather    Hypertension Maternal Grandfather    Diabetes Paternal Grandmother    Hyperlipidemia Paternal Grandmother    Stroke Paternal Grandmother    Hypertension Paternal Grandmother    Lung cancer Paternal Grandfather    Diabetes Paternal Grandfather    Cancer - Other Paternal Grandfather        lung   Hyperlipidemia Paternal Grandfather    Hypertension Paternal Durenda Hurt  Hypertension Father    Arthritis Maternal Grandmother    Hyperlipidemia Maternal Grandmother    Stroke Maternal Grandmother    Hypertension Maternal Grandmother    Anesthesia problems Neg Hx    Malignant hyperthermia Neg Hx    Pseudochol deficiency Neg Hx    Hypotension Neg Hx    Migraines Neg Hx     Social History Social History   Tobacco Use   Smoking status: Never   Smokeless tobacco: Never  Vaping Use   Vaping Use: Never used  Substance Use Topics   Alcohol use: No   Drug use: No     Review of Systems  Constitutional: No fever/chills Eyes:  No discharge ENT: No upper respiratory complaints. Respiratory: no cough. No SOB/ use of accessory muscles to breath Gastrointestinal:   No nausea, no vomiting.  No diarrhea.  No constipation. Musculoskeletal: Negative for musculoskeletal pain. Skin: Patient has laceration.     ____________________________________________   PHYSICAL EXAM:  VITAL SIGNS: ED Triage Vitals  Enc Vitals Group     BP 07/19/20 1928 136/79     Pulse Rate 07/19/20 1928 76     Resp 07/19/20 1928 16     Temp 07/19/20 1928 98.5 F (36.9  C)     Temp Source 07/19/20 1928 Oral     SpO2 07/19/20 1928 100 %     Weight 07/19/20 1929 217 lb (98.4 kg)     Height 07/19/20 1929 5\' 7"  (1.702 m)     Head Circumference --      Peak Flow --      Pain Score 07/19/20 1929 1     Pain Loc --      Pain Edu? --      Excl. in GC? --      Constitutional: Alert and oriented. Well appearing and in no acute distress. Eyes: Conjunctivae are normal. PERRL. EOMI. Head: Atraumatic. ENT:  Cardiovascular: Normal rate, regular rhythm. Normal S1 and S2.  Good peripheral circulation. Respiratory: Normal respiratory effort without tachypnea or retractions. Lungs CTAB. Good air entry to the bases with no decreased or absent breath sounds Gastrointestinal: Bowel sounds x 4 quadrants. Soft and nontender to palpation. No guarding or rigidity. No distention. Musculoskeletal: Full range of motion to all extremities. No obvious deformities noted Neurologic:  Normal for age. No gross focal neurologic deficits are appreciated.  Skin: Patient has 1 and half centimeter laceration between left index and middle finger.  Laceration is well approximated. Psychiatric: Mood and affect are normal for age. Speech and behavior are normal.   ____________________________________________   LABS (all labs ordered are listed, but only abnormal results are displayed)  Labs Reviewed - No data to display ____________________________________________  EKG   ____________________________________________  RADIOLOGY   No results found.  ____________________________________________    PROCEDURES  Procedure(s) performed:     06/28/22Marland KitchenLaceration Repair  Date/Time: 07/19/2020 9:01 PM Performed by: 07/21/2020, PA-C Authorized by: Orvil Feil, PA-C   Consent:    Consent obtained:  Verbal   Risks discussed:  Infection and pain Universal protocol:    Procedure explained and questions answered to patient or proxy's satisfaction: yes     Patient identity  confirmed:  Verbally with patient Anesthesia:    Anesthesia method:  None Laceration details:    Length (cm):  1.5   Depth (mm):  5 Pre-procedure details:    Preparation:  Patient was prepped and draped in usual sterile fashion Exploration:    Limited defect created (  wound extended): no   Treatment:    Area cleansed with:  Povidone-iodine   Irrigation volume:  500   Debridement:  None Skin repair:    Repair method:  Tissue adhesive Approximation:    Approximation:  Close Repair type:    Repair type:  Simple     Medications  Tdap (BOOSTRIX) injection 0.5 mL (has no administration in time range)     ____________________________________________   INITIAL IMPRESSION / ASSESSMENT AND PLAN / ED COURSE  Pertinent labs & imaging results that were available during my care of the patient were reviewed by me and considered in my medical decision making (see chart for details).       Assessment and plan Laceration 40 year old female presents to the emergency department with a laceration between the left second and third digits repaired in the emergency department without complication.  Patient's tetanus status was updated.  Patient was discharged with Keflex.  Return precautions were given to return with new or worsening symptoms.    ____________________________________________  FINAL CLINICAL IMPRESSION(S) / ED DIAGNOSES  Final diagnoses:  Laceration of left hand without foreign body, initial encounter      NEW MEDICATIONS STARTED DURING THIS VISIT:  ED Discharge Orders          Ordered    cephALEXin (KEFLEX) 500 MG capsule  3 times daily        07/19/20 2056    fluconazole (DIFLUCAN) 150 MG tablet  Daily        07/19/20 2056                This chart was dictated using voice recognition software/Dragon. Despite best efforts to proofread, errors can occur which can change the meaning. Any change was purely unintentional.     Gasper LloydWoods, Rhonna Holster M,  PA-C 07/19/20 2103    Willy Eddyobinson, Patrick, MD 07/19/20 2306

## 2020-07-21 DIAGNOSIS — M531 Cervicobrachial syndrome: Secondary | ICD-10-CM | POA: Diagnosis not present

## 2020-07-21 DIAGNOSIS — M9901 Segmental and somatic dysfunction of cervical region: Secondary | ICD-10-CM | POA: Diagnosis not present

## 2020-07-21 DIAGNOSIS — M530 Cervicocranial syndrome: Secondary | ICD-10-CM | POA: Diagnosis not present

## 2020-07-21 DIAGNOSIS — M9902 Segmental and somatic dysfunction of thoracic region: Secondary | ICD-10-CM | POA: Diagnosis not present

## 2020-07-23 ENCOUNTER — Other Ambulatory Visit: Payer: Self-pay

## 2020-07-23 ENCOUNTER — Ambulatory Visit: Payer: BC Managed Care – PPO | Admitting: Nurse Practitioner

## 2020-07-23 VITALS — BP 108/74 | HR 72 | Temp 99.1°F | Resp 16

## 2020-07-23 DIAGNOSIS — S61412A Laceration without foreign body of left hand, initial encounter: Secondary | ICD-10-CM

## 2020-07-23 NOTE — Progress Notes (Signed)
   Subjective:    Patient ID: Sonia Martinez, female    DOB: Oct 13, 1980, 40 y.o.   MRN: [id number]  HPI  40 year old female suffered laceration on 07/19/20 from a knife while cutting Avocado- went to ED for assessment and treatment.   Had a TD injection in ED  Dermabond was used to close wound   On Keflex currently for 7 day regimen.   Here today to have wound checked, she did notice some bleeding from site today.   She has been dressing the area with gauze and mediwrap  Today's Vitals   07/23/20 1404  BP: 108/74  Pulse: 72  Resp: 16  Temp: 99.1 F (37.3 C)  TempSrc: Tympanic  SpO2: 97%   There is no height or weight on file to calculate BMI.  Review of Systems  Constitutional: Negative.   HENT: Negative.    Respiratory: Negative.    Cardiovascular: Negative.   Skin:  Positive for wound.      Objective:   Physical Exam Constitutional:      Appearance: Normal appearance.  Musculoskeletal:       Hands:  Skin:    Comments: Linear laceration between 1st and 2nd digits left hand. Edges well approximated. Dermabond in place. Edema noted to palmar surface just distal to wound without erythema. No signs of active infection. Sensation to distal digits intact.   Neurological:     Mental Status: She is alert.          Assessment & Plan:  Wound with appropriate healing noted, no signs of infection.   Continue to keep area covered to prevent infection.   Avoid submerging hand in water.   Finish antibiotics as prescribed.   Return to clinic with any signs of infection or if dermabond comes off prior to wound closure and there is a need for dressing reinforcement as discussed.

## 2020-07-29 ENCOUNTER — Other Ambulatory Visit: Payer: Self-pay | Admitting: *Deleted

## 2020-07-29 MED ORDER — FLUTICASONE PROPIONATE 50 MCG/ACT NA SUSP: 2.0000 | Freq: Every day | NASAL | 0 refills | Status: AC

## 2020-07-29 NOTE — Telephone Encounter (Signed)
Layne from [facility] left a voicemail stating that patient is requesting a refill on Flonase. Last refill 05/21/20  16G Last office visit 12/17/19 acute No upcoming appointment scheduled

## 2020-08-19 DIAGNOSIS — M531 Cervicobrachial syndrome: Secondary | ICD-10-CM | POA: Diagnosis not present

## 2020-08-19 DIAGNOSIS — M9901 Segmental and somatic dysfunction of cervical region: Secondary | ICD-10-CM | POA: Diagnosis not present

## 2020-08-19 DIAGNOSIS — M9902 Segmental and somatic dysfunction of thoracic region: Secondary | ICD-10-CM | POA: Diagnosis not present

## 2020-08-19 DIAGNOSIS — M530 Cervicocranial syndrome: Secondary | ICD-10-CM | POA: Diagnosis not present

## 2020-09-02 ENCOUNTER — Ambulatory Visit: Payer: BC Managed Care – PPO | Admitting: Medical

## 2020-09-02 ENCOUNTER — Encounter: Payer: Self-pay | Admitting: Medical

## 2020-09-02 ENCOUNTER — Other Ambulatory Visit: Payer: Self-pay

## 2020-09-02 VITALS — BP 120/74 | HR 69 | Temp 97.8°F | Resp 16 | Wt 224.4 lb

## 2020-09-02 DIAGNOSIS — J0100 Acute maxillary sinusitis, unspecified: Secondary | ICD-10-CM

## 2020-09-02 DIAGNOSIS — B3731 Acute candidiasis of vulva and vagina: Secondary | ICD-10-CM

## 2020-09-02 DIAGNOSIS — Z20822 Contact with and (suspected) exposure to covid-19: Secondary | ICD-10-CM

## 2020-09-02 DIAGNOSIS — R42 Dizziness and giddiness: Secondary | ICD-10-CM

## 2020-09-02 DIAGNOSIS — B373 Candidiasis of vulva and vagina: Secondary | ICD-10-CM

## 2020-09-02 DIAGNOSIS — H6983 Other specified disorders of Eustachian tube, bilateral: Secondary | ICD-10-CM

## 2020-09-02 DIAGNOSIS — Z889 Allergy status to unspecified drugs, medicaments and biological substances status: Secondary | ICD-10-CM

## 2020-09-02 LAB — POC COVID19 BINAXNOW: SARS Coronavirus 2 Ag: NEGATIVE

## 2020-09-02 MED ORDER — PREDNISONE 10 MG (21) PO TBPK: ORAL_TABLET | ORAL | 0 refills | Status: DC

## 2020-09-02 MED ORDER — AMOXICILLIN-POT CLAVULANATE 875-125 MG PO TABS: 1.0000 | ORAL_TABLET | Freq: Two times a day (BID) | ORAL | 0 refills | Status: DC

## 2020-09-02 MED ORDER — FLUCONAZOLE 150 MG PO TABS: 150.0000 mg | ORAL_TABLET | Freq: Every day | ORAL | 0 refills | Status: DC

## 2020-09-02 NOTE — Progress Notes (Addendum)
Subjective:    Patient ID: Sonia Martinez, female    DOB: 06-20-1980, 40 y.o.   MRN: [id number]  HPI 40 yo female in non acute distress Fullness  both ears  x  less than 7 days. Vertigo last his am upon waking. Sinus pain and pressure maxillary area L>R  Daughter with strep throat.currently on antibiotics Husband drove patient to clinic  Pfizer vaccinated and  1 booster. Blood pressure 120/74, pulse 69, temperature 97.8 F (36.6 C), temperature source Oral, resp. rate 16, weight 224 lb 6.4 oz (101.8 kg), last menstrual period 08/27/2020, SpO2 99 %, unknown if currently breastfeeding.   Allergies  Allergen Reactions  . Coconut Oil Hives    ANYTHING WITH COCONUT IN IT  . Dulera [Mometasone Furo-Formoterol Fum]     Patient can only take advair  . Ciprofloxacin Hives and Rash  . Hydrocortisone Hives and Rash     Review of Systems  Constitutional:  Negative for chills and fever.  HENT:  Positive for congestion (little), sinus pressure and sinus pain. Negative for ear discharge, postnasal drip and rhinorrhea.   Respiratory:  Negative for cough and shortness of breath.   Cardiovascular:  Negative for chest pain.  Gastrointestinal:  Negative for abdominal pain and diarrhea.  Allergic/Immunologic: Positive for environmental allergies.  Neurological:  Positive for dizziness (felt like the room was spinning prior symptoms with  otitis media). Negative for syncope and headaches.      Objective:   Physical Exam Vitals and nursing note reviewed.  HENT:     Head: Normocephalic and atraumatic.     Jaw: There is normal jaw occlusion.     Right Ear: Hearing, ear canal and external ear normal. A middle ear effusion is present.     Left Ear: Hearing, ear canal and external ear normal. A middle ear effusion is present.     Nose: Congestion present. No rhinorrhea.     Left Nostril: Occlusion present.     Left Turbinates: Swollen.     Mouth/Throat:     Lips: Pink.     Mouth:  Mucous membranes are moist.     Pharynx: Oropharynx is clear. No pharyngeal swelling, oropharyngeal exudate, posterior oropharyngeal erythema or uvula swelling.     Tonsils: No tonsillar exudate or tonsillar abscesses. 1+ on the right. 1+ on the left.     Comments: Uvula midline Eyes:     Extraocular Movements: Extraocular movements intact.     Conjunctiva/sclera: Conjunctivae normal.     Pupils: Pupils are equal, round, and reactive to light.  Cardiovascular:     Rate and Rhythm: Normal rate and regular rhythm.  Pulmonary:     Effort: Pulmonary effort is normal.     Breath sounds: Normal breath sounds.  Musculoskeletal:        General: Normal range of motion.     Cervical back: Normal range of motion and neck supple.  Skin:    General: Skin is warm and dry.  Neurological:     General: No focal deficit present.     Mental Status: She is alert and oriented to person, place, and time.     GCS: GCS eye subscore is 4. GCS verbal subscore is 5. GCS motor subscore is 6.     Cranial Nerves: Cranial nerves are intact.     Sensory: Sensation is intact.     Motor: Motor function is intact.     Coordination: Coordination is intact. Romberg sign negative. Coordination normal. Finger-Nose-Finger Test  and Heel to [facility] Test normal. Rapid alternating movements normal.     Gait: Gait is intact.  Psychiatric:        Mood and Affect: Mood normal.        Behavior: Behavior normal. Behavior is cooperative.        Thought Content: Thought content normal.        Judgment: Judgment normal.     Results for orders placed or performed in visit on 09/02/20 (from the past 24 hour(s))  POC COVID-19     Status: Normal   Collection Time: 09/02/20 11:24 AM  Result Value Ref Range   SARS Coronavirus 2 Ag Negative Negative        Assessment & Plan:  Eustachian tube dysfuncton Dizziness Sinusitis maxillary H/O seasonal allergies Vulvovaginal Candidiasis with antibiotic use. Continue allergy / asthma  medications Add in a Claritin 10mg  during the morning. Patient states she can take oral and injectable cortisone however cannot use topical. Meds ordered this encounter  Medications  . predniSONE (STERAPRED UNI-PAK 21 TAB) 10 MG (21) TBPK tablet    Sig: Take 6 tablets by mouth today then 5 tablets tomorrow then one less tablet everyday thereafter. Take with food.    Dispense:  21 tablet    Refill:  0  . amoxicillin-clavulanate (AUGMENTIN) 875-125 MG tablet    Sig: Take 1 tablet by mouth 2 (two) times daily.    Dispense:  20 tablet    Refill:  0  . fluconazole (DIFLUCAN) 150 MG tablet    Sig: Take 1 tablet (150 mg total) by mouth daily.    Dispense:  2 tablet    Refill:  0   If not improving by Friday call the office and will discuss Meclizine prescription. Patient verbalizes understanding and has no questions at discharge.

## 2020-09-10 DIAGNOSIS — M9902 Segmental and somatic dysfunction of thoracic region: Secondary | ICD-10-CM | POA: Diagnosis not present

## 2020-09-10 DIAGNOSIS — M530 Cervicocranial syndrome: Secondary | ICD-10-CM | POA: Diagnosis not present

## 2020-09-10 DIAGNOSIS — M531 Cervicobrachial syndrome: Secondary | ICD-10-CM | POA: Diagnosis not present

## 2020-09-10 DIAGNOSIS — M9901 Segmental and somatic dysfunction of cervical region: Secondary | ICD-10-CM | POA: Diagnosis not present

## 2020-09-15 DIAGNOSIS — M9901 Segmental and somatic dysfunction of cervical region: Secondary | ICD-10-CM | POA: Diagnosis not present

## 2020-09-15 DIAGNOSIS — M530 Cervicocranial syndrome: Secondary | ICD-10-CM | POA: Diagnosis not present

## 2020-09-15 DIAGNOSIS — M531 Cervicobrachial syndrome: Secondary | ICD-10-CM | POA: Diagnosis not present

## 2020-09-15 DIAGNOSIS — M9902 Segmental and somatic dysfunction of thoracic region: Secondary | ICD-10-CM | POA: Diagnosis not present

## 2020-09-18 ENCOUNTER — Other Ambulatory Visit: Payer: Self-pay | Admitting: Family

## 2020-09-21 DIAGNOSIS — J019 Acute sinusitis, unspecified: Secondary | ICD-10-CM | POA: Diagnosis not present

## 2020-09-21 DIAGNOSIS — J301 Allergic rhinitis due to pollen: Secondary | ICD-10-CM | POA: Diagnosis not present

## 2020-10-01 ENCOUNTER — Other Ambulatory Visit (INDEPENDENT_AMBULATORY_CARE_PROVIDER_SITE_OTHER): Payer: BC Managed Care – PPO | Admitting: Vascular Surgery

## 2020-10-08 ENCOUNTER — Encounter (INDEPENDENT_AMBULATORY_CARE_PROVIDER_SITE_OTHER): Payer: BC Managed Care – PPO

## 2020-10-13 DIAGNOSIS — M530 Cervicocranial syndrome: Secondary | ICD-10-CM | POA: Diagnosis not present

## 2020-10-13 DIAGNOSIS — M9902 Segmental and somatic dysfunction of thoracic region: Secondary | ICD-10-CM | POA: Diagnosis not present

## 2020-10-13 DIAGNOSIS — M531 Cervicobrachial syndrome: Secondary | ICD-10-CM | POA: Diagnosis not present

## 2020-10-13 DIAGNOSIS — M9901 Segmental and somatic dysfunction of cervical region: Secondary | ICD-10-CM | POA: Diagnosis not present

## 2020-10-29 ENCOUNTER — Other Ambulatory Visit (INDEPENDENT_AMBULATORY_CARE_PROVIDER_SITE_OTHER): Payer: BC Managed Care – PPO | Admitting: Vascular Surgery

## 2020-10-29 ENCOUNTER — Ambulatory Visit (INDEPENDENT_AMBULATORY_CARE_PROVIDER_SITE_OTHER): Payer: BC Managed Care – PPO | Admitting: Vascular Surgery

## 2020-11-05 ENCOUNTER — Other Ambulatory Visit (INDEPENDENT_AMBULATORY_CARE_PROVIDER_SITE_OTHER): Payer: BC Managed Care – PPO | Admitting: Vascular Surgery

## 2020-11-05 ENCOUNTER — Encounter (INDEPENDENT_AMBULATORY_CARE_PROVIDER_SITE_OTHER): Payer: BC Managed Care – PPO

## 2020-11-10 DIAGNOSIS — M530 Cervicocranial syndrome: Secondary | ICD-10-CM | POA: Diagnosis not present

## 2020-11-10 DIAGNOSIS — M9901 Segmental and somatic dysfunction of cervical region: Secondary | ICD-10-CM | POA: Diagnosis not present

## 2020-11-10 DIAGNOSIS — M531 Cervicobrachial syndrome: Secondary | ICD-10-CM | POA: Diagnosis not present

## 2020-11-10 DIAGNOSIS — M9902 Segmental and somatic dysfunction of thoracic region: Secondary | ICD-10-CM | POA: Diagnosis not present

## 2020-11-11 ENCOUNTER — Encounter (INDEPENDENT_AMBULATORY_CARE_PROVIDER_SITE_OTHER): Payer: BC Managed Care – PPO

## 2020-11-13 DIAGNOSIS — Z6835 Body mass index (BMI) 35.0-35.9, adult: Secondary | ICD-10-CM | POA: Diagnosis not present

## 2020-11-13 DIAGNOSIS — Z01419 Encounter for gynecological examination (general) (routine) without abnormal findings: Secondary | ICD-10-CM | POA: Diagnosis not present

## 2020-11-18 ENCOUNTER — Other Ambulatory Visit: Payer: Self-pay

## 2020-11-18 ENCOUNTER — Ambulatory Visit: Payer: BC Managed Care – PPO

## 2020-11-18 DIAGNOSIS — Z23 Encounter for immunization: Secondary | ICD-10-CM

## 2020-11-19 DIAGNOSIS — H6981 Other specified disorders of Eustachian tube, right ear: Secondary | ICD-10-CM | POA: Diagnosis not present

## 2020-11-19 DIAGNOSIS — Z130 Encounter for screening for diseases of the blood and blood-forming organs and certain disorders involving the immune mechanism: Secondary | ICD-10-CM | POA: Diagnosis not present

## 2020-11-19 DIAGNOSIS — Z0000 Encounter for general adult medical examination without abnormal findings: Secondary | ICD-10-CM | POA: Diagnosis not present

## 2020-11-19 DIAGNOSIS — J309 Allergic rhinitis, unspecified: Secondary | ICD-10-CM | POA: Diagnosis not present

## 2020-11-19 DIAGNOSIS — Z13220 Encounter for screening for lipoid disorders: Secondary | ICD-10-CM | POA: Diagnosis not present

## 2020-11-19 DIAGNOSIS — K219 Gastro-esophageal reflux disease without esophagitis: Secondary | ICD-10-CM | POA: Diagnosis not present

## 2020-11-19 DIAGNOSIS — Z131 Encounter for screening for diabetes mellitus: Secondary | ICD-10-CM | POA: Diagnosis not present

## 2020-11-30 ENCOUNTER — Ambulatory Visit (INDEPENDENT_AMBULATORY_CARE_PROVIDER_SITE_OTHER): Payer: BC Managed Care – PPO | Admitting: Vascular Surgery

## 2020-12-03 ENCOUNTER — Ambulatory Visit (INDEPENDENT_AMBULATORY_CARE_PROVIDER_SITE_OTHER): Payer: BC Managed Care – PPO | Admitting: Vascular Surgery

## 2020-12-06 IMAGING — MG MM DIGITAL DIAGNOSTIC UNILAT*R* W/ TOMO W/ CAD
2 series · 3 of 6 positions shown · non-contrast
Comparison: Baseline screening mammogram dated 11/08/2018.

CLINICAL DATA: Patient had a normal screening mammogram on
11/08/2018. The patient's physician palpated an abnormality in the
right breast following the screening mammogram.

EXAM:
DIGITAL DIAGNOSTIC RIGHT MAMMOGRAM WITH TOMO
ULTRASOUND RIGHT BREAST

[R TAN synth-2D]
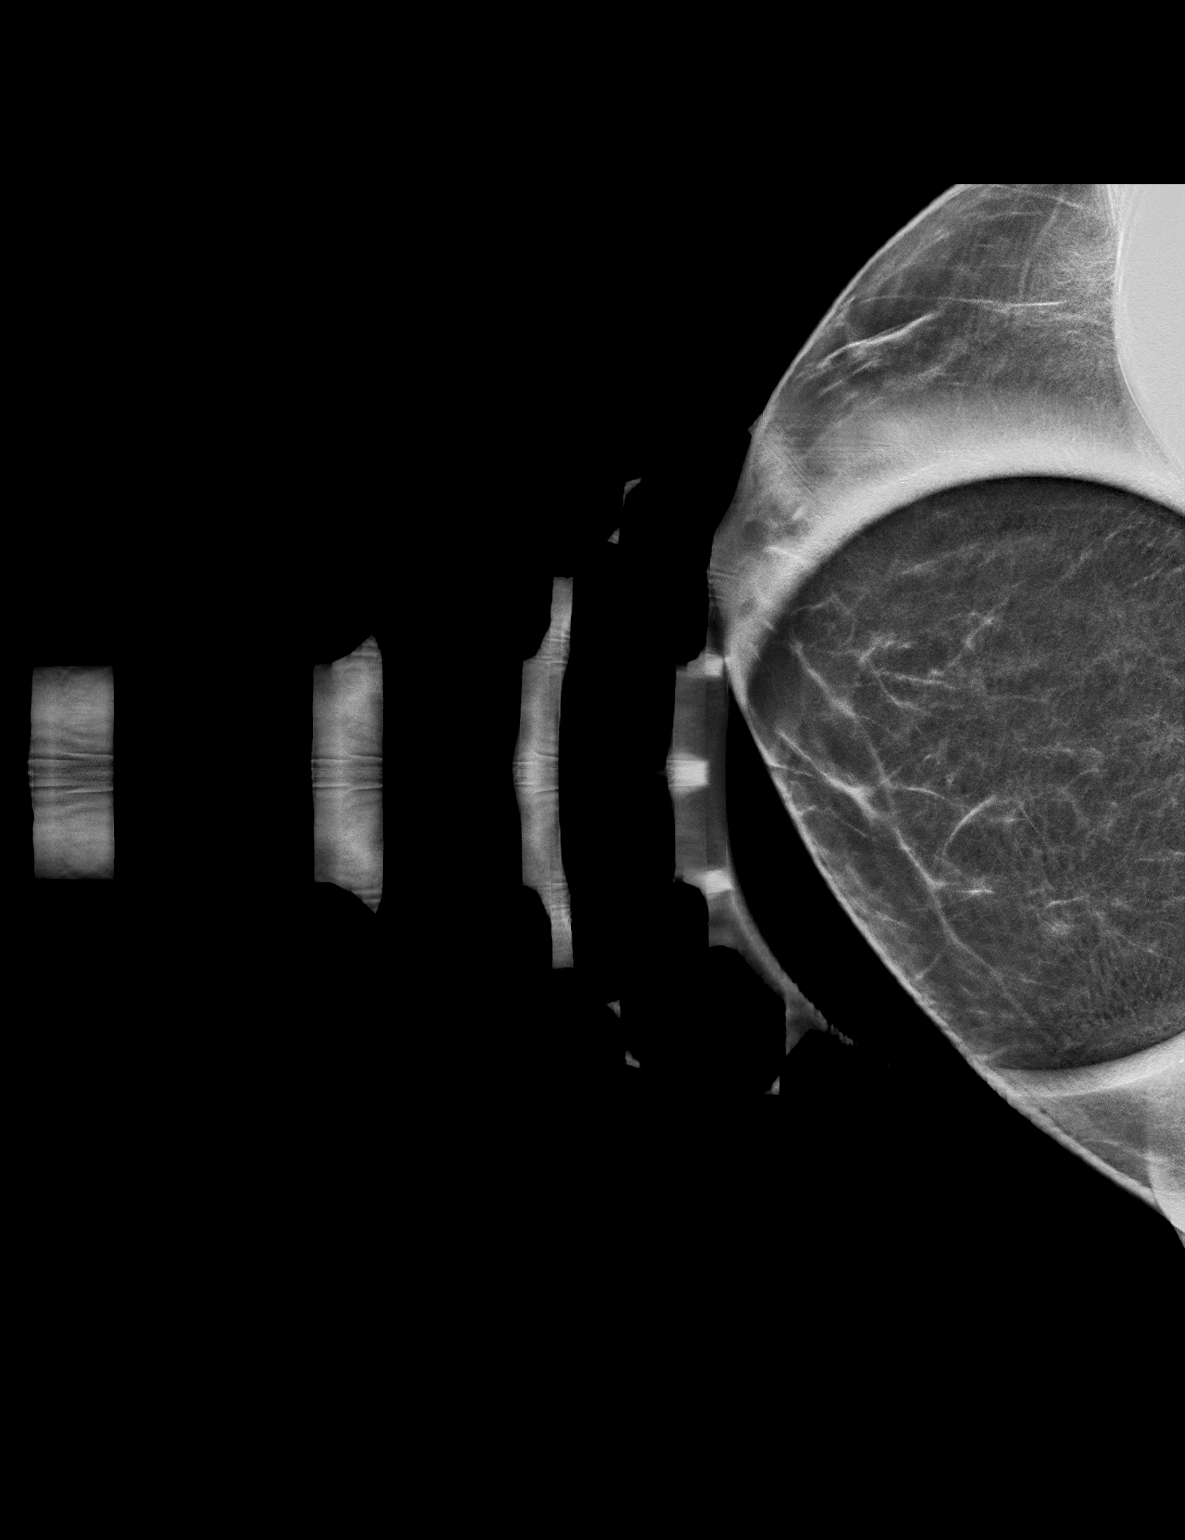

[R TAN tomo · 2 of 50 frames shown]
[frame 17/50]
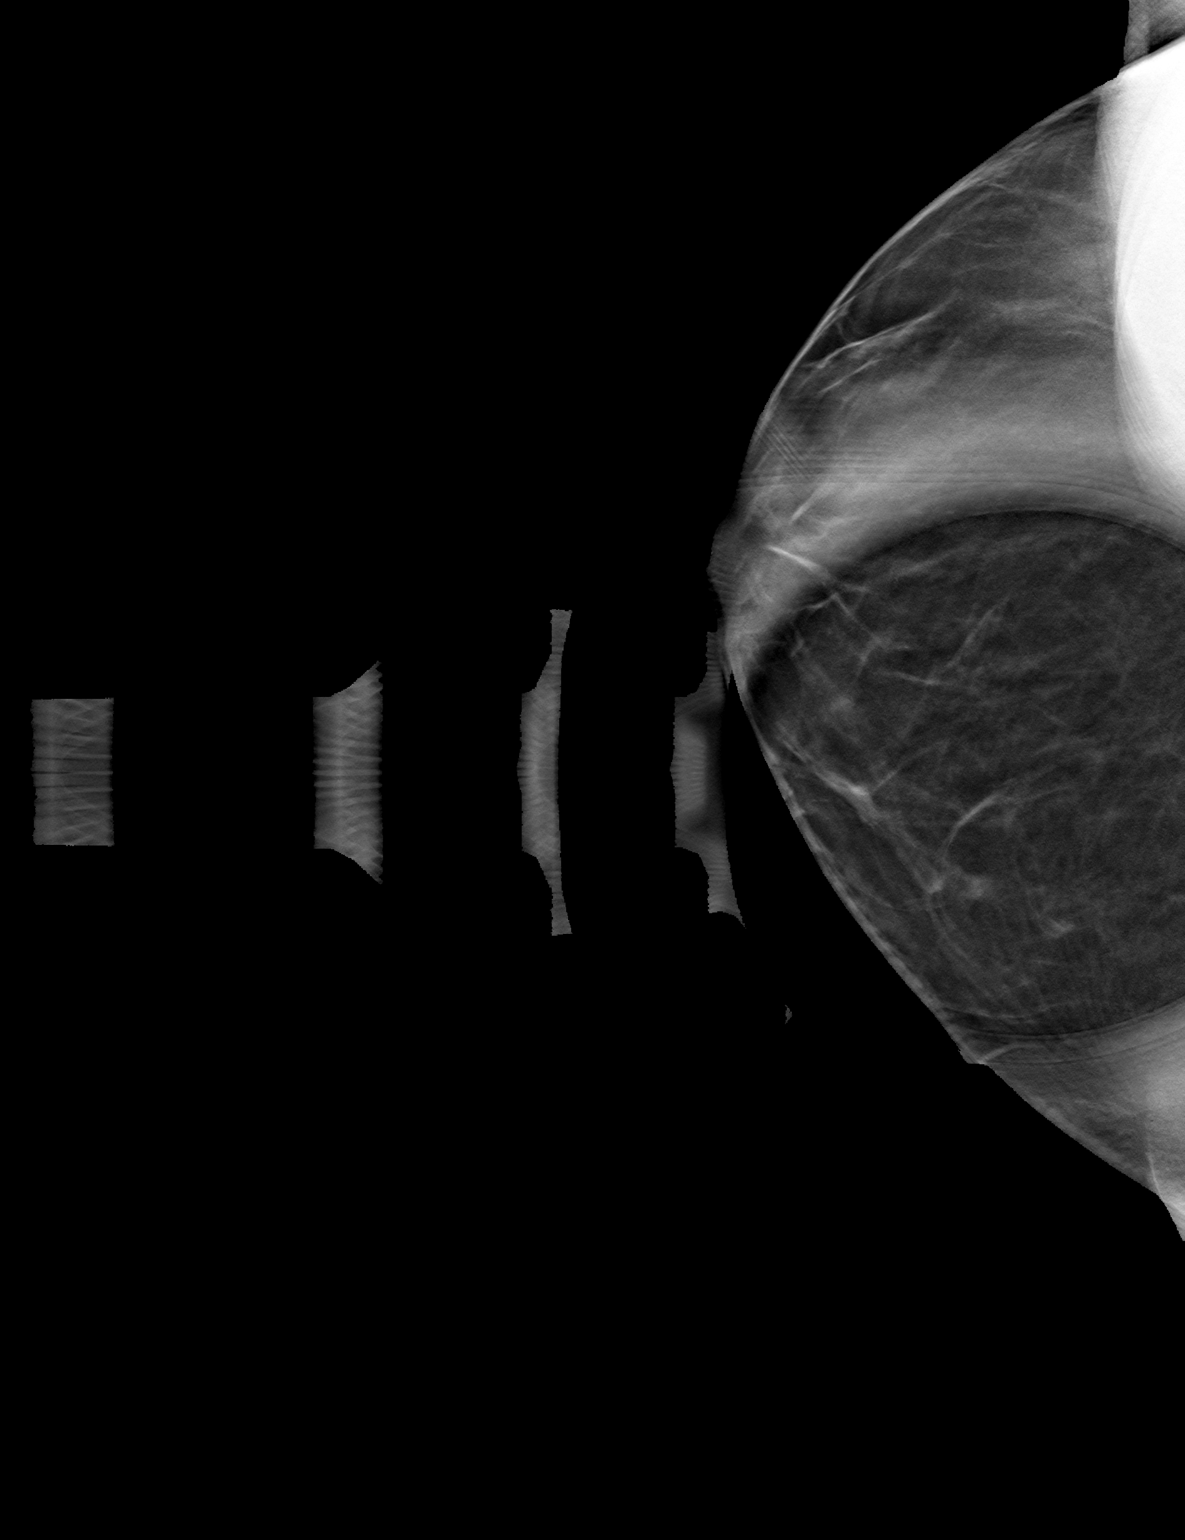
[frame 25/50]
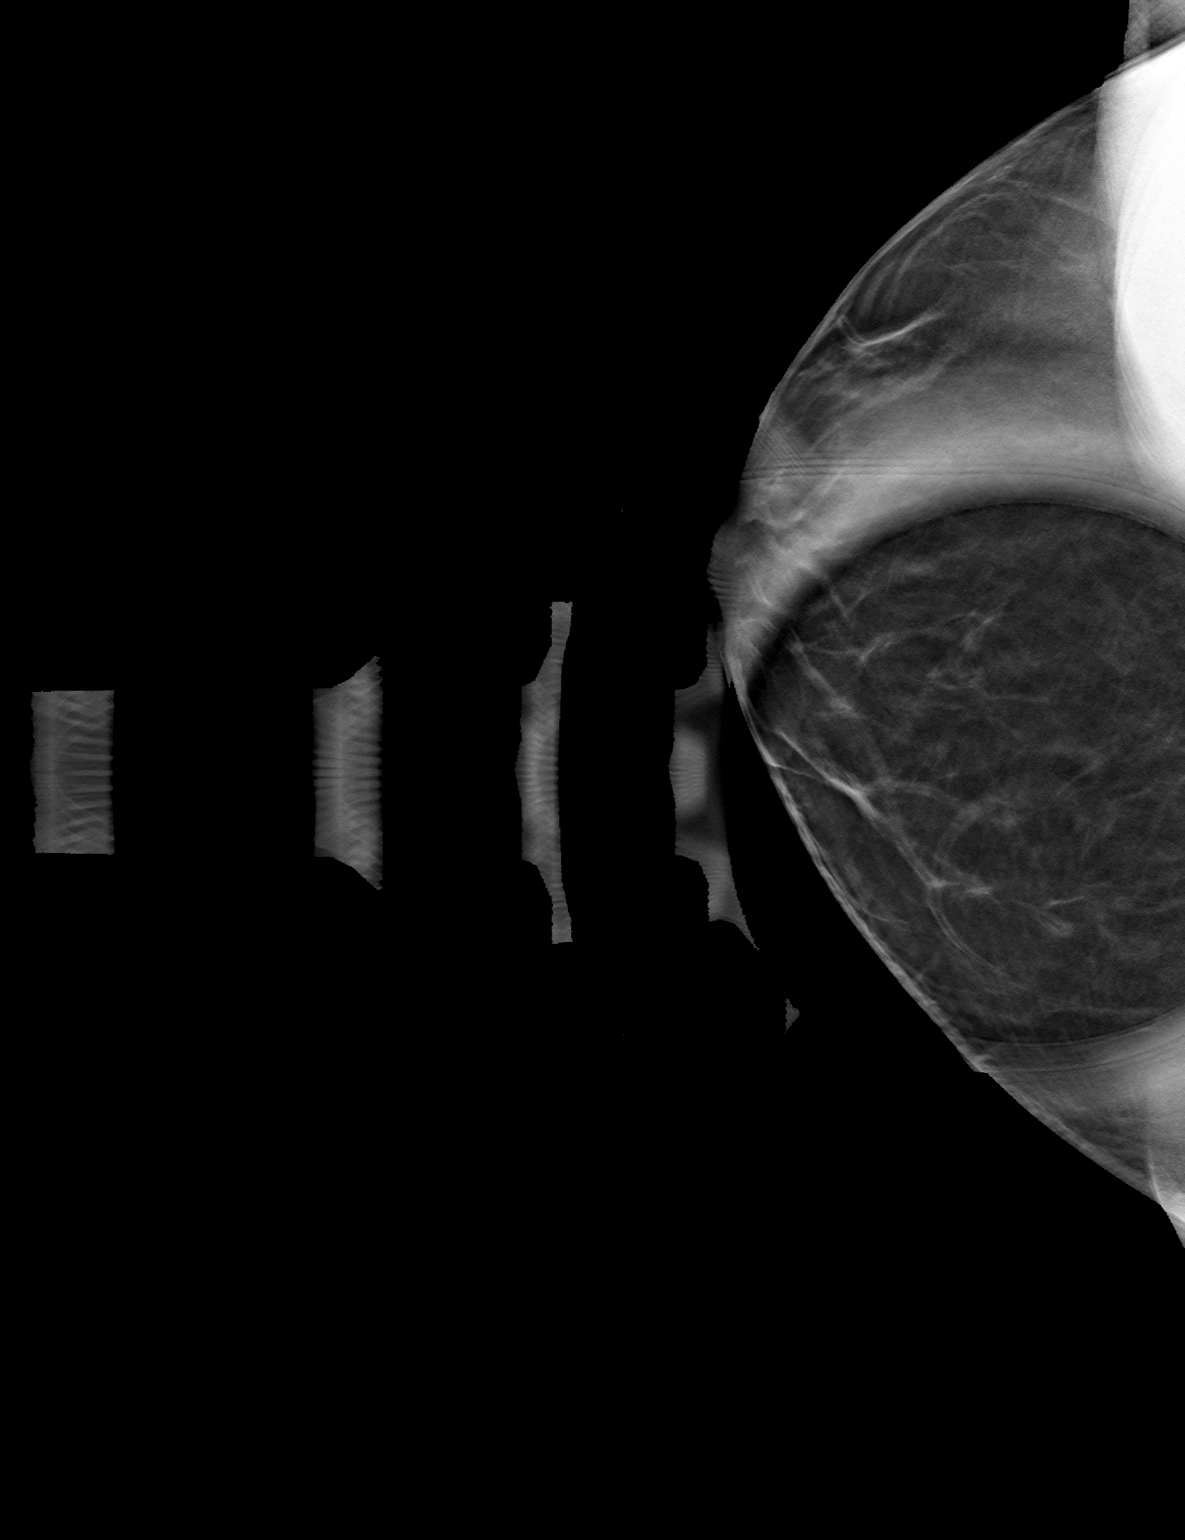

[3 of 6 positions shown; findings below may reference images not displayed]

ACR Breast Density Category b: There are scattered areas of
fibroglandular density.
FINDINGS: Spot tangential view of the area of clinical concern in the right
breast was performed. No suspicious mass, malignant type
microcalcifications or distortion detected.

On physical exam, I palpate soft thickening in the 3 o'clock region
of the right breast 4 cm from the nipple.

Targeted ultrasound is performed, showing normal tissue in the area
of clinical concern at 3-4 o'clock 4 cm from the nipple. No solid or
cystic mass, abnormal shadowing or distortion visualized.
IMPRESSION: No evidence of malignancy in the right breast.

RECOMMENDATION:
If the clinical exam remains benign/stable screening mammography can
be deferred until the age of 40.

I have discussed the findings and recommendations with the patient.
If applicable, a reminder letter will be sent to the patient
regarding the next appointment.

BI-RADS CATEGORY  1: Negative.

## 2020-12-09 ENCOUNTER — Ambulatory Visit: Payer: BC Managed Care – PPO | Admitting: Nurse Practitioner

## 2020-12-09 ENCOUNTER — Encounter: Payer: Self-pay | Admitting: Nurse Practitioner

## 2020-12-09 ENCOUNTER — Other Ambulatory Visit: Payer: Self-pay

## 2020-12-09 DIAGNOSIS — J4540 Moderate persistent asthma, uncomplicated: Secondary | ICD-10-CM

## 2020-12-09 DIAGNOSIS — M531 Cervicobrachial syndrome: Secondary | ICD-10-CM | POA: Diagnosis not present

## 2020-12-09 DIAGNOSIS — M9902 Segmental and somatic dysfunction of thoracic region: Secondary | ICD-10-CM | POA: Diagnosis not present

## 2020-12-09 DIAGNOSIS — Z20822 Contact with and (suspected) exposure to covid-19: Secondary | ICD-10-CM

## 2020-12-09 DIAGNOSIS — M530 Cervicocranial syndrome: Secondary | ICD-10-CM | POA: Diagnosis not present

## 2020-12-09 DIAGNOSIS — M9901 Segmental and somatic dysfunction of cervical region: Secondary | ICD-10-CM | POA: Diagnosis not present

## 2020-12-09 MED ORDER — ALBUTEROL SULFATE HFA 108 (90 BASE) MCG/ACT IN AERS: 2.0000 | INHALATION_SPRAY | RESPIRATORY_TRACT | 2 refills | Status: DC | PRN

## 2020-12-09 NOTE — Progress Notes (Signed)
Subjective:    Patient ID: Sonia Martinez, female    DOB: 1980/04/29, 40 y.o.   MRN: [id number]  HPI  89 ear old female presenting to [facility] with complaints of SOB this am. She went to use her Albuterol inhaler but it was empty.   She has had mild URI symptoms as well. She has been using sudafed and Mucinex for relief.   Denies fevers.   Today's Vitals   12/09/20 1113  BP: 120/82  Pulse: 99  Resp: 18  Temp: 98 F (36.7 C)  TempSrc: Tympanic  SpO2: 100%   There is no height or weight on file to calculate BMI.   Review of Systems  Constitutional: Negative.   HENT:  Positive for congestion and rhinorrhea.   Eyes: Negative.   Respiratory:  Positive for cough and shortness of breath.   Cardiovascular: Negative.   Gastrointestinal: Negative.   Genitourinary: Negative.   Musculoskeletal: Negative.   Neurological: Negative.   Hematological: Negative.     Past Medical History:  Diagnosis Date   Asthma    WELL CONTROLLED   Fatty liver    Folliculitis 03/31/2016   GERD (gastroesophageal reflux disease)    Herpes simplex virus (HSV) infection 03/23/2009   Overview:  Herpes Simplex  10/1 IMO update   History of methicillin resistant staphylococcus aureus (MRSA) 2013   + PCR SCREEN   IBS (irritable bowel syndrome)    Migraine without aura, without mention of intractable migraine without mention of status migrainosus 06/05/2013   Obesity    Oligohydramnios    2013   Pre-eclampsia    2013   Preeclampsia 01/05/2016   Pregnancy induced hypertension    Wrist joint pain 01/09/2017     Current Outpatient Medications  Medication Instructions   albuterol (PROAIR HFA) 108 (90 Base) MCG/ACT inhaler 2 puffs, Inhalation, Every 4 hours PRN   amoxicillin-clavulanate (AUGMENTIN) 875-125 MG tablet 1 tablet, Oral, 2 times daily   B-6 100 mg, Oral, Daily   Bacillus Coagulans-Inulin (PROBIOTIC-PREBIOTIC PO) 2 tablets, Oral, Daily   Cholecalciferol (VITAMIN D3)  125 MCG (5000 UT) CAPS 1 capsule, Oral, Daily   Cholecalciferol 50 MCG (2000 UT) CAPS Oral   Fish Oil 1,000 mg, Oral, Daily   fluconazole (DIFLUCAN) 150 mg, Oral, Daily   fluticasone (FLONASE) 50 MCG/ACT nasal spray 2 sprays, Each Nare, Daily   Loratadine (CLARITIN PO) Oral   Lysine 1,000 mg, Oral, Daily   Magnesium 400 MG CAPS Oral   montelukast (SINGULAIR) 10 MG tablet TAKE 1 TABLET BY MOUTH ONCE DAILY   Multiple Vitamin (MULTIVITAMIN) tablet 1 tablet, Oral, Daily   omeprazole (PRILOSEC) 20 mg, Oral, Daily   predniSONE (STERAPRED UNI-PAK 21 TAB) 10 MG (21) TBPK tablet Take 6 tablets by mouth today then 5 tablets tomorrow then one less tablet everyday thereafter. Take with food.   UNABLE TO FIND Med Name: Energy Blend - Vit B12 300mg , COQ-10 30mg , Goj Berry    UNABLE TO FIND ed Name: Copaiba 120mg     UNABLE TO FIND Med Name: Zendourine  (supplement)    UNABLE TO FIND Med Name: (supplement)    valACYclovir (VALTREX) 1,000 mg, Oral, 2 times daily PRN   vitamin C 500 mg, Oral, Daily       Objective:   Physical Exam HENT:     Head: Normocephalic.     Right Ear: Tympanic membrane, ear canal and external ear normal.     Left Ear: Tympanic membrane, ear canal  and external ear normal.     Nose: Rhinorrhea present.  Eyes:     Pupils: Pupils are equal, round, and reactive to light.  Cardiovascular:     Rate and Rhythm: Normal rate and regular rhythm.     Heart sounds: Normal heart sounds.  Pulmonary:     Effort: Pulmonary effort is normal.     Breath sounds: Normal breath sounds.  Musculoskeletal:     Cervical back: Normal range of motion.  Skin:    General: Skin is warm.     Capillary Refill: Capillary refill takes less than 2 seconds.  Neurological:     General: No focal deficit present.     Mental Status: She is alert.  Psychiatric:        Mood and Affect: Mood normal.          Assessment & Plan:  1. Moderate persistent asthma without complication  - albuterol  (PROAIR HFA) 108 (90 Base) MCG/ACT inhaler; Inhale 2 puffs into the lungs every 4 (four) hours as needed for wheezing or shortness of breath.  Dispense: 8 g; Refill: 2   Advised ongoing use of sudafed and mucinex for relief of URI symptoms, return to clinic if symptoms persist or with new concerns as discussed

## 2020-12-21 ENCOUNTER — Telehealth: Payer: BC Managed Care – PPO | Admitting: Family Medicine

## 2020-12-21 DIAGNOSIS — J069 Acute upper respiratory infection, unspecified: Secondary | ICD-10-CM | POA: Diagnosis not present

## 2020-12-21 MED ORDER — AMOXICILLIN-POT CLAVULANATE 875-125 MG PO TABS: 1.0000 | ORAL_TABLET | Freq: Two times a day (BID) | ORAL | 0 refills | Status: AC

## 2020-12-21 NOTE — Progress Notes (Signed)

## 2020-12-22 DIAGNOSIS — M79671 Pain in right foot: Secondary | ICD-10-CM | POA: Diagnosis not present

## 2020-12-22 DIAGNOSIS — M2011 Hallux valgus (acquired), right foot: Secondary | ICD-10-CM | POA: Diagnosis not present

## 2020-12-29 DIAGNOSIS — M9902 Segmental and somatic dysfunction of thoracic region: Secondary | ICD-10-CM | POA: Diagnosis not present

## 2020-12-29 DIAGNOSIS — M9901 Segmental and somatic dysfunction of cervical region: Secondary | ICD-10-CM | POA: Diagnosis not present

## 2020-12-29 DIAGNOSIS — M531 Cervicobrachial syndrome: Secondary | ICD-10-CM | POA: Diagnosis not present

## 2020-12-29 DIAGNOSIS — M530 Cervicocranial syndrome: Secondary | ICD-10-CM | POA: Diagnosis not present

## 2021-01-12 DIAGNOSIS — M9902 Segmental and somatic dysfunction of thoracic region: Secondary | ICD-10-CM | POA: Diagnosis not present

## 2021-01-12 DIAGNOSIS — M531 Cervicobrachial syndrome: Secondary | ICD-10-CM | POA: Diagnosis not present

## 2021-01-12 DIAGNOSIS — M530 Cervicocranial syndrome: Secondary | ICD-10-CM | POA: Diagnosis not present

## 2021-01-12 DIAGNOSIS — M9901 Segmental and somatic dysfunction of cervical region: Secondary | ICD-10-CM | POA: Diagnosis not present

## 2021-03-16 ENCOUNTER — Ambulatory Visit: Payer: BC Managed Care – PPO | Admitting: Medical

## 2021-03-16 ENCOUNTER — Encounter: Payer: Self-pay | Admitting: Medical

## 2021-03-16 ENCOUNTER — Other Ambulatory Visit: Payer: Self-pay

## 2021-03-16 VITALS — BP 112/72 | HR 73 | Temp 97.8°F | Resp 16

## 2021-03-16 DIAGNOSIS — R300 Dysuria: Secondary | ICD-10-CM

## 2021-03-16 LAB — POCT URINALYSIS DIPSTICK
Bilirubin, UA: NEGATIVE
Blood, UA: NEGATIVE
Glucose, UA: NEGATIVE
Ketones, UA: NEGATIVE
Nitrite, UA: NEGATIVE
Protein, UA: NEGATIVE
Spec Grav, UA: <=1.005 — AB (ref 1.010–1.025)
Urobilinogen, UA: 0.2 E.U./dL
pH, UA: 5 (ref 5.0–8.0)

## 2021-03-16 MED ORDER — NITROFURANTOIN MONOHYD MACRO 100 MG PO CAPS: 100.0000 mg | ORAL_CAPSULE | Freq: Two times a day (BID) | ORAL | 0 refills | Status: DC

## 2021-03-16 NOTE — Progress Notes (Signed)
° °  Subjective:    Patient ID: Sonia Martinez, female    DOB: 06/17/1980, 41 y.o.   MRN: [id number]  HPI 41 yo female in non acute distress   Review of Systems  Constitutional:  Negative for chills and fever.  Gastrointestinal:  Negative for abdominal pain.       Soft , non tender to palpation  Genitourinary:  Positive for decreased urine volume, dysuria, frequency and urgency.  Musculoskeletal:  Negative for back pain.      Objective:   Physical Exam Vitals and nursing note reviewed.  Constitutional:      Appearance: Normal appearance.  HENT:     Head: Normocephalic and atraumatic.  Eyes:     Extraocular Movements: Extraocular movements intact.     Conjunctiva/sclera: Conjunctivae normal.     Pupils: Pupils are equal, round, and reactive to light.  Pulmonary:     Effort: Pulmonary effort is normal.  Abdominal:     General: Bowel sounds are normal. There is no distension.     Palpations: Abdomen is soft. There is no mass.     Tenderness: There is no right CVA tenderness, left CVA tenderness, guarding or rebound.  Musculoskeletal:        General: Normal range of motion.  Skin:    General: Skin is warm and dry.  Neurological:     General: No focal deficit present.     Mental Status: She is alert and oriented to person, place, and time. Mental status is at baseline.  Psychiatric:        Mood and Affect: Mood normal.        Behavior: Behavior normal.        Thought Content: Thought content normal.        Judgment: Judgment normal.     No abdominal pain  Recent Results (from the past 2160 hour(s))  POCT urinalysis dipstick     Status: Abnormal   Collection Time: 03/16/21  9:00 AM  Result Value Ref Range   Color, UA yellow    Clarity, UA clear    Glucose, UA Negative Negative   Bilirubin, UA negative    Ketones, UA negative    Spec Grav, UA <=1.005 (A) 1.010 - 1.025   Blood, UA negative    pH, UA 5.0 5.0 - 8.0   Protein, UA Negative Negative    Urobilinogen, UA 0.2 0.2 or 1.0 E.U./dL   Nitrite, UA negative    Leukocytes, UA Trace (A) Negative   Appearance     Odor       DX: Cystitis without hematuria. Meds ordered this encounter  Medications   nitrofurantoin, macrocrystal-monohydrate, (MACROBID) 100 MG capsule    Sig: Take 1 capsule (100 mg total) by mouth 2 (two) times daily.    Dispense:  14 capsule    Refill:  0   Increase water intake, okay to use AZO x  2 days if symptoms not improving to call the office. A urinalysis and a culture were sent to [facility]. Patient verbalizes understanding an has no questions at discharge.

## 2021-03-17 LAB — URINALYSIS, ROUTINE W REFLEX MICROSCOPIC
Bilirubin, UA: NEGATIVE
Glucose, UA: NEGATIVE
Ketones, UA: NEGATIVE
Leukocytes,UA: NEGATIVE
Nitrite, UA: NEGATIVE
Protein,UA: NEGATIVE
RBC, UA: NEGATIVE
Specific Gravity, UA: 1.007 (ref 1.005–1.030)
Urobilinogen, Ur: 0.2 mg/dL (ref 0.2–1.0)
pH, UA: 7 (ref 5.0–7.5)

## 2021-03-22 LAB — URINE CULTURE

## 2021-03-30 ENCOUNTER — Other Ambulatory Visit: Payer: Self-pay | Admitting: Medical

## 2021-03-30 DIAGNOSIS — J0100 Acute maxillary sinusitis, unspecified: Secondary | ICD-10-CM

## 2021-04-16 ENCOUNTER — Encounter: Payer: Self-pay | Admitting: Medical

## 2021-04-16 DIAGNOSIS — B001 Herpesviral vesicular dermatitis: Secondary | ICD-10-CM

## 2021-04-16 MED ORDER — VALACYCLOVIR HCL 1 G PO TABS: 1000.0000 mg | ORAL_TABLET | Freq: Two times a day (BID) | ORAL | 11 refills | Status: AC

## 2021-08-24 ENCOUNTER — Encounter: Payer: Self-pay | Admitting: Physician Assistant

## 2021-08-24 ENCOUNTER — Ambulatory Visit (INDEPENDENT_AMBULATORY_CARE_PROVIDER_SITE_OTHER): Payer: Self-pay | Admitting: Physician Assistant

## 2021-08-24 VITALS — BP 112/70 | HR 71 | Temp 98.0°F | Wt 233.8 lb

## 2021-08-24 DIAGNOSIS — J4540 Moderate persistent asthma, uncomplicated: Secondary | ICD-10-CM

## 2021-08-24 LAB — POC COVID19 BINAXNOW: SARS Coronavirus 2 Ag: NEGATIVE

## 2021-08-24 NOTE — Progress Notes (Signed)
   Subjective:    Patient ID: Sonia Martinez, female    DOB: Oct 18, 1980, 41 y.o.   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  Of note-her  office is wrapping up significant renovations and there is a lot of dust in the air as well as obvious scents of cleaning products Sonia Martinez walks up multiple flights of stairs to her office and is aware that it bothered her today. Concerned.  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 has children at home- recent onset symptoms with return to work and school - will do Covid screen  Review of Systems As noted - has used 2 puffs of Albuterol twice today, anxious re dosage-  WNL     Objective:   Physical Exam VS as noted Alert ,interactive, not ill appearing Speaks rapidly in full sentences , no SOB Skin color good, no mucosal changes  Fields are clear to ascultation, 98% Breathing unlabored, no cough during exam Cap refill normal Ambulates without change in breathing      Assessment & Plan:  Covid negative Request that she use elevator today- she feels she must return to  her office environment this afternoon for interviews.  Consider mask outside as Sonia Martinez currently doing mulching and garden prep- as are peripheral agricultural areas.- mowing, dusts, molds, products in the air Use A/C in car- windows closed Steamy showers and hydration- hair covered with outside activities- wash before putting on pillow cases as increases overnight exposure.   Assured albuterol dose is currently WNL and may be repeated x 1-2 this afternoon/evening if needed.  Today has been her first awareness of change-- and she  has had multiple increased exposures after prolonged at home/inside environment. See how she feels in AM-- on-site provider returns in AM and  the two can add Advair or Rx of choice if so determined Patient  states she agrees with plan and will proceed in AM if concern, exacerbation or additional questions arise.

## 2021-08-25 ENCOUNTER — Ambulatory Visit (INDEPENDENT_AMBULATORY_CARE_PROVIDER_SITE_OTHER): Payer: Self-pay | Admitting: Nurse Practitioner

## 2021-08-25 DIAGNOSIS — J4541 Moderate persistent asthma with (acute) exacerbation: Secondary | ICD-10-CM

## 2021-08-25 MED ORDER — FLUTICASONE-SALMETEROL 100-50 MCG/ACT IN AEPB: 1.0000 | INHALATION_SPRAY | Freq: Two times a day (BID) | RESPIRATORY_TRACT | 3 refills | Status: DC

## 2021-08-25 NOTE — Progress Notes (Signed)
Virtual Visit Consent   Sonia Martinez, you are scheduled for a virtual visit with a [facility] provider today. Just as with appointments in the office, your consent must be obtained to participate. Your consent will be active for this visit and any virtual visit you may have with one of our providers in the next 365 days. If you have a MyChart account, a copy of this consent can be sent to you electronically.  As this is a virtual visit, video technology does not allow for your provider to perform a traditional examination. This may limit your provider's ability to fully assess your condition. If your provider identifies any concerns that need to be evaluated in person or the need to arrange testing (such as labs, EKG, etc.), we will make arrangements to do so. Although advances in technology are sophisticated, we cannot ensure that it will always work on either your end or our end. If the connection with a video visit is poor, the visit may have to be switched to a telephone visit. With either a video or telephone visit, we are not always able to ensure that we have a secure connection.  By engaging in this virtual visit, you consent to the provision of healthcare and authorize for your insurance to be billed (if applicable) for the services provided during this visit. Depending on your insurance coverage, you may receive a charge related to this service.  I need to obtain your verbal consent now. Are you willing to proceed with your visit today? Sonia Martinez has provided verbal consent on 08/25/2021 for a virtual visit (telephone). Viviano Simas, FNP  Date: 08/25/2021 1:59 PM  Virtual Visit via Video Note   I, Viviano Simas, connected with  Sonia Martinez  ([id number], [birth year]) on 08/25/21 at  2:00 PM EDT by a video-enabled telemedicine application and verified that I am speaking with the correct person using two identifiers.  Location: Patient: Virtual Visit Location Patient:  Home Provider: Virtual Visit Location Provider: Home Office   I discussed the limitations of evaluation and management by telemedicine and the availability of in person appointments. The patient expressed understanding and agreed to proceed.    History of Present Illness: Sonia Martinez is a 41 y.o. 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   Was on steroidal inhalers in the past Most recently on Advair   She did see a pulmonology in the part as well    Problems:  Patient Active Problem List   Diagnosis Date Noted   Cholelithiasis without obstruction 06/18/2020   Irritable bowel syndrome with constipation 06/18/2020   Elevated levels of transaminase & lactic acid dehydrogenase 06/18/2020   Flatulence, eructation and gas pain 06/18/2020   Right upper quadrant pain 06/18/2020   Gastroesophageal reflux disease 05/20/2019   Fatty liver    Carpal tunnel syndrome 01/09/2017   Wrist joint pain 01/09/2017   Folliculitis 03/31/2016   Buttock pain 02/16/2016   NSVD (normal spontaneous vaginal delivery) 01/06/2016   Preeclampsia 01/05/2016   Allergic rhinitis 09/22/2015   Abnormal MSAFP (maternal serum alpha-fetoprotein), elevated 09/01/2015   Migraine without aura, without mention of  intractable migraine without mention of status migrainosus 06/05/2013   Deviated nasal septum 02/18/2013   Asthma 02/22/2010   Vitamin D deficiency 11/04/2009   Headache 07/16/2009   Abnormal weight gain 07/02/2009   Exercise induced bronchospasm 04/01/2009   Obesity, unspecified 04/01/2009   Herpes simplex virus (HSV) infection 03/23/2009    Allergies:  Allergies  Allergen  Reactions   Coconut (Cocos Nucifera) Hives    ANYTHING WITH COCONUT IN IT   [facility] [Mometasone Furo-Formoterol Fum]     Patient can only take advair   Ciprofloxacin Hives and Rash   Hydrocortisone Hives and Rash   Medications:  Current Outpatient Medications:    albuterol (PROAIR HFA) 108 (90 Base) MCG/ACT inhaler, Inhale 2 puffs into the lungs every 4 (four) hours as needed for wheezing or shortness of breath., Disp: 8 g, Rfl: 2   Ascorbic Acid (VITAMIN C) 1000 MG tablet, Take 500 mg by mouth daily. , Disp: , Rfl:    Bacillus Coagulans-Inulin (PROBIOTIC-PREBIOTIC PO), Take 2 tablets by mouth daily., Disp: , Rfl:    Cholecalciferol (VITAMIN D3) 125 MCG (5000 UT) CAPS, Take 1 capsule by mouth daily., Disp: , Rfl:    Cholecalciferol 50 MCG (2000 UT) CAPS, Take by mouth., Disp: , Rfl:    fluconazole (DIFLUCAN) 150 MG tablet, Take 1 tablet (150 mg total) by mouth daily. (Patient not taking: Reported on 03/16/2021), Disp: 2 tablet, Rfl: 0   fluticasone (FLONASE) 50 MCG/ACT nasal spray, Place 2 sprays into both nostrils daily., Disp: 16 g, Rfl: 0   levocetirizine (XYZAL) 5 MG tablet, Take 5 mg by mouth every evening., Disp: , Rfl:    Loratadine (CLARITIN PO), Take by mouth. (Patient not taking: Reported on 08/24/2021), Disp: , Rfl:    Lysine 500 MG CAPS, Take 1,000 mg by mouth daily. , Disp: , Rfl:    Magnesium 400 MG CAPS, Take by mouth., Disp: , Rfl:    montelukast (SINGULAIR) 10 MG tablet, TAKE 1 TABLET BY MOUTH ONCE DAILY, Disp: 90 tablet, Rfl: 3   Multiple Vitamin (MULTIVITAMIN) tablet, Take 1 tablet by mouth daily., Disp: , Rfl:    nitrofurantoin, macrocrystal-monohydrate, (MACROBID) 100 MG capsule, Take 1 capsule (100 mg total) by mouth 2 (two) times daily. (Patient not taking: Reported on 08/24/2021), Disp: 14 capsule, Rfl: 0   Omega-3 Fatty Acids (FISH OIL) 1000 MG CAPS, Take 1,000 mg by mouth daily., Disp: , Rfl:    omeprazole (PRILOSEC) 20 MG capsule, Take 1 capsule (20 mg total) by mouth  daily., Disp: 90 capsule, Rfl: 3   predniSONE (STERAPRED UNI-PAK 21 TAB) 10 MG (21) TBPK tablet, Take 6 tablets by mouth today then 5 tablets tomorrow then one less tablet everyday thereafter. Take with food. (Patient not taking: Reported on 03/16/2021), Disp: 21 tablet, Rfl: 0   Pyridoxine HCl (B-6) 100 MG TABS, Take 100 mg by mouth daily., Disp: , Rfl:    UNABLE TO FIND, Med Name: Energy Blend - Vit B12 300mg , COQ-10 30mg , Goj Berry, Disp: , Rfl:    UNABLE TO FIND, ed Name: Copaiba 120mg , Disp: , Rfl:    UNABLE TO FIND, Med Name: Zendourine  (supplement), Disp: , Rfl:    UNABLE TO FIND, Med Name: Wilford Grist (supplement), Disp: , Rfl:    valACYclovir (VALTREX) 1000 MG tablet, Take 1 tablet (1,000 mg total) by mouth 2 (two) times daily as needed., Disp: 10 tablet, Rfl: 1  Observations/Objective: No physical exam performed:  Speech is clear and coherent with logical content.  Patient is alert and oriented at baseline.    Assessment and Plan: 1. Moderate persistent asthma with acute exacerbation  - fluticasone-salmeterol (ADVAIR) 100-50 MCG/ACT AEPB; Inhale 1 puff into the lungs 2 (two) times daily.  Dispense: 1 each; Refill: 3     Follow Up Instructions: I discussed the assessment and treatment plan with the patient. The patient was provided an opportunity to ask questions and all were answered. The patient agreed with the plan and demonstrated an understanding of the instructions.  A copy of instructions were sent to the patient via MyChart unless otherwise noted below.   The patient was advised to call back or seek an in-person evaluation if the symptoms worsen or if the condition fails to improve as anticipated.  Time:  I spent 15 minutes with the patient via telehealth technology discussing the above problems/concerns.    Viviano Simas, FNP

## 2021-10-05 DIAGNOSIS — Z713 Dietary counseling and surveillance: Secondary | ICD-10-CM | POA: Diagnosis not present

## 2021-10-12 DIAGNOSIS — M9901 Segmental and somatic dysfunction of cervical region: Secondary | ICD-10-CM | POA: Diagnosis not present

## 2021-10-12 DIAGNOSIS — M9902 Segmental and somatic dysfunction of thoracic region: Secondary | ICD-10-CM | POA: Diagnosis not present

## 2021-10-12 DIAGNOSIS — M530 Cervicocranial syndrome: Secondary | ICD-10-CM | POA: Diagnosis not present

## 2021-10-12 DIAGNOSIS — M531 Cervicobrachial syndrome: Secondary | ICD-10-CM | POA: Diagnosis not present

## 2021-10-26 DIAGNOSIS — Z713 Dietary counseling and surveillance: Secondary | ICD-10-CM | POA: Diagnosis not present

## 2021-11-08 DIAGNOSIS — M531 Cervicobrachial syndrome: Secondary | ICD-10-CM | POA: Diagnosis not present

## 2021-11-08 DIAGNOSIS — M9901 Segmental and somatic dysfunction of cervical region: Secondary | ICD-10-CM | POA: Diagnosis not present

## 2021-11-08 DIAGNOSIS — M9902 Segmental and somatic dysfunction of thoracic region: Secondary | ICD-10-CM | POA: Diagnosis not present

## 2021-11-08 DIAGNOSIS — M530 Cervicocranial syndrome: Secondary | ICD-10-CM | POA: Diagnosis not present

## 2021-11-10 DIAGNOSIS — J3489 Other specified disorders of nose and nasal sinuses: Secondary | ICD-10-CM | POA: Diagnosis not present

## 2021-11-16 DIAGNOSIS — Z01419 Encounter for gynecological examination (general) (routine) without abnormal findings: Secondary | ICD-10-CM | POA: Diagnosis not present

## 2021-11-16 DIAGNOSIS — Z6837 Body mass index (BMI) 37.0-37.9, adult: Secondary | ICD-10-CM | POA: Diagnosis not present

## 2021-11-16 DIAGNOSIS — R61 Generalized hyperhidrosis: Secondary | ICD-10-CM | POA: Diagnosis not present

## 2021-11-22 ENCOUNTER — Encounter (INDEPENDENT_AMBULATORY_CARE_PROVIDER_SITE_OTHER): Payer: Self-pay

## 2021-11-23 DIAGNOSIS — Z713 Dietary counseling and surveillance: Secondary | ICD-10-CM | POA: Diagnosis not present

## 2021-11-25 DIAGNOSIS — Z0000 Encounter for general adult medical examination without abnormal findings: Secondary | ICD-10-CM | POA: Diagnosis not present

## 2021-11-25 DIAGNOSIS — K760 Fatty (change of) liver, not elsewhere classified: Secondary | ICD-10-CM | POA: Diagnosis not present

## 2021-11-25 DIAGNOSIS — Z13220 Encounter for screening for lipoid disorders: Secondary | ICD-10-CM | POA: Diagnosis not present

## 2021-11-25 DIAGNOSIS — Z23 Encounter for immunization: Secondary | ICD-10-CM | POA: Diagnosis not present

## 2021-11-25 DIAGNOSIS — K219 Gastro-esophageal reflux disease without esophagitis: Secondary | ICD-10-CM | POA: Diagnosis not present

## 2021-11-25 DIAGNOSIS — Z1331 Encounter for screening for depression: Secondary | ICD-10-CM | POA: Diagnosis not present

## 2021-11-25 DIAGNOSIS — Z130 Encounter for screening for diseases of the blood and blood-forming organs and certain disorders involving the immune mechanism: Secondary | ICD-10-CM | POA: Diagnosis not present

## 2021-11-25 DIAGNOSIS — Z131 Encounter for screening for diabetes mellitus: Secondary | ICD-10-CM | POA: Diagnosis not present

## 2021-11-25 DIAGNOSIS — J309 Allergic rhinitis, unspecified: Secondary | ICD-10-CM | POA: Diagnosis not present

## 2021-11-30 ENCOUNTER — Ambulatory Visit (INDEPENDENT_AMBULATORY_CARE_PROVIDER_SITE_OTHER): Payer: Self-pay | Admitting: Physician Assistant

## 2021-11-30 ENCOUNTER — Encounter: Payer: Self-pay | Admitting: Physician Assistant

## 2021-11-30 VITALS — BP 120/72 | HR 72 | Temp 98.0°F | Wt 246.0 lb

## 2021-11-30 DIAGNOSIS — R55 Syncope and collapse: Secondary | ICD-10-CM

## 2021-11-30 DIAGNOSIS — B001 Herpesviral vesicular dermatitis: Secondary | ICD-10-CM

## 2021-11-30 LAB — GLUCOSE, POCT (MANUAL RESULT ENTRY): POC Glucose: 87 mg/dl (ref 70–99)

## 2021-11-30 NOTE — Progress Notes (Signed)
[facility] [address], [place] [address]   Office Visit Note  Patient Name: Sonia Martinez Date of Birth [id number]  Medical Record number [id number]  Date of Service: 11/30/2021  Chief Complaint  Patient presents with   Dizziness    Pt stated she felt dizzy today. Has a cold sore that popped up this morning. Husband tested pos for strep yesterday. No ST, cough, congested. Feels run down and almost passing out is why she is here.      41 y/o F presents to the clinic for c/o "almost passing out" earlier today while walking on campus and slight dizziness. Pt states she felt tingling on her lip last night and took Valtrex yesterday and today. Noticed a cold sore on her lip earlier today. No other changes. She feels well otherwise. Denies ear pain or drainage. NO open water activities. No changes in diet or medicines. No air travel. NO head or neck trauma. No light sensitivity. +h/o migraines, but OCP related and since discontinuation of medicine hasn't had migraines. No CP, SOB, sore throat, nasal congestion, body aches, or fever. NO family h/o arrhythmias.   Dizziness Pertinent negatives include no headaches, numbness or weakness.      Current Medication:  Outpatient Encounter Medications as of 11/30/2021  Medication Sig   Ascorbic Acid (VITAMIN C) 1000 MG tablet Take 500 mg by mouth daily.    Bacillus Coagulans-Inulin (PROBIOTIC-PREBIOTIC PO) Take 2 tablets by mouth daily.   Cholecalciferol (VITAMIN D3) 125 MCG (5000 UT) CAPS Take 1 capsule by mouth daily.   Cholecalciferol 50 MCG (2000 UT) CAPS Take by mouth.   fluticasone (FLONASE) 50 MCG/ACT nasal spray Place 2 sprays into both nostrils daily.   levocetirizine (XYZAL) 5 MG tablet Take 5 mg by mouth every evening.   Lysine 500 MG CAPS Take 1,000 mg by mouth daily.    Magnesium 400 MG CAPS Take by mouth.   montelukast (SINGULAIR) 10 MG tablet TAKE 1 TABLET BY MOUTH ONCE DAILY   Multiple Vitamin  (MULTIVITAMIN) tablet Take 1 tablet by mouth daily.   Omega-3 Fatty Acids (FISH OIL) 1000 MG CAPS Take 1,000 mg by mouth daily.   omeprazole (PRILOSEC) 20 MG capsule Take 1 capsule (20 mg total) by mouth daily.   Pyridoxine HCl (B-6) 100 MG TABS Take 100 mg by mouth daily.   valACYclovir (VALTREX) 1000 MG tablet Take 1 tablet (1,000 mg total) by mouth 2 (two) times daily as needed.   albuterol (PROAIR HFA) 108 (90 Base) MCG/ACT inhaler Inhale 2 puffs into the lungs every 4 (four) hours as needed for wheezing or shortness of breath. (Patient not taking: Reported on 11/30/2021)   fluconazole (DIFLUCAN) 150 MG tablet Take 1 tablet (150 mg total) by mouth daily. (Patient not taking: Reported on 03/16/2021)   fluticasone-salmeterol (ADVAIR) 100-50 MCG/ACT AEPB Inhale 1 puff into the lungs 2 (two) times daily.   Loratadine (CLARITIN PO) Take by mouth. (Patient not taking: Reported on 08/24/2021)   nitrofurantoin, macrocrystal-monohydrate, (MACROBID) 100 MG capsule Take 1 capsule (100 mg total) by mouth 2 (two) times daily. (Patient not taking: Reported on 08/24/2021)   predniSONE (STERAPRED UNI-PAK 21 TAB) 10 MG (21) TBPK tablet Take 6 tablets by mouth today then 5 tablets tomorrow then one less tablet everyday thereafter. Take with food. (Patient not taking: Reported on 03/16/2021)   UNABLE TO FIND Med Name: Energy Blend - Vit B12 300mg , COQ-10 30mg , Goj Berry   UNABLE TO FIND ed Name:  Copaiba 120mg    UNABLE TO FIND Med Name: Zendourine  (supplement)   UNABLE TO FIND Med Name: (supplement)   No facility-administered encounter medications on file as of 11/30/2021.      Medical History: Past Medical History:  Diagnosis Date   Asthma    WELL CONTROLLED   Fatty liver    Folliculitis 03/31/2016   GERD (gastroesophageal reflux disease)    Herpes simplex virus (HSV) infection 03/23/2009   Overview:  Herpes Simplex  10/1 IMO update   History of methicillin resistant staphylococcus aureus (MRSA) 2013    + PCR SCREEN   IBS (irritable bowel syndrome)    Migraine without aura, without mention of intractable migraine without mention of status migrainosus 06/05/2013   Obesity    Oligohydramnios    2013   Pre-eclampsia    2013   Preeclampsia 01/05/2016   Pregnancy induced hypertension    Wrist joint pain 01/09/2017     Vital Signs: BP 120/72 (BP Location: Left Arm, Patient Position: Sitting, Cuff Size: Normal)   Pulse 72   Temp 98 F (36.7 C) (Tympanic)   Wt 246 lb (111.6 kg)   SpO2 98%   BMI 38.53 kg/m    Review of Systems  Constitutional: Negative.   HENT: Negative.    Eyes: Negative.   Respiratory: Negative.    Cardiovascular: Negative.   Neurological:  Positive for dizziness. Negative for tremors, seizures, syncope (near syncope), speech difficulty, weakness, light-headedness, numbness and headaches.  Psychiatric/Behavioral: Negative.      Physical Exam Constitutional:      Appearance: Normal appearance.  HENT:     Head: Normocephalic and atraumatic.     Right Ear: Tympanic membrane, ear canal and external ear normal.     Left Ear: Tympanic membrane, ear canal and external ear normal.     Mouth/Throat:     Mouth: Mucous membranes are moist.     Pharynx: Oropharynx is clear. Posterior oropharyngeal erythema: sore.     Comments: Slight post nasal drainage present Eyes:     Extraocular Movements: Extraocular movements intact.  Cardiovascular:     Rate and Rhythm: Normal rate and regular rhythm.  Pulmonary:     Effort: Pulmonary effort is normal.     Breath sounds: Normal breath sounds. No rales.  Skin:    General: Skin is warm and dry.  Neurological:     Mental Status: She is alert and oriented to person, place, and time.  Psychiatric:        Mood and Affect: Mood normal.        Behavior: Behavior normal.       Assessment/Plan:  1. Near syncope - EKG 12-Lead - POCT Glucose (CBG)  2. Recurrent cold sores  Reviewed normal random bld glucose level  (87) with patient today. She verbalized understanding. Reviewed NSR EKG result with patient. Stay well hydrated. Continue to watch for worsening symptoms. Go to ER or call 911 if feelings of passing out reoccurs. RTC if any difficulty arises.     General Counseling: aamiyah derrick understanding of the findings of todays visit and agrees with plan of treatment. I have discussed any further diagnostic evaluation that may be needed or ordered today. We also reviewed her medications today. she has been encouraged to call the office with any questions or concerns that should arise related to todays visit.    Time spent:30 Minutes    Erasmo Score, [facility] Physician Assistant

## 2021-12-07 DIAGNOSIS — M9902 Segmental and somatic dysfunction of thoracic region: Secondary | ICD-10-CM | POA: Diagnosis not present

## 2021-12-07 DIAGNOSIS — M531 Cervicobrachial syndrome: Secondary | ICD-10-CM | POA: Diagnosis not present

## 2021-12-07 DIAGNOSIS — M530 Cervicocranial syndrome: Secondary | ICD-10-CM | POA: Diagnosis not present

## 2021-12-07 DIAGNOSIS — M9901 Segmental and somatic dysfunction of cervical region: Secondary | ICD-10-CM | POA: Diagnosis not present

## 2021-12-14 DIAGNOSIS — Z713 Dietary counseling and surveillance: Secondary | ICD-10-CM | POA: Diagnosis not present

## 2021-12-30 ENCOUNTER — Ambulatory Visit (INDEPENDENT_AMBULATORY_CARE_PROVIDER_SITE_OTHER): Payer: Self-pay | Admitting: Physician Assistant

## 2021-12-30 ENCOUNTER — Encounter: Payer: Self-pay | Admitting: Physician Assistant

## 2021-12-30 VITALS — BP 120/78 | HR 70 | Temp 98.3°F

## 2021-12-30 DIAGNOSIS — R051 Acute cough: Secondary | ICD-10-CM

## 2021-12-30 DIAGNOSIS — J0110 Acute frontal sinusitis, unspecified: Secondary | ICD-10-CM

## 2021-12-30 MED ORDER — BENZONATATE 200 MG PO CAPS
200.0000 mg | ORAL_CAPSULE | Freq: Two times a day (BID) | ORAL | 0 refills | Status: DC | PRN
Start: 2021-12-30 — End: 2023-03-20

## 2021-12-30 MED ORDER — AMOXICILLIN-POT CLAVULANATE 875-125 MG PO TABS: 1.0000 | ORAL_TABLET | Freq: Two times a day (BID) | ORAL | 0 refills | Status: AC

## 2021-12-30 NOTE — Progress Notes (Signed)
[facility] [address], [place] [address]   Office Visit Note  Patient Name: Sonia Martinez Date of Birth [id number]  Medical Record number [id number]  Date of Service: 12/30/2021  Chief Complaint  Patient presents with   Sinusitis    Started over the weekend. Coughing started Tue. Night.      41 y/o F presents to the clinic for c/o cough x 4-5 days. Pt denies fever, CP, SOB, body aches. Denies URI symptoms. She doesn't feel head congestion, post nasal drainage, sore throat. She does state her cough is worse in the morning with phlegm and gets dry during the day. +h/o asthma. Currently taking off-brand mucinex with phenylephrine, oral anti-histamine and using Flonase nasal spray. She is also using a humidifier in the house.     Sinusitis Associated symptoms include coughing. Pertinent negatives include no shortness of breath.      Current Medication:  Outpatient Encounter Medications as of 12/30/2021  Medication Sig   amoxicillin-clavulanate (AUGMENTIN) 875-125 MG tablet Take 1 tablet by mouth 2 (two) times daily for 5 days.   Ascorbic Acid (VITAMIN C) 1000 MG tablet Take 500 mg by mouth daily.    Bacillus Coagulans-Inulin (PROBIOTIC-PREBIOTIC PO) Take 2 tablets by mouth daily.   benzonatate (TESSALON) 200 MG capsule Take 1 capsule (200 mg total) by mouth 2 (two) times daily as needed for cough.   Cholecalciferol (VITAMIN D3) 125 MCG (5000 UT) CAPS Take 1 capsule by mouth daily.   Cholecalciferol 50 MCG (2000 UT) CAPS Take by mouth.   fluticasone (FLONASE) 50 MCG/ACT nasal spray Place 2 sprays into both nostrils daily.   levocetirizine (XYZAL) 5 MG tablet Take 5 mg by mouth every evening.   Lysine 500 MG CAPS Take 1,000 mg by mouth daily.    Magnesium 400 MG CAPS Take by mouth.   montelukast (SINGULAIR) 10 MG tablet TAKE 1 TABLET BY MOUTH ONCE DAILY   Multiple Vitamin (MULTIVITAMIN) tablet Take 1 tablet by mouth daily.   Omega-3 Fatty Acids (FISH OIL)  1000 MG CAPS Take 1,000 mg by mouth daily.   omeprazole (PRILOSEC) 20 MG capsule Take 1 capsule (20 mg total) by mouth daily.   Pyridoxine HCl (B-6) 100 MG TABS Take 100 mg by mouth daily.   UNABLE TO FIND Med Name: Energy Blend - Vit B12 300mg , COQ-10 30mg , Goj Berry   UNABLE TO FIND ed Name: Copaiba 120mg    UNABLE TO FIND Med Name: Zendourine  (supplement)   UNABLE TO FIND Med Name: Wilford Grist (supplement)   albuterol (PROAIR HFA) 108 (90 Base) MCG/ACT inhaler Inhale 2 puffs into the lungs every 4 (four) hours as needed for wheezing or shortness of breath. (Patient not taking: Reported on 11/30/2021)   fluconazole (DIFLUCAN) 150 MG tablet Take 1 tablet (150 mg total) by mouth daily. (Patient not taking: Reported on 03/16/2021)   fluticasone-salmeterol (ADVAIR) 100-50 MCG/ACT AEPB Inhale 1 puff into the lungs 2 (two) times daily.   Loratadine (CLARITIN PO) Take by mouth. (Patient not taking: Reported on 08/24/2021)   nitrofurantoin, macrocrystal-monohydrate, (MACROBID) 100 MG capsule Take 1 capsule (100 mg total) by mouth 2 (two) times daily. (Patient not taking: Reported on 08/24/2021)   predniSONE (STERAPRED UNI-PAK 21 TAB) 10 MG (21) TBPK tablet Take 6 tablets by mouth today then 5 tablets tomorrow then one less tablet everyday thereafter. Take with food. (Patient not taking: Reported on 03/16/2021)   valACYclovir (VALTREX) 1000 MG tablet Take 1 tablet (1,000 mg total) by  mouth 2 (two) times daily as needed.   No facility-administered encounter medications on file as of 12/30/2021.      Medical History: Past Medical History:  Diagnosis Date   Asthma    WELL CONTROLLED   Fatty liver    Folliculitis [id number]   GERD (gastroesophageal reflux disease)    Herpes simplex virus (HSV) infection 03/23/2009   Overview:  Herpes Simplex  10/1 IMO update   History of methicillin resistant staphylococcus aureus (MRSA) 2013   + PCR SCREEN   IBS (irritable bowel syndrome)    Migraine without aura, without  mention of intractable migraine without mention of status migrainosus 06/05/2013   Obesity    Oligohydramnios    2013   Pre-eclampsia    2013   Preeclampsia 01/05/2016   Pregnancy induced hypertension    Wrist joint pain 01/09/2017     Vital Signs: BP 120/78 (BP Location: Left Arm, Patient Position: Sitting, Cuff Size: Normal)   Pulse 70   Temp 98.3 F (36.8 C) (Tympanic)   SpO2 99%    Review of Systems  Constitutional: Negative.   HENT: Negative.    Respiratory:  Positive for cough. Negative for chest tightness, shortness of breath and wheezing.   Cardiovascular: Negative.   Neurological: Negative.     Physical Exam Constitutional:      Appearance: Normal appearance.  HENT:     Head: Atraumatic.     Right Ear: Tympanic membrane, ear canal and external ear normal.     Left Ear: Tympanic membrane, ear canal and external ear normal.     Nose:     Right Turbinates: Not enlarged.     Left Turbinates: Not enlarged.     Right Sinus: Frontal sinus tenderness present.     Left Sinus: Frontal sinus tenderness present.     Mouth/Throat:     Mouth: Mucous membranes are moist.     Pharynx: Oropharynx is clear.  Eyes:     Extraocular Movements: Extraocular movements intact.  Cardiovascular:     Rate and Rhythm: Normal rate and regular rhythm.  Pulmonary:     Effort: Pulmonary effort is normal.     Breath sounds: Normal breath sounds.  Musculoskeletal:     Cervical back: Neck supple.  Skin:    General: Skin is warm.  Neurological:     Mental Status: She is alert.  Psychiatric:        Mood and Affect: Mood normal.        Behavior: Behavior normal.        Thought Content: Thought content normal.        Judgment: Judgment normal.       Assessment/Plan:  1. Acute non-recurrent frontal sinusitis - amoxicillin-clavulanate (AUGMENTIN) 875-125 MG tablet; Take 1 tablet by mouth 2 (two) times daily for 5 days.  Dispense: 10 tablet; Refill: 0  2. Acute cough -  benzonatate (TESSALON) 200 MG capsule; Take 1 capsule (200 mg total) by mouth 2 (two) times daily as needed for cough.  Dispense: 20 capsule; Refill: 0  Stay well hydrated. Continue with humidifier Continue with off-brand mucinex Start otc oral decongestant ie Sudafed as directed on the bottle. Start Tessalon as prescribed. Rx for Augmentin was sent to pharmacy if her symptoms worsen with the recommendations above. She will not fill unless her symptoms worsen or don't improve. Pt verbalized understanding and in agreement.   If symptoms worsen then RTC  General Counseling: Chrissie Noa understanding of the findings of todays visit  and agrees with plan of treatment. I have discussed any further diagnostic evaluation that may be needed or ordered today. We also reviewed her medications today. she has been encouraged to call the office with any questions or concerns that should arise related to todays visit.    Time spent:20 Minutes    Gilberto Better, [place] Physician Assistant

## 2022-02-02 DIAGNOSIS — M530 Cervicocranial syndrome: Secondary | ICD-10-CM | POA: Diagnosis not present

## 2022-02-02 DIAGNOSIS — M9901 Segmental and somatic dysfunction of cervical region: Secondary | ICD-10-CM | POA: Diagnosis not present

## 2022-02-02 DIAGNOSIS — M9902 Segmental and somatic dysfunction of thoracic region: Secondary | ICD-10-CM | POA: Diagnosis not present

## 2022-02-02 DIAGNOSIS — M531 Cervicobrachial syndrome: Secondary | ICD-10-CM | POA: Diagnosis not present

## 2022-02-08 DIAGNOSIS — Z713 Dietary counseling and surveillance: Secondary | ICD-10-CM | POA: Diagnosis not present

## 2022-03-02 DIAGNOSIS — M9901 Segmental and somatic dysfunction of cervical region: Secondary | ICD-10-CM | POA: Diagnosis not present

## 2022-03-02 DIAGNOSIS — M531 Cervicobrachial syndrome: Secondary | ICD-10-CM | POA: Diagnosis not present

## 2022-03-02 DIAGNOSIS — M530 Cervicocranial syndrome: Secondary | ICD-10-CM | POA: Diagnosis not present

## 2022-03-02 DIAGNOSIS — M9902 Segmental and somatic dysfunction of thoracic region: Secondary | ICD-10-CM | POA: Diagnosis not present

## 2022-03-17 DIAGNOSIS — L709 Acne, unspecified: Secondary | ICD-10-CM | POA: Diagnosis not present

## 2022-03-30 DIAGNOSIS — M9902 Segmental and somatic dysfunction of thoracic region: Secondary | ICD-10-CM | POA: Diagnosis not present

## 2022-03-30 DIAGNOSIS — M9901 Segmental and somatic dysfunction of cervical region: Secondary | ICD-10-CM | POA: Diagnosis not present

## 2022-03-30 DIAGNOSIS — M530 Cervicocranial syndrome: Secondary | ICD-10-CM | POA: Diagnosis not present

## 2022-03-30 DIAGNOSIS — M531 Cervicobrachial syndrome: Secondary | ICD-10-CM | POA: Diagnosis not present

## 2022-04-05 DIAGNOSIS — Z713 Dietary counseling and surveillance: Secondary | ICD-10-CM | POA: Diagnosis not present

## 2022-04-07 DIAGNOSIS — H02882 Meibomian gland dysfunction right lower eyelid: Secondary | ICD-10-CM | POA: Diagnosis not present

## 2022-05-04 DIAGNOSIS — M9902 Segmental and somatic dysfunction of thoracic region: Secondary | ICD-10-CM | POA: Diagnosis not present

## 2022-05-04 DIAGNOSIS — M9901 Segmental and somatic dysfunction of cervical region: Secondary | ICD-10-CM | POA: Diagnosis not present

## 2022-05-04 DIAGNOSIS — M530 Cervicocranial syndrome: Secondary | ICD-10-CM | POA: Diagnosis not present

## 2022-05-04 DIAGNOSIS — N951 Menopausal and female climacteric states: Secondary | ICD-10-CM | POA: Diagnosis not present

## 2022-05-04 DIAGNOSIS — N920 Excessive and frequent menstruation with regular cycle: Secondary | ICD-10-CM | POA: Diagnosis not present

## 2022-05-04 DIAGNOSIS — M531 Cervicobrachial syndrome: Secondary | ICD-10-CM | POA: Diagnosis not present

## 2022-05-22 DIAGNOSIS — J069 Acute upper respiratory infection, unspecified: Secondary | ICD-10-CM | POA: Diagnosis not present

## 2022-06-09 DIAGNOSIS — L700 Acne vulgaris: Secondary | ICD-10-CM | POA: Diagnosis not present

## 2022-06-14 DIAGNOSIS — M531 Cervicobrachial syndrome: Secondary | ICD-10-CM | POA: Diagnosis not present

## 2022-06-14 DIAGNOSIS — M9901 Segmental and somatic dysfunction of cervical region: Secondary | ICD-10-CM | POA: Diagnosis not present

## 2022-06-14 DIAGNOSIS — M9902 Segmental and somatic dysfunction of thoracic region: Secondary | ICD-10-CM | POA: Diagnosis not present

## 2022-06-14 DIAGNOSIS — M530 Cervicocranial syndrome: Secondary | ICD-10-CM | POA: Diagnosis not present

## 2022-06-21 DIAGNOSIS — Z713 Dietary counseling and surveillance: Secondary | ICD-10-CM | POA: Diagnosis not present

## 2022-07-12 DIAGNOSIS — M531 Cervicobrachial syndrome: Secondary | ICD-10-CM | POA: Diagnosis not present

## 2022-07-12 DIAGNOSIS — M530 Cervicocranial syndrome: Secondary | ICD-10-CM | POA: Diagnosis not present

## 2022-07-12 DIAGNOSIS — M9902 Segmental and somatic dysfunction of thoracic region: Secondary | ICD-10-CM | POA: Diagnosis not present

## 2022-07-12 DIAGNOSIS — M9901 Segmental and somatic dysfunction of cervical region: Secondary | ICD-10-CM | POA: Diagnosis not present

## 2022-08-10 DIAGNOSIS — M9902 Segmental and somatic dysfunction of thoracic region: Secondary | ICD-10-CM | POA: Diagnosis not present

## 2022-08-10 DIAGNOSIS — M9901 Segmental and somatic dysfunction of cervical region: Secondary | ICD-10-CM | POA: Diagnosis not present

## 2022-08-10 DIAGNOSIS — M531 Cervicobrachial syndrome: Secondary | ICD-10-CM | POA: Diagnosis not present

## 2022-08-10 DIAGNOSIS — M530 Cervicocranial syndrome: Secondary | ICD-10-CM | POA: Diagnosis not present

## 2022-08-16 DIAGNOSIS — S93402A Sprain of unspecified ligament of left ankle, initial encounter: Secondary | ICD-10-CM | POA: Diagnosis not present

## 2022-08-16 DIAGNOSIS — M25572 Pain in left ankle and joints of left foot: Secondary | ICD-10-CM | POA: Diagnosis not present

## 2022-08-16 DIAGNOSIS — S5331XA Traumatic rupture of right ulnar collateral ligament, initial encounter: Secondary | ICD-10-CM | POA: Diagnosis not present

## 2022-08-16 DIAGNOSIS — M79644 Pain in right finger(s): Secondary | ICD-10-CM | POA: Diagnosis not present

## 2022-08-22 DIAGNOSIS — S93402A Sprain of unspecified ligament of left ankle, initial encounter: Secondary | ICD-10-CM | POA: Diagnosis not present

## 2022-08-31 DIAGNOSIS — S5331XA Traumatic rupture of right ulnar collateral ligament, initial encounter: Secondary | ICD-10-CM | POA: Diagnosis not present

## 2022-09-06 DIAGNOSIS — M531 Cervicobrachial syndrome: Secondary | ICD-10-CM | POA: Diagnosis not present

## 2022-09-06 DIAGNOSIS — M9902 Segmental and somatic dysfunction of thoracic region: Secondary | ICD-10-CM | POA: Diagnosis not present

## 2022-09-06 DIAGNOSIS — M530 Cervicocranial syndrome: Secondary | ICD-10-CM | POA: Diagnosis not present

## 2022-09-06 DIAGNOSIS — M9901 Segmental and somatic dysfunction of cervical region: Secondary | ICD-10-CM | POA: Diagnosis not present

## 2022-09-06 DIAGNOSIS — M79645 Pain in left finger(s): Secondary | ICD-10-CM | POA: Diagnosis not present

## 2022-09-06 DIAGNOSIS — S63641D Sprain of metacarpophalangeal joint of right thumb, subsequent encounter: Secondary | ICD-10-CM | POA: Diagnosis not present

## 2022-09-06 DIAGNOSIS — S5331XA Traumatic rupture of right ulnar collateral ligament, initial encounter: Secondary | ICD-10-CM | POA: Diagnosis not present

## 2022-09-13 DIAGNOSIS — Z713 Dietary counseling and surveillance: Secondary | ICD-10-CM | POA: Diagnosis not present

## 2022-09-15 DIAGNOSIS — L700 Acne vulgaris: Secondary | ICD-10-CM | POA: Diagnosis not present

## 2022-09-20 DIAGNOSIS — M530 Cervicocranial syndrome: Secondary | ICD-10-CM | POA: Diagnosis not present

## 2022-09-20 DIAGNOSIS — M9902 Segmental and somatic dysfunction of thoracic region: Secondary | ICD-10-CM | POA: Diagnosis not present

## 2022-09-20 DIAGNOSIS — M9901 Segmental and somatic dysfunction of cervical region: Secondary | ICD-10-CM | POA: Diagnosis not present

## 2022-09-20 DIAGNOSIS — M531 Cervicobrachial syndrome: Secondary | ICD-10-CM | POA: Diagnosis not present

## 2022-09-27 DIAGNOSIS — M9902 Segmental and somatic dysfunction of thoracic region: Secondary | ICD-10-CM | POA: Diagnosis not present

## 2022-09-27 DIAGNOSIS — M9901 Segmental and somatic dysfunction of cervical region: Secondary | ICD-10-CM | POA: Diagnosis not present

## 2022-09-27 DIAGNOSIS — M530 Cervicocranial syndrome: Secondary | ICD-10-CM | POA: Diagnosis not present

## 2022-09-27 DIAGNOSIS — M531 Cervicobrachial syndrome: Secondary | ICD-10-CM | POA: Diagnosis not present

## 2022-09-30 ENCOUNTER — Other Ambulatory Visit: Payer: Self-pay | Admitting: Nurse Practitioner

## 2022-09-30 DIAGNOSIS — J4541 Moderate persistent asthma with (acute) exacerbation: Secondary | ICD-10-CM

## 2022-10-03 ENCOUNTER — Other Ambulatory Visit: Payer: Self-pay | Admitting: Nurse Practitioner

## 2022-10-03 ENCOUNTER — Ambulatory Visit (INDEPENDENT_AMBULATORY_CARE_PROVIDER_SITE_OTHER): Payer: Self-pay | Admitting: Physician Assistant

## 2022-10-03 DIAGNOSIS — J4540 Moderate persistent asthma, uncomplicated: Secondary | ICD-10-CM

## 2022-10-03 DIAGNOSIS — J4541 Moderate persistent asthma with (acute) exacerbation: Secondary | ICD-10-CM

## 2022-10-03 MED ORDER — ALBUTEROL SULFATE HFA 108 (90 BASE) MCG/ACT IN AERS: 2.0000 | INHALATION_SPRAY | RESPIRATORY_TRACT | 2 refills | Status: AC | PRN

## 2022-10-03 MED ORDER — FLUTICASONE-SALMETEROL 100-50 MCG/ACT IN AEPB: 1.0000 | INHALATION_SPRAY | Freq: Two times a day (BID) | RESPIRATORY_TRACT | 3 refills | Status: DC

## 2022-10-03 NOTE — Progress Notes (Signed)
Virtual Visit Consent   Sonia Martinez, you are scheduled for a virtual visit with a [facility] provider today. Just as with appointments in the office, your consent must be obtained to participate. Your consent will be active for this visit and any virtual visit you may have with one of our providers in the next 365 days. If you have a MyChart account, a copy of this consent can be sent to you electronically.  As this is a virtual telephone visit which doesn't allow your provider to perform a traditional examination and may limit your provider's ability to fully assess your condition. If your provider identifies any concerns that need to be evaluated in person or the need to arrange testing (such as labs, EKG, etc.), we will make arrangements to do so. Although advances in technology are sophisticated, we cannot ensure that it will always work on either your end or our end. If the connection with a video visit is poor, the visit may have to be switched to a telephone visit. With either a video or telephone visit, we are not always able to ensure that we have a secure connection.  By engaging in this virtual visit, you consent to the provision of healthcare and authorize for your insurance to be billed (if applicable) for the services provided during this visit. Depending on your insurance coverage, you may receive a charge related to this service.  I need to obtain your verbal consent now. Are you willing to proceed with your visit today? Sonia Martinez has provided verbal consent on 10/03/2022 for a virtual visit (video or telephone). Gilberto Better, [place]  Date: 10/03/2022 11:06 AM  Virtual Visit via Telephone Note   I, Gilberto Better, connected with  Sonia Martinez  ([id number], 1980-03-16) on 10/03/22 at 11:30 AM EDT by a telephone and verified that I am speaking with the correct person using two identifiers.  Location: Patient: Virtual Visit Location Patient: Home Provider: Virtual  Visit Location Provider: Office/Clinic   I discussed the limitations of evaluation and management by telemedicine and the availability of in person appointments. The patient expressed understanding and agreed to proceed.    History of Present Illness: Sonia Martinez is a 42 y.o. who identifies as a female who was assigned female at birth, and is being seen today for medication refill of Advair and Albuterol inhaler.  HPI: 42 y/o F with h/o asthma requested medication refill of Advair and Albuterol inhaler. Pt doing well on these mediations. No asthma attack recently. Request to continue with same dose as previously prescribed.   Medication Refill    Problems:  Patient Active Problem List   Diagnosis Date Noted   Cholelithiasis without obstruction 06/18/2020   Irritable bowel syndrome with constipation 06/18/2020   Elevated levels of transaminase & lactic acid dehydrogenase 06/18/2020   Flatulence, eructation and gas pain 06/18/2020   Right upper quadrant pain 06/18/2020   Gastroesophageal reflux disease 05/20/2019   Fatty liver    Carpal tunnel syndrome 01/09/2017   Wrist joint pain 01/09/2017   Folliculitis 03/31/2016   Buttock pain 02/16/2016   NSVD (normal spontaneous vaginal delivery) 01/06/2016   Preeclampsia 01/05/2016   Allergic rhinitis 09/22/2015   Abnormal MSAFP (maternal serum alpha-fetoprotein), elevated 09/01/2015   Migraine without aura, without mention of intractable migraine without mention of status migrainosus 06/05/2013   Deviated nasal septum 02/18/2013   Asthma 02/22/2010   Vitamin D deficiency 11/04/2009   Headache 07/16/2009   Abnormal weight gain 07/02/2009  Exercise induced bronchospasm 04/01/2009   Obesity, unspecified 04/01/2009   Herpes simplex virus (HSV) infection 03/23/2009    Allergies:  Allergies  Allergen Reactions   Coconut (Cocos Nucifera) Hives    ANYTHING WITH COCONUT IN IT   [facility] [Mometasone Furo-Formoterol Fum]     Patient  can only take advair   Ciprofloxacin Hives and Rash   Hydrocortisone Hives and Rash   Medications:  Current Outpatient Medications:    albuterol (PROAIR HFA) 108 (90 Base) MCG/ACT inhaler, Inhale 2 puffs into the lungs every 4 (four) hours as needed for wheezing or shortness of breath., Disp: 8 g, Rfl: 2   Ascorbic Acid (VITAMIN C) 1000 MG tablet, Take 500 mg by mouth daily. , Disp: , Rfl:    Bacillus Coagulans-Inulin (PROBIOTIC-PREBIOTIC PO), Take 2 tablets by mouth daily., Disp: , Rfl:    benzonatate (TESSALON) 200 MG capsule, Take 1 capsule (200 mg total) by mouth 2 (two) times daily as needed for cough., Disp: 20 capsule, Rfl: 0   Cholecalciferol (VITAMIN D3) 125 MCG (5000 UT) CAPS, Take 1 capsule by mouth daily., Disp: , Rfl:    Cholecalciferol 50 MCG (2000 UT) CAPS, Take by mouth., Disp: , Rfl:    fluconazole (DIFLUCAN) 150 MG tablet, Take 1 tablet (150 mg total) by mouth daily. (Patient not taking: Reported on 03/16/2021), Disp: 2 tablet, Rfl: 0   fluticasone (FLONASE) 50 MCG/ACT nasal spray, Place 2 sprays into both nostrils daily., Disp: 16 g, Rfl: 0   fluticasone-salmeterol (ADVAIR) 100-50 MCG/ACT AEPB, Inhale 1 puff into the lungs 2 (two) times daily., Disp: 1 each, Rfl: 3   levocetirizine (XYZAL) 5 MG tablet, Take 5 mg by mouth every evening., Disp: , Rfl:    Loratadine (CLARITIN PO), Take by mouth. (Patient not taking: Reported on 08/24/2021), Disp: , Rfl:    Lysine 500 MG CAPS, Take 1,000 mg by mouth daily. , Disp: , Rfl:    Magnesium 400 MG CAPS, Take by mouth., Disp: , Rfl:    montelukast (SINGULAIR) 10 MG tablet, TAKE 1 TABLET BY MOUTH ONCE DAILY, Disp: 90 tablet, Rfl: 3   Multiple Vitamin (MULTIVITAMIN) tablet, Take 1 tablet by mouth daily., Disp: , Rfl:    nitrofurantoin, macrocrystal-monohydrate, (MACROBID) 100 MG capsule, Take 1 capsule (100 mg total) by mouth 2 (two) times daily. (Patient not taking: Reported on 08/24/2021), Disp: 14 capsule, Rfl: 0   Omega-3 Fatty Acids (FISH  OIL) 1000 MG CAPS, Take 1,000 mg by mouth daily., Disp: , Rfl:    omeprazole (PRILOSEC) 20 MG capsule, Take 1 capsule (20 mg total) by mouth daily., Disp: 90 capsule, Rfl: 3   predniSONE (STERAPRED UNI-PAK 21 TAB) 10 MG (21) TBPK tablet, Take 6 tablets by mouth today then 5 tablets tomorrow then one less tablet everyday thereafter. Take with food. (Patient not taking: Reported on 03/16/2021), Disp: 21 tablet, Rfl: 0   Pyridoxine HCl (B-6) 100 MG TABS, Take 100 mg by mouth daily., Disp: , Rfl:    UNABLE TO FIND, Med Name: Energy Blend - Vit B12 300mg , COQ-10 30mg , Goj Berry, Disp: , Rfl:    UNABLE TO FIND, ed Name: Copaiba 120mg , Disp: , Rfl:    UNABLE TO FIND, Med Name: Zendourine  (supplement), Disp: , Rfl:    UNABLE TO FIND, Med Name: Durenda Hurt (supplement), Disp: , Rfl:    valACYclovir (VALTREX) 1000 MG tablet, Take 1 tablet (1,000 mg total) by mouth 2 (two) times daily as needed., Disp: 10 tablet, Rfl: 1  Observations/Objective:  Patient is well-developed, well-nourished in no acute distress.  Resting comfortably at home.  Speech is clear and coherent with logical content.     Assessment and Plan: 1. Moderate persistent asthma without complication - fluticasone-salmeterol (ADVAIR) 100-50 MCG/ACT AEPB; Inhale 1 puff into the lungs 2 (two) times daily.  Dispense: 1 each; Refill: 3 - albuterol (PROAIR HFA) 108 (90 Base) MCG/ACT inhaler; Inhale 2 puffs into the lungs every 4 (four) hours as needed for wheezing or shortness of breath.  Dispense: 8 g; Refill: 2  Take medicine as prescribed. Follow up with PCP as scheduled later this year.   Follow Up Instructions: I discussed the assessment and treatment plan with the patient. The patient was provided an opportunity to ask questions and all were answered. The patient agreed with the plan and demonstrated an understanding of the instructions.  A copy of instructions were sent to the patient via MyChart unless otherwise noted below.    The  patient was advised to call back or seek an in-person evaluation if the symptoms worsen or if the condition fails to improve as anticipated.  Time:  I spent 10 minutes with the patient via telehealth technology discussing the above problems/concerns.    Gilberto Better, PA-C

## 2022-10-04 DIAGNOSIS — S63641D Sprain of metacarpophalangeal joint of right thumb, subsequent encounter: Secondary | ICD-10-CM | POA: Diagnosis not present

## 2022-10-05 DIAGNOSIS — M9901 Segmental and somatic dysfunction of cervical region: Secondary | ICD-10-CM | POA: Diagnosis not present

## 2022-10-05 DIAGNOSIS — M9902 Segmental and somatic dysfunction of thoracic region: Secondary | ICD-10-CM | POA: Diagnosis not present

## 2022-10-05 DIAGNOSIS — M531 Cervicobrachial syndrome: Secondary | ICD-10-CM | POA: Diagnosis not present

## 2022-10-05 DIAGNOSIS — M530 Cervicocranial syndrome: Secondary | ICD-10-CM | POA: Diagnosis not present

## 2022-11-01 DIAGNOSIS — Z713 Dietary counseling and surveillance: Secondary | ICD-10-CM | POA: Diagnosis not present

## 2022-11-02 DIAGNOSIS — M9901 Segmental and somatic dysfunction of cervical region: Secondary | ICD-10-CM | POA: Diagnosis not present

## 2022-11-02 DIAGNOSIS — M531 Cervicobrachial syndrome: Secondary | ICD-10-CM | POA: Diagnosis not present

## 2022-11-02 DIAGNOSIS — M9902 Segmental and somatic dysfunction of thoracic region: Secondary | ICD-10-CM | POA: Diagnosis not present

## 2022-11-02 DIAGNOSIS — M530 Cervicocranial syndrome: Secondary | ICD-10-CM | POA: Diagnosis not present

## 2022-11-03 DIAGNOSIS — D509 Iron deficiency anemia, unspecified: Secondary | ICD-10-CM | POA: Diagnosis not present

## 2022-11-03 DIAGNOSIS — N926 Irregular menstruation, unspecified: Secondary | ICD-10-CM | POA: Diagnosis not present

## 2022-11-03 DIAGNOSIS — N920 Excessive and frequent menstruation with regular cycle: Secondary | ICD-10-CM | POA: Diagnosis not present

## 2022-11-30 DIAGNOSIS — M530 Cervicocranial syndrome: Secondary | ICD-10-CM | POA: Diagnosis not present

## 2022-11-30 DIAGNOSIS — M531 Cervicobrachial syndrome: Secondary | ICD-10-CM | POA: Diagnosis not present

## 2022-11-30 DIAGNOSIS — M9902 Segmental and somatic dysfunction of thoracic region: Secondary | ICD-10-CM | POA: Diagnosis not present

## 2022-11-30 DIAGNOSIS — M9901 Segmental and somatic dysfunction of cervical region: Secondary | ICD-10-CM | POA: Diagnosis not present

## 2022-12-07 ENCOUNTER — Other Ambulatory Visit: Payer: Self-pay | Admitting: Obstetrics and Gynecology

## 2022-12-07 DIAGNOSIS — E041 Nontoxic single thyroid nodule: Secondary | ICD-10-CM

## 2022-12-07 DIAGNOSIS — Z6841 Body Mass Index (BMI) 40.0 and over, adult: Secondary | ICD-10-CM | POA: Diagnosis not present

## 2022-12-07 DIAGNOSIS — Z01419 Encounter for gynecological examination (general) (routine) without abnormal findings: Secondary | ICD-10-CM | POA: Diagnosis not present

## 2022-12-07 DIAGNOSIS — Z1231 Encounter for screening mammogram for malignant neoplasm of breast: Secondary | ICD-10-CM | POA: Diagnosis not present

## 2022-12-13 ENCOUNTER — Ambulatory Visit
Admission: RE | Admit: 2022-12-13 | Discharge: 2022-12-13 | Disposition: A | Discharge status: Home / Self Care | Payer: BC Managed Care – PPO | Source: Ambulatory Visit | Attending: Obstetrics and Gynecology | Admitting: Obstetrics and Gynecology

## 2022-12-13 DIAGNOSIS — E042 Nontoxic multinodular goiter: Secondary | ICD-10-CM | POA: Diagnosis not present

## 2022-12-13 DIAGNOSIS — E041 Nontoxic single thyroid nodule: Secondary | ICD-10-CM

## 2022-12-20 DIAGNOSIS — Z13220 Encounter for screening for lipoid disorders: Secondary | ICD-10-CM | POA: Diagnosis not present

## 2022-12-20 DIAGNOSIS — J069 Acute upper respiratory infection, unspecified: Secondary | ICD-10-CM | POA: Diagnosis not present

## 2022-12-20 DIAGNOSIS — Z1331 Encounter for screening for depression: Secondary | ICD-10-CM | POA: Diagnosis not present

## 2022-12-20 DIAGNOSIS — Z0000 Encounter for general adult medical examination without abnormal findings: Secondary | ICD-10-CM | POA: Diagnosis not present

## 2022-12-20 DIAGNOSIS — J309 Allergic rhinitis, unspecified: Secondary | ICD-10-CM | POA: Diagnosis not present

## 2022-12-20 DIAGNOSIS — K219 Gastro-esophageal reflux disease without esophagitis: Secondary | ICD-10-CM | POA: Diagnosis not present

## 2022-12-20 DIAGNOSIS — Z130 Encounter for screening for diseases of the blood and blood-forming organs and certain disorders involving the immune mechanism: Secondary | ICD-10-CM | POA: Diagnosis not present

## 2022-12-20 DIAGNOSIS — Z131 Encounter for screening for diabetes mellitus: Secondary | ICD-10-CM | POA: Diagnosis not present

## 2022-12-25 DIAGNOSIS — R7309 Other abnormal glucose: Secondary | ICD-10-CM | POA: Diagnosis not present

## 2023-01-03 DIAGNOSIS — M531 Cervicobrachial syndrome: Secondary | ICD-10-CM | POA: Diagnosis not present

## 2023-01-03 DIAGNOSIS — M9902 Segmental and somatic dysfunction of thoracic region: Secondary | ICD-10-CM | POA: Diagnosis not present

## 2023-01-03 DIAGNOSIS — M530 Cervicocranial syndrome: Secondary | ICD-10-CM | POA: Diagnosis not present

## 2023-01-03 DIAGNOSIS — M9901 Segmental and somatic dysfunction of cervical region: Secondary | ICD-10-CM | POA: Diagnosis not present

## 2023-01-25 DIAGNOSIS — R7309 Other abnormal glucose: Secondary | ICD-10-CM | POA: Diagnosis not present

## 2023-02-21 DIAGNOSIS — M530 Cervicocranial syndrome: Secondary | ICD-10-CM | POA: Diagnosis not present

## 2023-02-21 DIAGNOSIS — M9901 Segmental and somatic dysfunction of cervical region: Secondary | ICD-10-CM | POA: Diagnosis not present

## 2023-02-21 DIAGNOSIS — M9902 Segmental and somatic dysfunction of thoracic region: Secondary | ICD-10-CM | POA: Diagnosis not present

## 2023-02-21 DIAGNOSIS — M531 Cervicobrachial syndrome: Secondary | ICD-10-CM | POA: Diagnosis not present

## 2023-02-22 DIAGNOSIS — Z6841 Body Mass Index (BMI) 40.0 and over, adult: Secondary | ICD-10-CM | POA: Diagnosis not present

## 2023-02-25 DIAGNOSIS — R7309 Other abnormal glucose: Secondary | ICD-10-CM | POA: Diagnosis not present

## 2023-03-02 ENCOUNTER — Other Ambulatory Visit: Payer: Self-pay | Admitting: Adult Health

## 2023-03-02 MED ORDER — FLUTICASONE-SALMETEROL 115-21 MCG/ACT IN AERO: 2.0000 | INHALATION_SPRAY | Freq: Two times a day (BID) | RESPIRATORY_TRACT | 12 refills | Status: AC

## 2023-03-02 MED ORDER — FLUTICASONE-SALMETEROL 115-21 MCG/ACT IN AERO: 2.0000 | INHALATION_SPRAY | Freq: Two times a day (BID) | RESPIRATORY_TRACT | 12 refills | Status: DC

## 2023-03-06 DIAGNOSIS — J0100 Acute maxillary sinusitis, unspecified: Secondary | ICD-10-CM | POA: Diagnosis not present

## 2023-03-09 ENCOUNTER — Telehealth: Payer: BC Managed Care – PPO | Admitting: Physician Assistant

## 2023-03-09 DIAGNOSIS — T3695XA Adverse effect of unspecified systemic antibiotic, initial encounter: Secondary | ICD-10-CM

## 2023-03-09 DIAGNOSIS — B379 Candidiasis, unspecified: Secondary | ICD-10-CM | POA: Diagnosis not present

## 2023-03-09 MED ORDER — FLUCONAZOLE 150 MG PO TABS
ORAL_TABLET | ORAL | 0 refills | Status: DC
Start: 2023-03-09 — End: 2023-03-14

## 2023-03-09 NOTE — Progress Notes (Signed)

## 2023-03-09 NOTE — Progress Notes (Signed)
I have spent 5 minutes in review of e-visit questionnaire, review and updating patient chart, medical decision making and response to patient.   Piedad Climes, PA-C

## 2023-03-14 ENCOUNTER — Other Ambulatory Visit: Payer: Self-pay

## 2023-03-14 ENCOUNTER — Ambulatory Visit (INDEPENDENT_AMBULATORY_CARE_PROVIDER_SITE_OTHER): Payer: Self-pay | Admitting: Oncology

## 2023-03-14 ENCOUNTER — Encounter: Payer: Self-pay | Admitting: Oncology

## 2023-03-14 VITALS — BP 126/74 | HR 87 | Temp 97.2°F | Ht 68.0 in | Wt 259.0 lb

## 2023-03-14 DIAGNOSIS — R051 Acute cough: Secondary | ICD-10-CM

## 2023-03-14 MED ORDER — PREDNISONE 10 MG (21) PO TBPK
ORAL_TABLET | ORAL | 0 refills | Status: DC
Start: 2023-03-14 — End: 2023-03-20

## 2023-03-14 NOTE — Progress Notes (Signed)
[facility] [address] [place], [place] [address] Phone: [phone] Fax: [phone]   Office Visit Note  Patient Name: Sonia Martinez  Date of [id number]  Med Rec number [id number]  Date of Service: 03/14/2023  Coconut (cocos nucifera), Dulera [mometasone furo-formoterol fum], Ciprofloxacin, and Hydrocortisone  Chief Complaint  Patient presents with   Cough    Patient c/o productive cough with sputum production and wheezing x 2 weeks. Cough is worse in the mornings and at night. She was recently treated with Augmentin for a sinus infection but states her symptoms are still persisting. She has a rescue inhaler which she has not needed to use. Her daughter had the flu and strept throat about 2 weeks ago.     HPI  Patient is a 43 year old who is here for concerns of a cough.  She is currently being treated for a sinus infection with Augmentin for 10 days.  Her last dose will be tomorrow.  Initial symptoms started about 2 weeks ago when her daughter came home with the flu.  Reports symptoms of sinus congestion and pressure along with postnasal drainage.  Over the past 3 to 4 days, she has developed increased coughing especially at bedtime.  She has history of asthma and uses Advair daily along with albuterol inhaler for shortness of breath.  She has not used her albuterol but uses her Advair daily.  Has some associated sore throat.  She is currently using throat lozenges and drinking warm liquids.  She used Mucinex for a few days but is no longer using.  Last night when she was sleeping, she felt like she was hearing rattling in her chest.  Wanted to make sure it was not turning into pneumonia.  No fevers.  Current Medication:  Outpatient Encounter Medications as of 03/14/2023  Medication Sig   albuterol (PROAIR HFA) 108 (90 Base) MCG/ACT inhaler Inhale 2 puffs into the lungs every 4 (four) hours as needed for wheezing or shortness of breath.   Ascorbic Acid (VITAMIN C)  1000 MG tablet Take 500 mg by mouth daily.    Bacillus Coagulans-Inulin (PROBIOTIC-PREBIOTIC PO) Take 2 tablets by mouth daily.   buPROPion (WELLBUTRIN) 75 MG tablet Take 1 tablet by mouth daily.   Cholecalciferol (VITAMIN D3) 125 MCG (5000 UT) CAPS Take 1 capsule by mouth daily.   fluticasone (FLONASE) 50 MCG/ACT nasal spray Place 2 sprays into both nostrils daily.   fluticasone-salmeterol (ADVAIR HFA) 115-21 MCG/ACT inhaler Inhale 2 puffs into the lungs 2 (two) times daily.   levocetirizine (XYZAL) 5 MG tablet Take 5 mg by mouth every evening.   LO LOESTRIN FE 1 MG-10 MCG / 10 MCG tablet Take 1 tablet by mouth daily.   Lysine 500 MG CAPS Take 1,000 mg by mouth daily.    Magnesium 400 MG CAPS Take by mouth.   montelukast (SINGULAIR) 10 MG tablet TAKE 1 TABLET BY MOUTH ONCE DAILY   Multiple Vitamin (MULTIVITAMIN) tablet Take 1 tablet by mouth daily.   Omega-3 Fatty Acids (FISH OIL) 1000 MG CAPS Take 1,000 mg by mouth daily.   omeprazole (PRILOSEC) 20 MG capsule Take 1 capsule (20 mg total) by mouth daily.   predniSONE (STERAPRED UNI-PAK 21 TAB) 10 MG (21) TBPK tablet Take as directed.   spironolactone (ALDACTONE) 50 MG tablet Take 50 mg by mouth 2 (two) times daily.   valACYclovir (VALTREX) 1000 MG tablet Take 1 tablet (1,000 mg total) by mouth 2 (two) times daily as needed.   benzonatate (  TESSALON) 200 MG capsule Take 1 capsule (200 mg total) by mouth 2 (two) times daily as needed for cough. (Patient not taking: Reported on 03/14/2023)   [DISCONTINUED] Cholecalciferol 50 MCG (2000 UT) CAPS Take by mouth.   [DISCONTINUED] fluconazole (DIFLUCAN) 150 MG tablet Take 1 tablet PO once. Repeat in 3 days if needed.   [DISCONTINUED] Loratadine (CLARITIN PO) Take by mouth. (Patient not taking: Reported on 08/24/2021)   [DISCONTINUED] Pyridoxine HCl (B-6) 100 MG TABS Take 100 mg by mouth daily.   [DISCONTINUED] UNABLE TO FIND Med Name: Energy Blend - Vit B12 300mg , COQ-10 30mg , Goj Berry   [DISCONTINUED]  UNABLE TO FIND ed Name: Copaiba 120mg    [DISCONTINUED] UNABLE TO FIND Med Name: Zendourine  (supplement)   [DISCONTINUED] UNABLE TO FIND Med Name: Durenda Hurt (supplement)   No facility-administered encounter medications on file as of 03/14/2023.      Medical History: Past Medical History:  Diagnosis Date   Asthma    WELL CONTROLLED   Fatty liver    Folliculitis 03/31/2016   GERD (gastroesophageal reflux disease)    Herpes simplex virus (HSV) infection 03/23/2009   Overview:  Herpes Simplex  10/1 IMO update   History of methicillin resistant staphylococcus aureus (MRSA) 2013   + PCR SCREEN   IBS (irritable bowel syndrome)    Migraine without aura, without mention of intractable migraine without mention of status migrainosus 06/05/2013   Obesity    Oligohydramnios    2013   Pre-eclampsia    2013   Preeclampsia 01/05/2016   Pregnancy induced hypertension    Wrist joint pain 01/09/2017     Vital Signs: BP 126/74   Pulse 87   Temp (!) 97.2 F (36.2 C)   Ht 5\' 8"  (1.727 m)   Wt 259 lb (117.5 kg)   SpO2 97%   BMI 39.38 kg/m   ROS: As per HPI.  All other pertinent ROS negative.     Review of Systems  Constitutional:  Positive for fatigue.  HENT:  Positive for congestion, postnasal drip, sinus pressure, sinus pain and sore throat.   Respiratory:  Positive for cough and wheezing.     Physical Exam Constitutional:      Appearance: Normal appearance.  HENT:     Right Ear: Tympanic membrane normal.     Left Ear: Tympanic membrane normal.     Nose: Mucosal edema present. No congestion or rhinorrhea.     Right Turbinates: Not swollen.     Left Turbinates: Not swollen.     Right Sinus: Maxillary sinus tenderness present. No frontal sinus tenderness.     Left Sinus: Maxillary sinus tenderness present. No frontal sinus tenderness.     Mouth/Throat:     Lips: Pink.     Mouth: Mucous membranes are moist.     Pharynx: Postnasal drip present.     Tonsils: No tonsillar exudate. 0  on the left.  Pulmonary:     Effort: Pulmonary effort is normal. Prolonged expiration present.     Breath sounds: Decreased air movement present. Examination of the right-lower field reveals decreased breath sounds. Examination of the left-lower field reveals decreased breath sounds. Decreased breath sounds present.  Neurological:     Mental Status: She is alert.     No results found for this or any previous visit (from the past 24 hours).  Assessment/Plan: 1. Acute cough (Primary) Exam reveals some delayed expiration concerning for inflammation.  Given history of asthma, would recommend prednisone taper over the next  6 days.  Patient has had prednisone in the past and tolerated well.  She knows to take with food.  Take 6 tabs on day 1 followed by 5 tablets on day 2 and decrease by 1 tablet daily until complete.  Finish Augmentin for sinus infection.  If no improvement with cough by Friday, would recommend chest x-ray and reevaluation.  She may also take a cough suppressant and add in a decongestant such as DayQuil/NyQuil to help with her symptoms.  - predniSONE (STERAPRED UNI-PAK 21 TAB) 10 MG (21) TBPK tablet; Take as directed.  Dispense: 21 tablet; Refill: 0   General Counseling: Kikuye verbalizes understanding of the findings of todays visit and agrees with plan of treatment. I have discussed any further diagnostic evaluation that may be needed or ordered today. We also reviewed her medications today. she has been encouraged to call the office with any questions or concerns that should arise related to todays visit.   No orders of the defined types were placed in this encounter.   Meds ordered this encounter  Medications   predniSONE (STERAPRED UNI-PAK 21 TAB) 10 MG (21) TBPK tablet    Sig: Take as directed.    Dispense:  21 tablet    Refill:  0    I spent 20 minutes dedicated to the care of this patient (face-to-face and non-face-to-face) on the date of the encounter to include  what is described in the assessment and plan.   Durenda Hurt, NP 03/14/2023 11:01 AM

## 2023-03-20 ENCOUNTER — Ambulatory Visit (INDEPENDENT_AMBULATORY_CARE_PROVIDER_SITE_OTHER): Payer: Self-pay | Admitting: Oncology

## 2023-03-20 ENCOUNTER — Other Ambulatory Visit: Payer: Self-pay

## 2023-03-20 ENCOUNTER — Encounter: Payer: Self-pay | Admitting: Oncology

## 2023-03-20 VITALS — BP 122/70 | HR 76 | Temp 97.4°F | Ht 68.0 in

## 2023-03-20 DIAGNOSIS — N760 Acute vaginitis: Secondary | ICD-10-CM

## 2023-03-20 DIAGNOSIS — R051 Acute cough: Secondary | ICD-10-CM

## 2023-03-20 DIAGNOSIS — J029 Acute pharyngitis, unspecified: Secondary | ICD-10-CM

## 2023-03-20 LAB — POCT RAPID STREP A (OFFICE): Rapid Strep A Screen: NEGATIVE

## 2023-03-20 MED ORDER — AZELASTINE-FLUTICASONE 137-50 MCG/ACT NA SUSP: 1.0000 | Freq: Two times a day (BID) | NASAL | 0 refills | Status: DC

## 2023-03-20 MED ORDER — FLUCONAZOLE 150 MG PO TABS: 150.0000 mg | ORAL_TABLET | ORAL | 0 refills | Status: DC | PRN

## 2023-03-20 MED ORDER — AZITHROMYCIN 250 MG PO TABS: ORAL_TABLET | ORAL | 0 refills | Status: AC

## 2023-03-20 NOTE — Progress Notes (Signed)
 [facility] [address] [place], [place] [address] Phone: [phone] Fax: [phone]   Office Visit Note  Patient Name: Sonia Martinez  Date of ONGEX:[id number]  Med Rec number [id number]  Date of Service: 03/20/2023  Coconut (cocos nucifera), Dulera [mometasone furo-formoterol fum], Ciprofloxacin, and Hydrocortisone  Chief Complaint  Patient presents with   Follow-up    Cough is worse at night and has made her throat feel raw. She completed prednisone yesterday.    HPI Patient is an 43 y.o. patient here for complaints of persistent cough and new onset ST. She was treated for a sinus infection with Augmentin which she finished last week with improvement in her facial tenderness and pain but increased coughing.  She was put on prednisone x 7 days which she finished yesterday.  Reports over the weekend, she developed a sore throat and cough returned worse at bedtime.  Reports her daughter had flu and strep a few weeks ago.  She has been using Tylenol and throat lozenges which have been somewhat helpful.  Has history of severe sinus infections but had a sinus surgery about 10 years ago which has significantly helped her getting recurrent infections.  She takes Singulair, Zyrtec and uses Flonase daily.  She also has history of asthma and uses Advair and albuterol inhaler for shortness of breath.  She is followed closely by ENT although she has not seen them recently.  Reports she was feeling much better on Thursday and Friday of last week with worsening of symptoms over the weekend.  No fevers.  Current Medication:  Outpatient Encounter Medications as of 03/20/2023  Medication Sig   albuterol (PROAIR HFA) 108 (90 Base) MCG/ACT inhaler Inhale 2 puffs into the lungs every 4 (four) hours as needed for wheezing or shortness of breath.   Ascorbic Acid (VITAMIN C) 1000 MG tablet Take 500 mg by mouth daily.    Azelastine-Fluticasone 137-50 MCG/ACT SUSP Place 1 spray into the  nose every 12 (twelve) hours.   azithromycin (ZITHROMAX) 250 MG tablet Take 2 tablets on day 1, then 1 tablet daily on days 2 through 5   Bacillus Coagulans-Inulin (PROBIOTIC-PREBIOTIC PO) Take 2 tablets by mouth daily.   buPROPion (WELLBUTRIN) 75 MG tablet Take 1 tablet by mouth daily.   Cholecalciferol (VITAMIN D3) 125 MCG (5000 UT) CAPS Take 1 capsule by mouth daily.   fluconazole (DIFLUCAN) 150 MG tablet Take 1 tablet (150 mg total) by mouth every three (3) days as needed.   fluticasone (FLONASE) 50 MCG/ACT nasal spray Place 2 sprays into both nostrils daily.   fluticasone-salmeterol (ADVAIR HFA) 115-21 MCG/ACT inhaler Inhale 2 puffs into the lungs 2 (two) times daily.   levocetirizine (XYZAL) 5 MG tablet Take 5 mg by mouth every evening.   LO LOESTRIN FE 1 MG-10 MCG / 10 MCG tablet Take 1 tablet by mouth daily.   Lysine 500 MG CAPS Take 1,000 mg by mouth daily.    Magnesium 400 MG CAPS Take by mouth.   montelukast (SINGULAIR) 10 MG tablet TAKE 1 TABLET BY MOUTH ONCE DAILY   Multiple Vitamin (MULTIVITAMIN) tablet Take 1 tablet by mouth daily.   Omega-3 Fatty Acids (FISH OIL) 1000 MG CAPS Take 1,000 mg by mouth daily.   omeprazole (PRILOSEC) 20 MG capsule Take 1 capsule (20 mg total) by mouth daily.   spironolactone (ALDACTONE) 50 MG tablet Take 50 mg by mouth 2 (two) times daily.   valACYclovir (VALTREX) 1000 MG tablet Take 1 tablet (1,000 mg total)  by mouth 2 (two) times daily as needed.   [DISCONTINUED] benzonatate (TESSALON) 200 MG capsule Take 1 capsule (200 mg total) by mouth 2 (two) times daily as needed for cough.   [DISCONTINUED] predniSONE (STERAPRED UNI-PAK 21 TAB) 10 MG (21) TBPK tablet Take as directed.   No facility-administered encounter medications on file as of 03/20/2023.      Medical History: Past Medical History:  Diagnosis Date   Asthma    WELL CONTROLLED   Fatty liver    Folliculitis 03/31/2016   GERD (gastroesophageal reflux disease)    Herpes simplex virus  (HSV) infection 03/23/2009   Overview:  Herpes Simplex  10/1 IMO update   History of methicillin resistant staphylococcus aureus (MRSA) 2013   + PCR SCREEN   IBS (irritable bowel syndrome)    Migraine without aura, without mention of intractable migraine without mention of status migrainosus 06/05/2013   Obesity    Oligohydramnios    2013   Pre-eclampsia    2013   Preeclampsia 01/05/2016   Pregnancy induced hypertension    Wrist joint pain 01/09/2017     Vital Signs: BP 122/70   Pulse 76   Temp (!) 97.4 F (36.3 C)   Ht 5\' 8"  (1.727 m)   SpO2 100%   BMI 39.38 kg/m   ROS: As per HPI.  All other pertinent ROS negative.     Review of Systems  Constitutional:  Positive for fatigue.  HENT:  Positive for postnasal drip, sinus pressure, sinus pain and sore throat.   Respiratory:  Positive for cough and shortness of breath.   Psychiatric/Behavioral:  Positive for sleep disturbance.     Physical Exam Constitutional:      Appearance: Normal appearance.  HENT:     Right Ear: A middle ear effusion is present.     Left Ear: A middle ear effusion is present.     Nose: Mucosal edema, congestion and rhinorrhea present.     Right Turbinates: Not swollen.     Left Turbinates: Not swollen.     Right Sinus: Maxillary sinus tenderness present. No frontal sinus tenderness.     Left Sinus: Maxillary sinus tenderness present. No frontal sinus tenderness.     Mouth/Throat:     Lips: Pink.     Pharynx: Posterior oropharyngeal erythema and postnasal drip present.     Tonsils: No tonsillar exudate. 0 on the right. 0 on the left.  Cardiovascular:     Rate and Rhythm: Normal rate and regular rhythm.  Pulmonary:     Breath sounds: Normal breath sounds.  Neurological:     Mental Status: She is alert.     No results found for this or any previous visit (from the past 24 hours).  Assessment/Plan: 1. Acute cough (Primary) Respiratory exam benign.  Just finished prednisone taper.  No need  for chest x-ray at this time.  If symptoms worsen, would recommend chest x-ray.  We discussed cough likely secondary to postnasal drip.  Discussed adding azelastine nasal spray to see if that helps with postnasal drip.  Continue antihistamines and Singulair.  Follow-up with ENT.  - DG Chest 2 View; Future - azithromycin (ZITHROMAX) 250 MG tablet; Take 2 tablets on day 1, then 1 tablet daily on days 2 through 5  Dispense: 6 tablet; Refill: 0  2. Sore throat Strep a was negative.  Throat is slightly swollen and red.  We discussed possibly due to postnasal drip versus others.  Daughter had strep a couple of  weeks ago.  Has already been treated for sinus infection with Augmentin and prednisone for lung inflammation.  We discussed azithromycin 250 mg tablets.  Take 2 on day 1 followed by 1 tablet until complete.  If no improvement, would recommend follow-up with ENT given history and previous sinus surgery.  - POCT rapid strep A - azithromycin (ZITHROMAX) 250 MG tablet; Take 2 tablets on day 1, then 1 tablet daily on days 2 through 5  Dispense: 6 tablet; Refill: 0 - Azelastine-Fluticasone 137-50 MCG/ACT SUSP; Place 1 spray into the nose every 12 (twelve) hours.  Dispense: 23 g; Refill: 0  3. Acute vaginitis Prophylactic Diflucan 150 mg tablets.  Take 1 for concerns of vaginitis and may repeat every 3 days as needed.  - fluconazole (DIFLUCAN) 150 MG tablet; Take 1 tablet (150 mg total) by mouth every three (3) days as needed.  Dispense: 3 tablet; Refill: 0   General Counseling: Azucena verbalizes understanding of the findings of todays visit and agrees with plan of treatment. I have discussed any further diagnostic evaluation that may be needed or ordered today. We also reviewed her medications today. she has been encouraged to call the office with any questions or concerns that should arise related to todays visit.   Orders Placed This Encounter  Procedures   DG Chest 2 View   POCT rapid strep A     Meds ordered this encounter  Medications   azithromycin (ZITHROMAX) 250 MG tablet    Sig: Take 2 tablets on day 1, then 1 tablet daily on days 2 through 5    Dispense:  6 tablet    Refill:  0   fluconazole (DIFLUCAN) 150 MG tablet    Sig: Take 1 tablet (150 mg total) by mouth every three (3) days as needed.    Dispense:  3 tablet    Refill:  0   Azelastine-Fluticasone 137-50 MCG/ACT SUSP    Sig: Place 1 spray into the nose every 12 (twelve) hours.    Dispense:  23 g    Refill:  0    I spent 25 minutes dedicated to the care of this patient (face-to-face and non-face-to-face) on the date of the encounter to include what is described in the assessment and plan.   Durenda Hurt, NP 03/20/2023 8:59 AM

## 2023-03-25 DIAGNOSIS — R7309 Other abnormal glucose: Secondary | ICD-10-CM | POA: Diagnosis not present

## 2023-03-28 ENCOUNTER — Ambulatory Visit (INDEPENDENT_AMBULATORY_CARE_PROVIDER_SITE_OTHER): Payer: Self-pay | Admitting: Oncology

## 2023-03-28 ENCOUNTER — Other Ambulatory Visit: Payer: Self-pay

## 2023-03-28 ENCOUNTER — Encounter: Payer: Self-pay | Admitting: Oncology

## 2023-03-28 ENCOUNTER — Ambulatory Visit
Admission: RE | Admit: 2023-03-28 | Discharge: 2023-03-28 | Disposition: A | Discharge status: Home / Self Care | Attending: Oncology | Admitting: Oncology

## 2023-03-28 ENCOUNTER — Ambulatory Visit
Admission: RE | Admit: 2023-03-28 | Discharge: 2023-03-28 | Disposition: A | Discharge status: Home / Self Care | Source: Ambulatory Visit | Attending: Oncology | Admitting: Oncology

## 2023-03-28 VITALS — BP 124/72 | HR 86 | Temp 97.3°F | Ht 68.0 in

## 2023-03-28 DIAGNOSIS — R051 Acute cough: Secondary | ICD-10-CM

## 2023-03-28 DIAGNOSIS — R059 Cough, unspecified: Secondary | ICD-10-CM | POA: Diagnosis not present

## 2023-03-28 NOTE — Progress Notes (Signed)
 [facility]  [address] [place], [place] [address] Phone: [phone] Fax: [phone]   Office Visit Note  Patient Name: Sonia Martinez  Date of MVHQI:[id number]  Med Rec number [id number]  Date of Service: 03/28/2023  Coconut (cocos nucifera), Dulera [mometasone furo-formoterol fum], Ciprofloxacin, and Hydrocortisone  No chief complaint on file.  HPI Patient is an 43 y.o. female who presents for persistent cough.  She has been seen twice for similar symptoms and once virtually.  She has been on Augmentin, azithromycin and prednisone.  She has history of asthma and uses her maintenance inhaler Advair twice daily and albuterol as needed.  Reports today, she needed to use her albuterol inhaler on 2 separate occasions due to coughing attacks.  Cough is now productive with yellow/clear phlegm.  Reports no other sick contacts in her home.  Having trouble taking deep breaths.  No fevers.  Some shortness of breath with exertion.  Current Medication:  Outpatient Encounter Medications as of 03/28/2023  Medication Sig   albuterol (PROAIR HFA) 108 (90 Base) MCG/ACT inhaler Inhale 2 puffs into the lungs every 4 (four) hours as needed for wheezing or shortness of breath.   Ascorbic Acid (VITAMIN C) 1000 MG tablet Take 500 mg by mouth daily.    Azelastine-Fluticasone 137-50 MCG/ACT SUSP Place 1 spray into the nose every 12 (twelve) hours.   Bacillus Coagulans-Inulin (PROBIOTIC-PREBIOTIC PO) Take 2 tablets by mouth daily.   buPROPion (WELLBUTRIN) 75 MG tablet Take 1 tablet by mouth daily.   Cholecalciferol (VITAMIN D3) 125 MCG (5000 UT) CAPS Take 1 capsule by mouth daily.   fluconazole (DIFLUCAN) 150 MG tablet Take 1 tablet (150 mg total) by mouth every three (3) days as needed.   fluticasone (FLONASE) 50 MCG/ACT nasal spray Place 2 sprays into both nostrils daily.   fluticasone-salmeterol (ADVAIR HFA) 115-21 MCG/ACT inhaler Inhale 2 puffs into the lungs 2 (two) times daily.    levocetirizine (XYZAL) 5 MG tablet Take 5 mg by mouth every evening.   LO LOESTRIN FE 1 MG-10 MCG / 10 MCG tablet Take 1 tablet by mouth daily.   Lysine 500 MG CAPS Take 1,000 mg by mouth daily.    Magnesium 400 MG CAPS Take by mouth.   montelukast (SINGULAIR) 10 MG tablet TAKE 1 TABLET BY MOUTH ONCE DAILY   Multiple Vitamin (MULTIVITAMIN) tablet Take 1 tablet by mouth daily.   Omega-3 Fatty Acids (FISH OIL) 1000 MG CAPS Take 1,000 mg by mouth daily.   omeprazole (PRILOSEC) 20 MG capsule Take 1 capsule (20 mg total) by mouth daily.   spironolactone (ALDACTONE) 50 MG tablet Take 50 mg by mouth 2 (two) times daily.   valACYclovir (VALTREX) 1000 MG tablet Take 1 tablet (1,000 mg total) by mouth 2 (two) times daily as needed.   No facility-administered encounter medications on file as of 03/28/2023.      Medical History: Past Medical History:  Diagnosis Date   Asthma    WELL CONTROLLED   Fatty liver    Folliculitis 03/31/2016   GERD (gastroesophageal reflux disease)    Herpes simplex virus (HSV) infection 03/23/2009   Overview:  Herpes Simplex  10/1 IMO update   History of methicillin resistant staphylococcus aureus (MRSA) 2013   + PCR SCREEN   IBS (irritable bowel syndrome)    Migraine without aura, without mention of intractable migraine without mention of status migrainosus 06/05/2013   Obesity    Oligohydramnios    2013   Pre-eclampsia  2013   Preeclampsia 01/05/2016   Pregnancy induced hypertension    Wrist joint pain 01/09/2017     Vital Signs: There were no vitals taken for this visit.  ROS: As per HPI.  All other pertinent ROS negative.     Review of Systems  Constitutional:  Positive for fatigue.  Respiratory:  Positive for cough and shortness of breath. Negative for wheezing.     Physical Exam Constitutional:      Appearance: She is obese.  Pulmonary:     Effort: Pulmonary effort is normal.     Breath sounds: Decreased air movement present. Decreased breath  sounds present. No wheezing, rhonchi or rales.  Neurological:     Mental Status: She is alert.     No results found for this or any previous visit (from the past 24 hours).  Assessment/Plan: 1. Acute cough (Primary) Intermittent coughing over the past 3 to 4 weeks.  Treated with Augmentin, azithromycin and prednisone.  Symptoms start to improve and appeared to rebound.  She does have history of mild asthma and uses Advair twice daily.  Uses albuterol inhaler as needed.  She had to use it twice today due to severe coughing spells.  While in clinic, she was given a nebulizer treatment and instructed to go have a chest x-ray.  I will call her with the results.  Previously, she has seen both ENT and pulmonology.  If no improvement, would recommend follow-up with both.  - DG Chest 2 View; Future - CBC with Differential/Platelet - Comprehensive metabolic panel   General Counseling: Naijah verbalizes understanding of the findings of todays visit and agrees with plan of treatment. I have discussed any further diagnostic evaluation that may be needed or ordered today. We also reviewed her medications today. she has been encouraged to call the office with any questions or concerns that should arise related to todays visit.   No orders of the defined types were placed in this encounter.   No orders of the defined types were placed in this encounter.   I spent 25 minutes dedicated to the care of this patient (face-to-face and non-face-to-face) on the date of the encounter to include what is described in the assessment and plan.   Durenda Hurt, NP 03/28/2023 1:27 PM

## 2023-03-29 ENCOUNTER — Encounter: Payer: Self-pay | Admitting: Oncology

## 2023-03-29 DIAGNOSIS — M531 Cervicobrachial syndrome: Secondary | ICD-10-CM | POA: Diagnosis not present

## 2023-03-29 DIAGNOSIS — M530 Cervicocranial syndrome: Secondary | ICD-10-CM | POA: Diagnosis not present

## 2023-03-29 DIAGNOSIS — M9901 Segmental and somatic dysfunction of cervical region: Secondary | ICD-10-CM | POA: Diagnosis not present

## 2023-03-29 DIAGNOSIS — M9902 Segmental and somatic dysfunction of thoracic region: Secondary | ICD-10-CM | POA: Diagnosis not present

## 2023-03-29 LAB — CBC WITH DIFFERENTIAL/PLATELET
Basophils Absolute: 0 10*3/uL (ref 0.0–0.2)
Basos: 1 %
EOS (ABSOLUTE): 0.2 10*3/uL (ref 0.0–0.4)
Eos: 2 %
Hematocrit: 40.5 % (ref 34.0–46.6)
Hemoglobin: 13.4 g/dL (ref 11.1–15.9)
Immature Grans (Abs): 0 10*3/uL (ref 0.0–0.1)
Immature Granulocytes: 0 %
Lymphocytes Absolute: 2.5 10*3/uL (ref 0.7–3.1)
Lymphs: 29 %
MCH: 30.7 pg (ref 26.6–33.0)
MCHC: 33.1 g/dL (ref 31.5–35.7)
MCV: 93 fL (ref 79–97)
Monocytes Absolute: 0.5 10*3/uL (ref 0.1–0.9)
Monocytes: 6 %
Neutrophils Absolute: 5.5 10*3/uL (ref 1.4–7.0)
Neutrophils: 62 %
Platelets: 264 10*3/uL (ref 150–450)
RBC: 4.36 x10E6/uL (ref 3.77–5.28)
RDW: 12.4 % (ref 11.7–15.4)
WBC: 8.8 10*3/uL (ref 3.4–10.8)

## 2023-03-29 LAB — COMPREHENSIVE METABOLIC PANEL
ALT: 39 IU/L — ABNORMAL HIGH (ref 0–32)
AST: 39 IU/L (ref 0–40)
Albumin: 4 g/dL (ref 3.9–4.9)
Alkaline Phosphatase: 99 IU/L (ref 44–121)
BUN/Creatinine Ratio: 10 (ref 9–23)
BUN: 9 mg/dL (ref 6–24)
Bilirubin Total: 0.4 mg/dL (ref 0.0–1.2)
CO2: 23 mmol/L (ref 20–29)
Calcium: 8.6 mg/dL — ABNORMAL LOW (ref 8.7–10.2)
Chloride: 103 mmol/L (ref 96–106)
Creatinine, Ser: 0.87 mg/dL (ref 0.57–1.00)
Globulin, Total: 2.5 g/dL (ref 1.5–4.5)
Glucose: 83 mg/dL (ref 70–99)
Potassium: 4.4 mmol/L (ref 3.5–5.2)
Sodium: 140 mmol/L (ref 134–144)
Total Protein: 6.5 g/dL (ref 6.0–8.5)
eGFR: 86 mL/min/{1.73_m2} (ref >59)

## 2023-04-03 ENCOUNTER — Other Ambulatory Visit: Payer: Self-pay | Admitting: Oncology

## 2023-04-03 MED ORDER — ALBUTEROL SULFATE (2.5 MG/3ML) 0.083% IN NEBU: 2.5000 mg | INHALATION_SOLUTION | Freq: Four times a day (QID) | RESPIRATORY_TRACT | 1 refills | Status: AC | PRN

## 2023-04-25 DIAGNOSIS — R7309 Other abnormal glucose: Secondary | ICD-10-CM | POA: Diagnosis not present

## 2023-04-25 DIAGNOSIS — M9901 Segmental and somatic dysfunction of cervical region: Secondary | ICD-10-CM | POA: Diagnosis not present

## 2023-04-25 DIAGNOSIS — M531 Cervicobrachial syndrome: Secondary | ICD-10-CM | POA: Diagnosis not present

## 2023-04-25 DIAGNOSIS — M530 Cervicocranial syndrome: Secondary | ICD-10-CM | POA: Diagnosis not present

## 2023-04-25 DIAGNOSIS — M9902 Segmental and somatic dysfunction of thoracic region: Secondary | ICD-10-CM | POA: Diagnosis not present

## 2023-05-15 DIAGNOSIS — R6882 Decreased libido: Secondary | ICD-10-CM | POA: Diagnosis not present

## 2023-05-15 DIAGNOSIS — G4700 Insomnia, unspecified: Secondary | ICD-10-CM | POA: Diagnosis not present

## 2023-05-23 DIAGNOSIS — M9902 Segmental and somatic dysfunction of thoracic region: Secondary | ICD-10-CM | POA: Diagnosis not present

## 2023-05-23 DIAGNOSIS — M9901 Segmental and somatic dysfunction of cervical region: Secondary | ICD-10-CM | POA: Diagnosis not present

## 2023-05-23 DIAGNOSIS — M530 Cervicocranial syndrome: Secondary | ICD-10-CM | POA: Diagnosis not present

## 2023-05-23 DIAGNOSIS — M531 Cervicobrachial syndrome: Secondary | ICD-10-CM | POA: Diagnosis not present

## 2023-05-25 DIAGNOSIS — R7309 Other abnormal glucose: Secondary | ICD-10-CM | POA: Diagnosis not present

## 2023-06-21 DIAGNOSIS — M531 Cervicobrachial syndrome: Secondary | ICD-10-CM | POA: Diagnosis not present

## 2023-06-21 DIAGNOSIS — M530 Cervicocranial syndrome: Secondary | ICD-10-CM | POA: Diagnosis not present

## 2023-06-21 DIAGNOSIS — M9902 Segmental and somatic dysfunction of thoracic region: Secondary | ICD-10-CM | POA: Diagnosis not present

## 2023-06-21 DIAGNOSIS — M9901 Segmental and somatic dysfunction of cervical region: Secondary | ICD-10-CM | POA: Diagnosis not present

## 2023-06-25 DIAGNOSIS — R7309 Other abnormal glucose: Secondary | ICD-10-CM | POA: Diagnosis not present

## 2023-07-25 DIAGNOSIS — M9902 Segmental and somatic dysfunction of thoracic region: Secondary | ICD-10-CM | POA: Diagnosis not present

## 2023-07-25 DIAGNOSIS — M9901 Segmental and somatic dysfunction of cervical region: Secondary | ICD-10-CM | POA: Diagnosis not present

## 2023-07-25 DIAGNOSIS — R7309 Other abnormal glucose: Secondary | ICD-10-CM | POA: Diagnosis not present

## 2023-07-25 DIAGNOSIS — M531 Cervicobrachial syndrome: Secondary | ICD-10-CM | POA: Diagnosis not present

## 2023-07-25 DIAGNOSIS — M530 Cervicocranial syndrome: Secondary | ICD-10-CM | POA: Diagnosis not present

## 2023-08-08 ENCOUNTER — Encounter: Payer: Self-pay | Admitting: Family

## 2023-08-08 ENCOUNTER — Other Ambulatory Visit: Payer: Self-pay

## 2023-08-08 ENCOUNTER — Ambulatory Visit: Payer: Self-pay | Admitting: Family

## 2023-08-08 VITALS — BP 135/77 | HR 88 | Temp 98.1°F | Ht 68.0 in | Wt 260.0 lb

## 2023-08-08 DIAGNOSIS — Z299 Encounter for prophylactic measures, unspecified: Secondary | ICD-10-CM

## 2023-08-08 DIAGNOSIS — J069 Acute upper respiratory infection, unspecified: Secondary | ICD-10-CM

## 2023-08-08 MED ORDER — AMOXICILLIN-POT CLAVULANATE 875-125 MG PO TABS: 1.0000 | ORAL_TABLET | Freq: Two times a day (BID) | ORAL | 0 refills | Status: DC

## 2023-08-08 MED ORDER — FLUCONAZOLE 150 MG PO TABS: 150.0000 mg | ORAL_TABLET | Freq: Once | ORAL | 0 refills | Status: AC

## 2023-08-08 NOTE — Progress Notes (Signed)
 Subjective:     Sonia Martinez is a 43 y.o. female who presents for evaluation of symptoms of a URI, possible sinusitis. Symptoms include congestion, cough described as productive and worsening over time, headache described as dull sinus, productive cough with  white and yellow colored sputum, and purulent nasal discharge. Onset of symptoms was 6 week ago, and has been gradually worsening since that time. Treatment to date: cough suppressants and decongestants.  The following portions of the patient's history were reviewed and updated as appropriate: allergies, current medications, past family history, past medical history, past social history, past surgical history, and problem list.  Review of Systems Pertinent items are noted in HPI.   Objective:    Head: Normocephalic, without obvious abnormality, atraumatic, sinuses nontender to percussion Lungs: wheezes base - bilateral Lymph nodes: Cervical adenopathy: mild   Assessment:    Bacterial URI  Plan:    Discussed diagnosis and treatment of URI. Suggested symptomatic OTC remedies. Nasal saline spray for congestion. Follow up as needed. Keep appointment with ENT. Will order Augmentin  and Diflucan 

## 2023-08-22 DIAGNOSIS — M530 Cervicocranial syndrome: Secondary | ICD-10-CM | POA: Diagnosis not present

## 2023-08-22 DIAGNOSIS — M9902 Segmental and somatic dysfunction of thoracic region: Secondary | ICD-10-CM | POA: Diagnosis not present

## 2023-08-22 DIAGNOSIS — M531 Cervicobrachial syndrome: Secondary | ICD-10-CM | POA: Diagnosis not present

## 2023-08-22 DIAGNOSIS — M9901 Segmental and somatic dysfunction of cervical region: Secondary | ICD-10-CM | POA: Diagnosis not present

## 2023-08-25 DIAGNOSIS — R7309 Other abnormal glucose: Secondary | ICD-10-CM | POA: Diagnosis not present

## 2023-09-01 ENCOUNTER — Telehealth: Admitting: Nurse Practitioner

## 2023-09-01 DIAGNOSIS — J45901 Unspecified asthma with (acute) exacerbation: Secondary | ICD-10-CM | POA: Diagnosis not present

## 2023-09-01 DIAGNOSIS — T3695XA Adverse effect of unspecified systemic antibiotic, initial encounter: Secondary | ICD-10-CM

## 2023-09-01 DIAGNOSIS — J40 Bronchitis, not specified as acute or chronic: Secondary | ICD-10-CM | POA: Diagnosis not present

## 2023-09-01 DIAGNOSIS — B379 Candidiasis, unspecified: Secondary | ICD-10-CM

## 2023-09-01 DIAGNOSIS — J069 Acute upper respiratory infection, unspecified: Secondary | ICD-10-CM | POA: Diagnosis not present

## 2023-09-01 MED ORDER — FLUCONAZOLE 150 MG PO TABS: 150.0000 mg | ORAL_TABLET | Freq: Once | ORAL | 0 refills | Status: AC

## 2023-09-01 MED ORDER — BENZONATATE 100 MG PO CAPS: 100.0000 mg | ORAL_CAPSULE | Freq: Three times a day (TID) | ORAL | 0 refills | Status: DC | PRN

## 2023-09-01 MED ORDER — AZITHROMYCIN 250 MG PO TABS: ORAL_TABLET | ORAL | 0 refills | Status: AC

## 2023-09-01 MED ORDER — PREDNISONE 20 MG PO TABS: 20.0000 mg | ORAL_TABLET | Freq: Two times a day (BID) | ORAL | 0 refills | Status: AC

## 2023-09-01 MED ORDER — ALBUTEROL SULFATE HFA 108 (90 BASE) MCG/ACT IN AERS: 2.0000 | INHALATION_SPRAY | Freq: Four times a day (QID) | RESPIRATORY_TRACT | 0 refills | Status: DC | PRN

## 2023-09-01 NOTE — Progress Notes (Signed)
 E-Visit for Cough   We are sorry that you are not feeling well.  Here is how we plan to help!  Based on your presentation I believe you most likely have A cough due to bacteria.  When patients have a fever and a productive cough with a change in color or increased sputum production, we are concerned about bacterial bronchitis.  If left untreated it can progress to pneumonia.  If your symptoms do not improve with your treatment plan it is important that you contact your provider.   I have prescribed Azithromyin 250 mg: two tablets now and then one tablet daily for 4 additonal days    In addition you may use A prescription cough medication called Tessalon  Perles 100mg . You may take 1-2 capsules every 8 hours as needed for your cough.  We will also prescribe a short burst of prednisone  and refill your inhaler to assure you have enough to complete this treatment period  Meds ordered this encounter  Medications   azithromycin  (ZITHROMAX ) 250 MG tablet    Sig: Take 2 tablets on day 1, then 1 tablet daily on days 2 through 5    Dispense:  6 tablet    Refill:  0   benzonatate  (TESSALON ) 100 MG capsule    Sig: Take 1 capsule (100 mg total) by mouth 3 (three) times daily as needed.    Dispense:  30 capsule    Refill:  0   predniSONE  (DELTASONE ) 20 MG tablet    Sig: Take 1 tablet (20 mg total) by mouth 2 (two) times daily with a meal for 5 days.    Dispense:  10 tablet    Refill:  0   albuterol  (VENTOLIN  HFA) 108 (90 Base) MCG/ACT inhaler    Sig: Inhale 2 puffs into the lungs every 6 (six) hours as needed for wheezing or shortness of breath.    Dispense:  8 g    Refill:  0      From your responses in the eVisit questionnaire you describe inflammation in the upper respiratory tract which is causing a significant cough.  This is commonly called Bronchitis and has four common causes:   Allergies Viral Infections Acid Reflux Bacterial Infection Allergies, viruses and acid reflux are treated by  controlling symptoms or eliminating the cause. An example might be a cough caused by taking certain blood pressure medications. You stop the cough by changing the medication. Another example might be a cough caused by acid reflux. Controlling the reflux helps control the cough.  USE OF BRONCHODILATOR (RESCUE) INHALERS: There is a risk from using your bronchodilator too frequently.  The risk is that over-reliance on a medication which only relaxes the muscles surrounding the breathing tubes can reduce the effectiveness of medications prescribed to reduce swelling and congestion of the tubes themselves.  Although you feel brief relief from the bronchodilator inhaler, your asthma may actually be worsening with the tubes becoming more swollen and filled with mucus.  This can delay other crucial treatments, such as oral steroid medications. If you need to use a bronchodilator inhaler daily, several times per day, you should discuss this with your provider.  There are probably better treatments that could be used to keep your asthma under control.     HOME CARE Only take medications as instructed by your medical team. Complete the entire course of an antibiotic. Drink plenty of fluids and get plenty of rest. Avoid close contacts especially the very young and the elderly Cover  your mouth if you cough or cough into your sleeve. Always remember to wash your hands A steam or ultrasonic humidifier can help congestion.   GET HELP RIGHT AWAY IF: You develop worsening fever. You become short of breath You cough up blood. Your symptoms persist after you have completed your treatment plan MAKE SURE YOU  Understand these instructions. Will watch your condition. Will get help right away if you are not doing well or get worse.    Thank you for choosing an e-visit.  Your e-visit answers were reviewed by a board certified advanced clinical practitioner to complete your personal care plan. Depending upon the  condition, your plan could have included both over the counter or prescription medications.  Please review your pharmacy choice. Make sure the pharmacy is open so you can pick up prescription now. If there is a problem, you may contact your provider through [employer] and have the prescription routed to another pharmacy.  Your safety is important to us . If you have drug allergies check your prescription carefully.   For the next 24 hours you can use MyChart to ask questions about today's visit, request a non-urgent call back, or ask for a work or school excuse. You will get an email in the next two days asking about your experience. I hope that your e-visit has been valuable and will speed your recovery.  I spent approximately 5 minutes reviewing the patient's history, current symptoms and coordinating their care today.

## 2023-09-01 NOTE — Addendum Note (Signed)
 Addended by: KENNYTH LAURAINE BRAVO on: 09/01/2023 05:57 PM   Modules accepted: Orders

## 2023-09-19 DIAGNOSIS — M9902 Segmental and somatic dysfunction of thoracic region: Secondary | ICD-10-CM | POA: Diagnosis not present

## 2023-09-19 DIAGNOSIS — M531 Cervicobrachial syndrome: Secondary | ICD-10-CM | POA: Diagnosis not present

## 2023-09-19 DIAGNOSIS — M9901 Segmental and somatic dysfunction of cervical region: Secondary | ICD-10-CM | POA: Diagnosis not present

## 2023-09-19 DIAGNOSIS — M530 Cervicocranial syndrome: Secondary | ICD-10-CM | POA: Diagnosis not present

## 2023-09-21 DIAGNOSIS — L700 Acne vulgaris: Secondary | ICD-10-CM | POA: Diagnosis not present

## 2023-09-22 DIAGNOSIS — J301 Allergic rhinitis due to pollen: Secondary | ICD-10-CM | POA: Diagnosis not present

## 2023-09-22 DIAGNOSIS — B002 Herpesviral gingivostomatitis and pharyngotonsillitis: Secondary | ICD-10-CM | POA: Diagnosis not present

## 2023-09-22 DIAGNOSIS — J4541 Moderate persistent asthma with (acute) exacerbation: Secondary | ICD-10-CM | POA: Diagnosis not present

## 2023-09-22 DIAGNOSIS — K219 Gastro-esophageal reflux disease without esophagitis: Secondary | ICD-10-CM | POA: Diagnosis not present

## 2023-09-25 DIAGNOSIS — R7309 Other abnormal glucose: Secondary | ICD-10-CM | POA: Diagnosis not present

## 2023-10-17 DIAGNOSIS — M531 Cervicobrachial syndrome: Secondary | ICD-10-CM | POA: Diagnosis not present

## 2023-10-17 DIAGNOSIS — M9901 Segmental and somatic dysfunction of cervical region: Secondary | ICD-10-CM | POA: Diagnosis not present

## 2023-10-17 DIAGNOSIS — M9902 Segmental and somatic dysfunction of thoracic region: Secondary | ICD-10-CM | POA: Diagnosis not present

## 2023-10-17 DIAGNOSIS — M530 Cervicocranial syndrome: Secondary | ICD-10-CM | POA: Diagnosis not present

## 2023-10-25 DIAGNOSIS — R7309 Other abnormal glucose: Secondary | ICD-10-CM | POA: Diagnosis not present

## 2023-11-13 ENCOUNTER — Telehealth: Admitting: Physician Assistant

## 2023-11-13 DIAGNOSIS — J208 Acute bronchitis due to other specified organisms: Secondary | ICD-10-CM

## 2023-11-13 DIAGNOSIS — B9689 Other specified bacterial agents as the cause of diseases classified elsewhere: Secondary | ICD-10-CM

## 2023-11-13 MED ORDER — BENZONATATE 100 MG PO CAPS: 100.0000 mg | ORAL_CAPSULE | Freq: Three times a day (TID) | ORAL | 0 refills | Status: AC | PRN

## 2023-11-13 MED ORDER — AZITHROMYCIN 250 MG PO TABS: ORAL_TABLET | ORAL | 0 refills | Status: AC

## 2023-11-13 NOTE — Progress Notes (Signed)
 We are sorry that you are not feeling well.  Here is how we plan to help!  Based on your presentation I believe you most likely have A cough due to bacteria.  When patients have a fever and a productive cough with a change in color or increased sputum production, we are concerned about bacterial bronchitis.  If left untreated it can progress to pneumonia.  If your symptoms do not improve with your treatment plan it is important that you contact your provider.   I have prescribed Azithromyin 250 mg: two tablets now and then one tablet daily for 4 additonal days    In addition you may use A non-prescription cough medication called Mucinex DM: take 2 tablets every 12 hours. and A prescription cough medication called Tessalon  Perles 100mg . You may take 1-2 capsules every 8 hours as needed for your cough.   From your responses in the eVisit questionnaire you describe inflammation in the upper respiratory tract which is causing a significant cough.  This is commonly called Bronchitis and has four common causes:   Allergies Viral Infections Acid Reflux Bacterial Infection Allergies, viruses and acid reflux are treated by controlling symptoms or eliminating the cause. An example might be a cough caused by taking certain blood pressure medications. You stop the cough by changing the medication. Another example might be a cough caused by acid reflux. Controlling the reflux helps control the cough.  USE OF BRONCHODILATOR (RESCUE) INHALERS: There is a risk from using your bronchodilator too frequently.  The risk is that over-reliance on a medication which only relaxes the muscles surrounding the breathing tubes can reduce the effectiveness of medications prescribed to reduce swelling and congestion of the tubes themselves.  Although you feel brief relief from the bronchodilator inhaler, your asthma may actually be worsening with the tubes becoming more swollen and filled with mucus.  This can delay other  crucial treatments, such as oral steroid medications. If you need to use a bronchodilator inhaler daily, several times per day, you should discuss this with your provider.  There are probably better treatments that could be used to keep your asthma under control.     HOME CARE Only take medications as instructed by your medical team. Complete the entire course of an antibiotic. Drink plenty of fluids and get plenty of rest. Avoid close contacts especially the very young and the elderly Cover your mouth if you cough or cough into your sleeve. Always remember to wash your hands A steam or ultrasonic humidifier can help congestion.   GET HELP RIGHT AWAY IF: You develop worsening fever. You become short of breath You cough up blood. Your symptoms persist after you have completed your treatment plan MAKE SURE YOU  Understand these instructions. Will watch your condition. Will get help right away if you are not doing well or get worse.  Your e-visit answers were reviewed by a board certified advanced clinical practitioner to complete your personal care plan.  Depending on the condition, your plan could have included both over the counter or prescription medications. If there is a problem please reply  once you have received a response from your provider. Your safety is important to us .  If you have drug allergies check your prescription carefully.    You can use MyChart to ask questions about today's visit, request a non-urgent call back, or ask for a work or school excuse for 24 hours related to this e-Visit. If it has been greater than 24 hours  will need to follow up with your provider, or enter a new e-Visit to address those concerns. You will get an e-mail in the next two days asking about your experience.  I hope that your e-visit has been valuable and will speed your recovery. Thank you for using e-visits.   I have spent 5 minutes in review of e-visit questionnaire, review and updating  patient chart, medical decision making and response to patient.   Delon CHRISTELLA Dickinson, PA-C

## 2023-11-14 DIAGNOSIS — M530 Cervicocranial syndrome: Secondary | ICD-10-CM | POA: Diagnosis not present

## 2023-11-14 DIAGNOSIS — M531 Cervicobrachial syndrome: Secondary | ICD-10-CM | POA: Diagnosis not present

## 2023-11-14 DIAGNOSIS — M9902 Segmental and somatic dysfunction of thoracic region: Secondary | ICD-10-CM | POA: Diagnosis not present

## 2023-11-14 DIAGNOSIS — M9901 Segmental and somatic dysfunction of cervical region: Secondary | ICD-10-CM | POA: Diagnosis not present

## 2023-11-21 DIAGNOSIS — B370 Candidal stomatitis: Secondary | ICD-10-CM | POA: Diagnosis not present

## 2023-11-25 DIAGNOSIS — R7309 Other abnormal glucose: Secondary | ICD-10-CM | POA: Diagnosis not present

## 2023-11-28 DIAGNOSIS — J4541 Moderate persistent asthma with (acute) exacerbation: Secondary | ICD-10-CM | POA: Diagnosis not present

## 2023-12-01 DIAGNOSIS — F430 Acute stress reaction: Secondary | ICD-10-CM | POA: Diagnosis not present

## 2023-12-06 DIAGNOSIS — F430 Acute stress reaction: Secondary | ICD-10-CM | POA: Diagnosis not present

## 2023-12-12 DIAGNOSIS — M531 Cervicobrachial syndrome: Secondary | ICD-10-CM | POA: Diagnosis not present

## 2023-12-12 DIAGNOSIS — M530 Cervicocranial syndrome: Secondary | ICD-10-CM | POA: Diagnosis not present

## 2023-12-12 DIAGNOSIS — M9901 Segmental and somatic dysfunction of cervical region: Secondary | ICD-10-CM | POA: Diagnosis not present

## 2023-12-12 DIAGNOSIS — M9902 Segmental and somatic dysfunction of thoracic region: Secondary | ICD-10-CM | POA: Diagnosis not present

## 2023-12-18 DIAGNOSIS — F430 Acute stress reaction: Secondary | ICD-10-CM | POA: Diagnosis not present

## 2024-01-02 ENCOUNTER — Telehealth: Admitting: Family Medicine

## 2024-01-02 DIAGNOSIS — J40 Bronchitis, not specified as acute or chronic: Secondary | ICD-10-CM

## 2024-01-02 MED ORDER — PROMETHAZINE-DM 6.25-15 MG/5ML PO SYRP: 5.0000 mL | ORAL_SOLUTION | Freq: Four times a day (QID) | ORAL | 0 refills | Status: DC | PRN

## 2024-01-02 MED ORDER — PREDNISONE 10 MG (21) PO TBPK: ORAL_TABLET | ORAL | 0 refills | Status: AC

## 2024-01-02 NOTE — Progress Notes (Signed)
 We are sorry that you are not feeling well.  Here is how we plan to help!  Based on your presentation I believe you most likely have A cough due to a virus.  This is called viral bronchitis and is best treated by rest, plenty of fluids and control of the cough.  You may use Ibuprofen  or Tylenol  as directed to help your symptoms.     In addition you may use A prescription cough medication called Tessalon  Perles 100mg . You may take 1-2 capsules every 8 hours as needed for your cough.  Prednisone  10 mg daily for 6 days (see taper instructions below)  From your responses in the eVisit questionnaire you describe inflammation in the upper respiratory tract which is causing a significant cough.  This is commonly called Bronchitis and has four common causes:   Allergies Viral Infections Acid Reflux Bacterial Infection Allergies, viruses and acid reflux are treated by controlling symptoms or eliminating the cause. An example might be a cough caused by taking certain blood pressure medications. You stop the cough by changing the medication. Another example might be a cough caused by acid reflux. Controlling the reflux helps control the cough.  USE OF BRONCHODILATOR (RESCUE) INHALERS: There is a risk from using your bronchodilator too frequently.  The risk is that over-reliance on a medication which only relaxes the muscles surrounding the breathing tubes can reduce the effectiveness of medications prescribed to reduce swelling and congestion of the tubes themselves.  Although you feel brief relief from the bronchodilator inhaler, your asthma may actually be worsening with the tubes becoming more swollen and filled with mucus.  This can delay other crucial treatments, such as oral steroid medications. If you need to use a bronchodilator inhaler daily, several times per day, you should discuss this with your provider.  There are probably better treatments that could be used to keep your asthma under control.      HOME CARE Only take medications as instructed by your medical team. Complete the entire course of an antibiotic. Drink plenty of fluids and get plenty of rest. Avoid close contacts especially the very young and the elderly Cover your mouth if you cough or cough into your sleeve. Always remember to wash your hands A steam or ultrasonic humidifier can help congestion.   GET HELP RIGHT AWAY IF: You develop worsening fever. You become short of breath You cough up blood. Your symptoms persist after you have completed your treatment plan MAKE SURE YOU  Understand these instructions. Will watch your condition. Will get help right away if you are not doing well or get worse.  Your e-visit answers were reviewed by a board certified advanced clinical practitioner to complete your personal care plan.  Depending on the condition, your plan could have included both over the counter or prescription medications. If there is a problem please reply  once you have received a response from your provider. Your safety is important to us .  If you have drug allergies check your prescription carefully.    You can use MyChart to ask questions about today's visit, request a non-urgent call back, or ask for a work or school excuse for 24 hours related to this e-Visit. If it has been greater than 24 hours you will need to follow up with your provider, or enter a new e-Visit to address those concerns. You will get an e-mail in the next two days asking about your experience.  I hope that your e-visit has been valuable  and will speed your recovery. Thank you for using e-visits.   I have spent 5 minutes in review of e-visit questionnaire, review and updating patient chart, medical decision making and response to patient.   Ruhama Lehew, FNP

## 2024-01-03 DIAGNOSIS — F430 Acute stress reaction: Secondary | ICD-10-CM | POA: Diagnosis not present

## 2024-01-04 DIAGNOSIS — Z6841 Body Mass Index (BMI) 40.0 and over, adult: Secondary | ICD-10-CM | POA: Diagnosis not present

## 2024-01-04 DIAGNOSIS — Z124 Encounter for screening for malignant neoplasm of cervix: Secondary | ICD-10-CM | POA: Diagnosis not present

## 2024-01-04 DIAGNOSIS — Z1151 Encounter for screening for human papillomavirus (HPV): Secondary | ICD-10-CM | POA: Diagnosis not present

## 2024-01-04 DIAGNOSIS — Z01419 Encounter for gynecological examination (general) (routine) without abnormal findings: Secondary | ICD-10-CM | POA: Diagnosis not present

## 2024-01-04 DIAGNOSIS — Z1231 Encounter for screening mammogram for malignant neoplasm of breast: Secondary | ICD-10-CM | POA: Diagnosis not present

## 2024-01-08 ENCOUNTER — Ambulatory Visit: Payer: Self-pay | Admitting: Medical

## 2024-01-08 DIAGNOSIS — J4541 Moderate persistent asthma with (acute) exacerbation: Secondary | ICD-10-CM

## 2024-01-08 DIAGNOSIS — Z8619 Personal history of other infectious and parasitic diseases: Secondary | ICD-10-CM

## 2024-01-08 DIAGNOSIS — J209 Acute bronchitis, unspecified: Secondary | ICD-10-CM

## 2024-01-08 MED ORDER — FLUCONAZOLE 150 MG PO TABS: 150.0000 mg | ORAL_TABLET | Freq: Once | ORAL | 0 refills | Status: AC

## 2024-01-08 MED ORDER — DOXYCYCLINE HYCLATE 100 MG PO TABS: 100.0000 mg | ORAL_TABLET | Freq: Two times a day (BID) | ORAL | 0 refills | Status: AC

## 2024-01-08 NOTE — Patient Instructions (Addendum)
-  Take complete course of antibiotics as prescribed.  Take with food.   -Use albuterol  inhaler or nebulizer treatment every 4 hours as needed for shortness of breath, wheezing, chest tightness or persistent cough. Consider using albuterol  before Advair  to open airways and allow better delivery of Advair  to lungs. -Continue Advair  and Singulair . -Continue Benzonatate  three time a day as needed for cough. -Consider adding Mucinex /Guaifenesin  for mucus-thinning effect. -Rest and stay well hydrated (by drinking water and other liquids).  -For your cough, use cough drops/throat lozenges and/or drink warm liquids (like tea with honey). -Schedule in-person visit with [facility], your primary care or an urgent care as needed for new/worsening symptoms (i.e. fever, increased shortness of breath) or if symptoms do not improve as discussed with prescribed/recommended treatment over next 3-5 days.

## 2024-01-08 NOTE — Progress Notes (Signed)
 [facility] [address] [place], [place] [address]   Office Visit Note  Patient Name: Sonia Martinez Date of Birth [id number]  Medical Record number [id number]  Date of Service: 01/08/2024  Chief Complaint  Patient presents with   Wheezing    Cough and chest congestion      43 y.o. female presents via telephone with ongoing cough, wheezing and chest congestion.  Patient expresses understanding of limitations of telehealth visit. Understands she has option to be seen in person if desired.  Cough, wheezing and chest congestion began 10/3 or 10/4. Initially used Mucinex  without much improvement. Wheezing worsened. Had some sore throat initially. No nasal congestion or runny nose. No fever, chills, myalgia, headache, nausea, vomiting or diarrhea. Some fatigue.  Had E-visit 01/02/24. Prescribed 6-day Prednisone  taper and Promethazine -DM syrup for viral bronchitis, on final day of this now. Feels physically a little better but still has chest congestion. Wheezing never fully resolved. Using Tessalon  perles TID. Advair  BID. Cough productive of mucus, thick yellow now, was more green. Less SOB since starting prednisone , though still some when active.  Still denies fever or chills.  A couple breathing treatments with albuterol  over last week. Rescue inhaler yesterday. Did not sleep well last week, some better last couple days.   Last in-person visit 09/21/23 with PCP. Advair  dose increased 11/28/23 by PCP for better asthma control. Last chest imaging 03/28/23 - normal chest xray.  Supposed to fly in 5 days to [facility].   Patient states she tends to have bronchitis in fall. Tends to develop vaginal yeast infection and/or oral thrush with antibiotics and steroids. Treated with Zpack in late October for bronchitis. Patient states Dextromethorphan tends to cause nausea.  Current Medication:  Outpatient Encounter Medications as of 01/08/2024  Medication Sig   albuterol   (PROAIR  HFA) 108 (90 Base) MCG/ACT inhaler Inhale 2 puffs into the lungs every 4 (four) hours as needed for wheezing or shortness of breath.   albuterol  (PROVENTIL ) (2.5 MG/3ML) 0.083% nebulizer solution Take 3 mLs (2.5 mg total) by nebulization every 6 (six) hours as needed for wheezing or shortness of breath.   Ascorbic Acid (VITAMIN C) 1000 MG tablet Take 500 mg by mouth daily.    Bacillus Coagulans-Inulin (PROBIOTIC-PREBIOTIC PO) Take 2 tablets by mouth daily.   benzonatate  (TESSALON ) 100 MG capsule Take 1-2 capsules (100-200 mg total) by mouth 3 (three) times daily as needed.   Cholecalciferol (VITAMIN D3) 125 MCG (5000 UT) CAPS Take 1 capsule by mouth daily.   fluticasone  (FLONASE ) 50 MCG/ACT nasal spray Place 2 sprays into both nostrils daily.   fluticasone -salmeterol (ADVAIR  HFA) 115-21 MCG/ACT inhaler Inhale 2 puffs into the lungs 2 (two) times daily.   levocetirizine (XYZAL ) 5 MG tablet Take 5 mg by mouth every evening.   LO LOESTRIN FE 1 MG-10 MCG / 10 MCG tablet Take 1 tablet by mouth daily.   Lysine 500 MG CAPS Take 1,000 mg by mouth daily.    Magnesium  400 MG CAPS Take by mouth.   montelukast  (SINGULAIR ) 10 MG tablet TAKE 1 TABLET BY MOUTH ONCE DAILY   Multiple Vitamin (MULTIVITAMIN) tablet Take 1 tablet by mouth daily.   Omega-3 Fatty Acids (FISH OIL) 1000 MG CAPS Take 1,000 mg by mouth daily.   omeprazole  (PRILOSEC) 20 MG capsule Take 1 capsule (20 mg total) by mouth daily.   predniSONE  (STERAPRED UNI-PAK 21 TAB) 10 MG (21) TBPK tablet Prednisone  10 mg- 6 day dose pack as directed- #1 no  refills.   promethazine -dextromethorphan (PROMETHAZINE -DM) 6.25-15 MG/5ML syrup Take 5 mLs by mouth 4 (four) times daily as needed for up to 10 days for cough.   valACYclovir  (VALTREX ) 1000 MG tablet Take 1 tablet (1,000 mg total) by mouth 2 (two) times daily as needed.   No facility-administered encounter medications on file as of 01/08/2024.      Medical History: Past Medical History:   Diagnosis Date   Asthma    WELL CONTROLLED   Fatty liver    Folliculitis 03/31/2016   GERD (gastroesophageal reflux disease)    Herpes simplex virus (HSV) infection 03/23/2009   Overview:  Herpes Simplex  10/1 IMO update   History of methicillin resistant staphylococcus aureus (MRSA) 2013   + PCR SCREEN   IBS (irritable bowel syndrome)    Migraine without aura, without mention of intractable migraine without mention of status migrainosus 06/05/2013   Obesity    Oligohydramnios    2013   Pre-eclampsia    2013   Preeclampsia 01/05/2016   Pregnancy induced hypertension    Wrist joint pain 01/09/2017     Vital Signs: There were no vitals taken for this visit. Visit conducted by phone.   Review of Systems  Constitutional:  Positive for fatigue. Negative for chills and fever.  HENT:  Positive for sore throat (briefly at onset of cough, now resolved). Negative for congestion, sinus pressure and sinus pain.   Respiratory:  Positive for cough, chest tightness, shortness of breath (improved with Prednisone ) and wheezing.   Cardiovascular:  Negative for chest pain and leg swelling.  Gastrointestinal:  Negative for diarrhea, nausea and vomiting.  Musculoskeletal:  Negative for myalgias.  Neurological:  Negative for headaches.    Physical Exam Patient sounds to be in no acute distress. Speaking in complete sentences. Intermittent coughing.  Assessment/Plan: 1. Acute bronchitis, unspecified organism (Primary) Viral versus bacterial infection. Sx onset ~12 days ago. Some improvement with prednisone  and cough suppressant but cough continues to be frequent and productive. Will add Doxycycline  twice daily x 7 days for possible secondary bacterial infection. Advised to monitor symptoms and follow up in person at our clinic, with PCP or at urgent care with new/worsening symptoms or if symptoms do not improve over next 3-5 days.  - doxycycline  (VIBRA -TABS) 100 MG tablet; Take 1 tablet (100 mg  total) by mouth 2 (two) times daily for 7 days. With food  Dispense: 14 tablet; Refill: 0  2. Moderate persistent asthma with acute exacerbation -Finish Prednisone  taper. -Discussed using albuterol  more often during asthma exacerbation, as often as every 4 hours via nebulizer or inhaler. At minimum, consider using albuterol  prior to taking Advair  to improve delivery of control meds. -Continue Advair  and Singulair  as prescribed.  3. History of candidiasis Will give Fluconazole  to use as needed for vaginal or oral yeast symptoms.  - fluconazole  (DIFLUCAN ) 150 MG tablet; Take 1 tablet (150 mg total) by mouth once for 1 dose. As needed for yeast infection. May take second dose after 3-4 days if symptoms not resolved.  Dispense: 1 tablet; Refill: 0   Patient Instructions  -Take complete course of antibiotics as prescribed.  Take with food.   -Use albuterol  inhaler or nebulizer treatment every 4 hours as needed for shortness of breath, wheezing, chest tightness or persistent cough. Consider using albuterol  before Advair  to open airways and allow better delivery of Advair  to lungs. -Continue Advair  and Singulair . -Continue Benzonatate  three time a day as needed for cough. -Consider adding Mucinex /Guaifenesin  for mucus-thinning  effect. -Rest and stay well hydrated (by drinking water and other liquids).  -For your cough, use cough drops/throat lozenges and/or drink warm liquids (like tea with honey). -Schedule in-person visit with [facility], your primary care or an urgent care as needed for new/worsening symptoms (i.e. fever, increased shortness of breath) or if symptoms do not improve as discussed with prescribed/recommended treatment over next 3-5 days.      General Counseling: anna beaird understanding of the findings of todays visit and plan of treatment. she has been encouraged to call the office with any questions or concerns that should arise related to todays  visit.    Time spent:25 Minutes    Joen Arts PA-C Physician Assistant

## 2024-01-09 DIAGNOSIS — M9902 Segmental and somatic dysfunction of thoracic region: Secondary | ICD-10-CM | POA: Diagnosis not present

## 2024-01-09 DIAGNOSIS — M531 Cervicobrachial syndrome: Secondary | ICD-10-CM | POA: Diagnosis not present

## 2024-01-09 DIAGNOSIS — M9901 Segmental and somatic dysfunction of cervical region: Secondary | ICD-10-CM | POA: Diagnosis not present

## 2024-01-09 DIAGNOSIS — M530 Cervicocranial syndrome: Secondary | ICD-10-CM | POA: Diagnosis not present

## 2024-01-19 DIAGNOSIS — B379 Candidiasis, unspecified: Secondary | ICD-10-CM | POA: Diagnosis not present

## 2024-01-19 DIAGNOSIS — J4541 Moderate persistent asthma with (acute) exacerbation: Secondary | ICD-10-CM | POA: Diagnosis not present

## 2024-01-19 DIAGNOSIS — T3695XA Adverse effect of unspecified systemic antibiotic, initial encounter: Secondary | ICD-10-CM | POA: Diagnosis not present

## 2024-01-19 DIAGNOSIS — J40 Bronchitis, not specified as acute or chronic: Secondary | ICD-10-CM | POA: Diagnosis not present

## 2024-01-23 DIAGNOSIS — R053 Chronic cough: Secondary | ICD-10-CM | POA: Diagnosis not present

## 2024-02-14 ENCOUNTER — Other Ambulatory Visit: Payer: Self-pay | Admitting: Emergency Medicine

## 2024-02-14 DIAGNOSIS — R053 Chronic cough: Secondary | ICD-10-CM

## 2024-02-14 DIAGNOSIS — R0609 Other forms of dyspnea: Secondary | ICD-10-CM

## 2024-02-14 DIAGNOSIS — J4540 Moderate persistent asthma, uncomplicated: Secondary | ICD-10-CM

## 2024-02-26 ENCOUNTER — Ambulatory Visit
